# Patient Record
Sex: Male | Born: 1955 | Race: White | Hispanic: No | Marital: Married | Smoking: Never smoker
Health system: Southern US, Community
[De-identification: ages and names within clinical notes are randomized; demographics above are authoritative.]

## PROBLEM LIST (undated history)

## (undated) DIAGNOSIS — F32A Depression, unspecified: Secondary | ICD-10-CM

## (undated) DIAGNOSIS — R011 Cardiac murmur, unspecified: Secondary | ICD-10-CM

## (undated) DIAGNOSIS — F329 Major depressive disorder, single episode, unspecified: Secondary | ICD-10-CM

## (undated) DIAGNOSIS — E785 Hyperlipidemia, unspecified: Secondary | ICD-10-CM

## (undated) DIAGNOSIS — E119 Type 2 diabetes mellitus without complications: Secondary | ICD-10-CM

## (undated) DIAGNOSIS — I10 Essential (primary) hypertension: Secondary | ICD-10-CM

## (undated) DIAGNOSIS — T7840XA Allergy, unspecified, initial encounter: Secondary | ICD-10-CM

## (undated) HISTORY — DX: Essential (primary) hypertension: I10

## (undated) HISTORY — DX: Allergy, unspecified, initial encounter: T78.40XA

## (undated) HISTORY — PX: TONSILLECTOMY: SUR1361

## (undated) HISTORY — DX: Cardiac murmur, unspecified: R01.1

## (undated) HISTORY — DX: Hyperlipidemia, unspecified: E78.5

## (undated) HISTORY — DX: Depression, unspecified: F32.A

## (undated) HISTORY — DX: Major depressive disorder, single episode, unspecified: F32.9

## (undated) HISTORY — PX: OTHER SURGICAL HISTORY: SHX169

---

## 1995-04-05 HISTORY — PX: MENISCUS REPAIR: SHX5179

## 2001-11-23 ENCOUNTER — Encounter: Payer: Self-pay | Admitting: Otolaryngology

## 2001-11-23 ENCOUNTER — Ambulatory Visit (HOSPITAL_COMMUNITY): Admission: RE | Admit: 2001-11-23 | Discharge: 2001-11-23 | Payer: Self-pay | Admitting: Otolaryngology

## 2006-04-26 ENCOUNTER — Ambulatory Visit (HOSPITAL_COMMUNITY): Payer: Self-pay | Admitting: Psychiatry

## 2006-05-02 ENCOUNTER — Ambulatory Visit: Payer: Self-pay | Admitting: Psychiatry

## 2006-05-02 ENCOUNTER — Other Ambulatory Visit (HOSPITAL_COMMUNITY): Admission: RE | Admit: 2006-05-02 | Discharge: 2006-07-31 | Payer: Self-pay | Admitting: Psychiatry

## 2006-05-22 ENCOUNTER — Ambulatory Visit (HOSPITAL_COMMUNITY): Payer: Self-pay | Admitting: Psychiatry

## 2006-06-05 ENCOUNTER — Ambulatory Visit (HOSPITAL_COMMUNITY): Payer: Self-pay | Admitting: Psychiatry

## 2006-07-05 ENCOUNTER — Ambulatory Visit (HOSPITAL_COMMUNITY): Payer: Self-pay | Admitting: Psychiatry

## 2006-10-27 ENCOUNTER — Encounter: Admission: RE | Admit: 2006-10-27 | Discharge: 2006-10-27 | Payer: Self-pay | Admitting: Otolaryngology

## 2011-09-15 ENCOUNTER — Other Ambulatory Visit: Payer: Self-pay | Admitting: Otolaryngology

## 2011-09-15 DIAGNOSIS — H9210 Otorrhea, unspecified ear: Secondary | ICD-10-CM

## 2011-09-15 DIAGNOSIS — H7010 Chronic mastoiditis, unspecified ear: Secondary | ICD-10-CM

## 2011-09-16 ENCOUNTER — Other Ambulatory Visit: Payer: Self-pay

## 2011-09-23 ENCOUNTER — Ambulatory Visit
Admission: RE | Admit: 2011-09-23 | Discharge: 2011-09-23 | Disposition: A | Discharge status: Home / Self Care | Payer: 59 | Source: Ambulatory Visit | Attending: Otolaryngology | Admitting: Otolaryngology

## 2011-09-23 DIAGNOSIS — H7010 Chronic mastoiditis, unspecified ear: Secondary | ICD-10-CM

## 2011-09-23 DIAGNOSIS — H9210 Otorrhea, unspecified ear: Secondary | ICD-10-CM

## 2011-11-10 ENCOUNTER — Other Ambulatory Visit: Payer: Self-pay

## 2012-08-21 ENCOUNTER — Telehealth: Payer: Self-pay | Admitting: *Deleted

## 2012-08-21 NOTE — Telephone Encounter (Signed)
Pt called, has some nasal drainage and congestion, denies fever or sob for 2-3 days. Pt to treat as possible virus, if worsen call back for app in the next day or 2. Pt agrees

## 2013-07-31 IMAGING — CT CT TEMPORAL BONES W/O CM
2 of 5 series · 16 of 30 positions shown, 18 images · non-contrast
Comparison: CT temporal bones 10/27/2006

CLINICAL DATA: Chronic mastoiditis.  Ear surgery.  Decreased
hearing left ear

CT TEMPORAL BONES WITHOUT CONTRAST
TECHNIQUE: Axial and coronal plane CT imaging of the petrous
temporal bones was performed with thin-collimation image
reconstruction.  No intravenous contrast was administered.
Multiplanar CT image reconstructions were also generated.

[Series 3: ax mag right · axial · 0.19mm/px · z∈[-17,+36]mm · 8 of 215 slices shown, 10 images]
[im 24/215  brain]
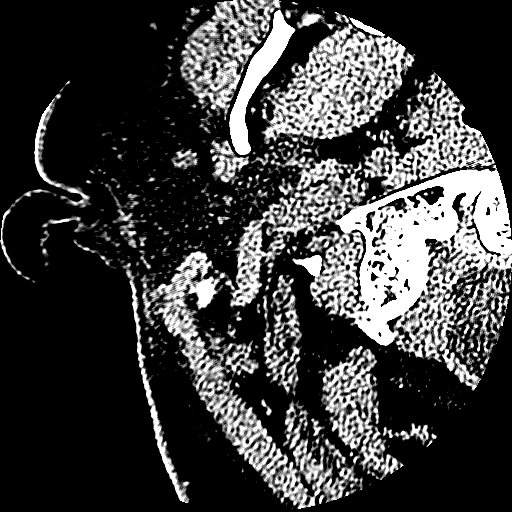
[im 24/215  bone]
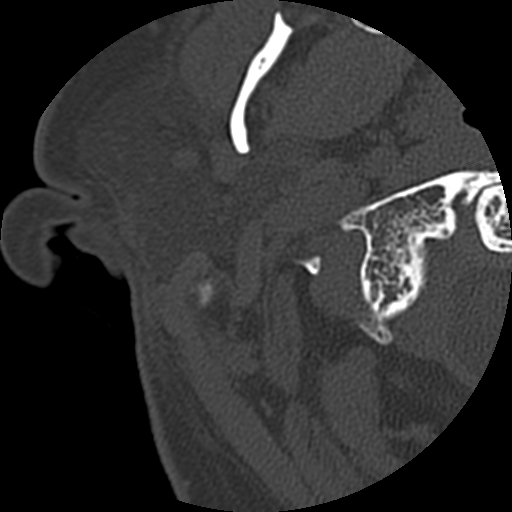
[im 48/215  bone]
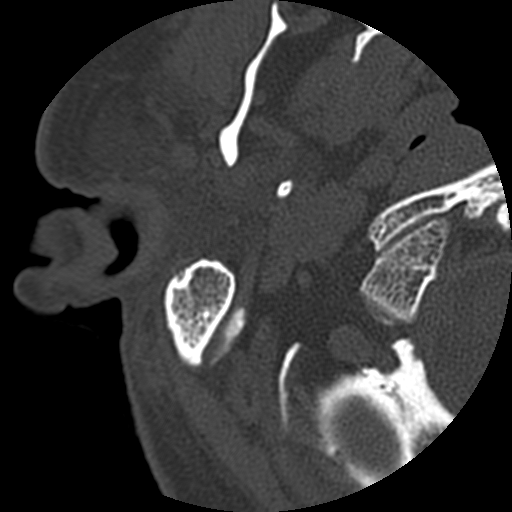
[im 72/215  bone]
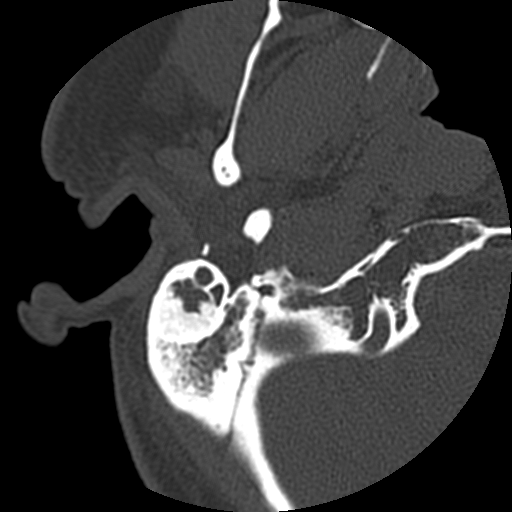
[im 96/215  bone]
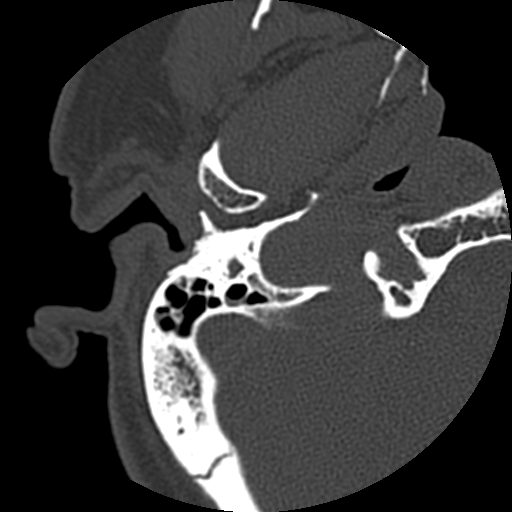
[im 119/215  brain]
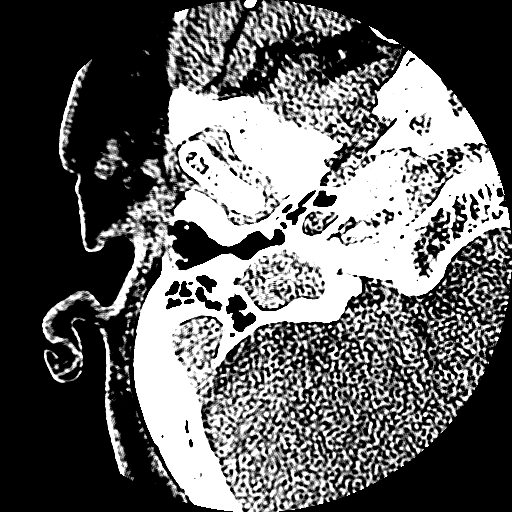
[im 119/215  bone]
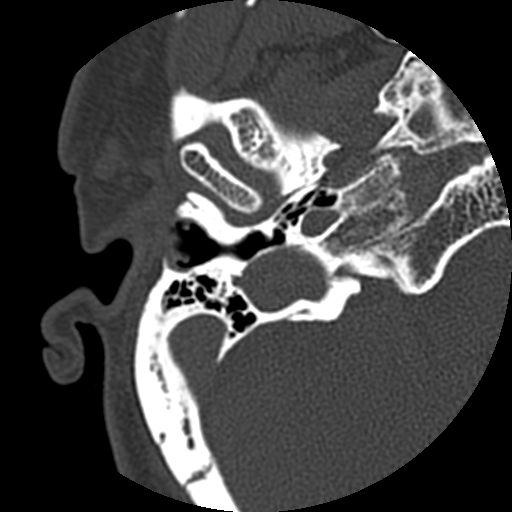
[im 143/215  bone]
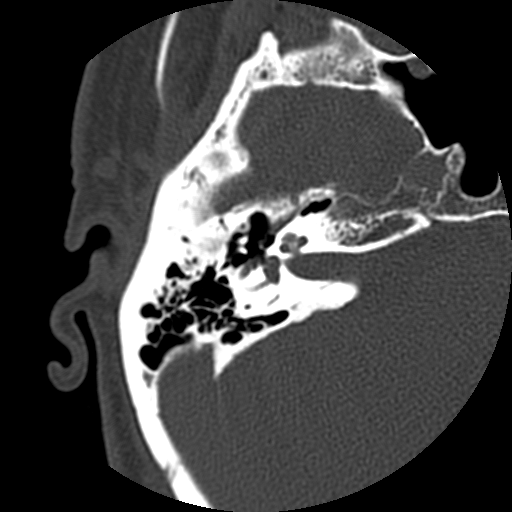
[im 167/215  bone]
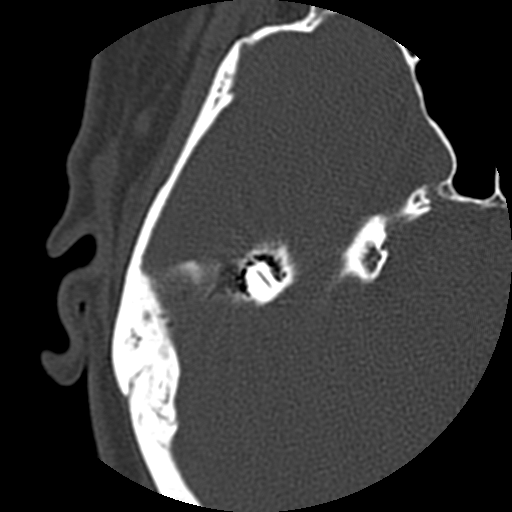
[im 191/215  bone]
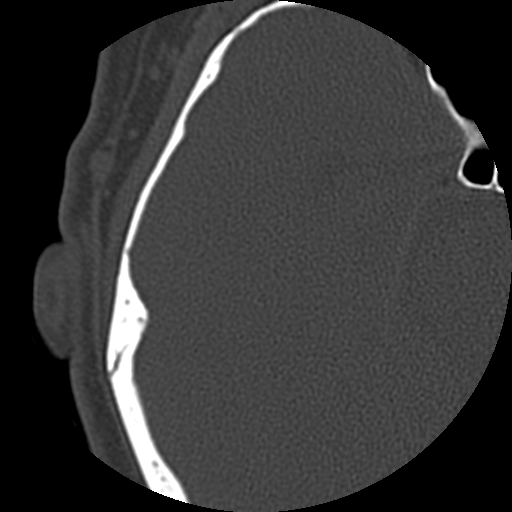

[Series 4: ax mag left · axial · 0.19mm/px · z∈[-17,+36]mm · 8 of 215 slices shown]
[im 24/215  bone]
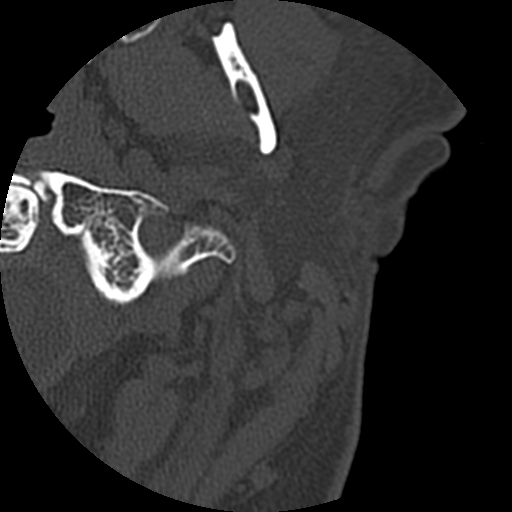
[im 48/215  bone]
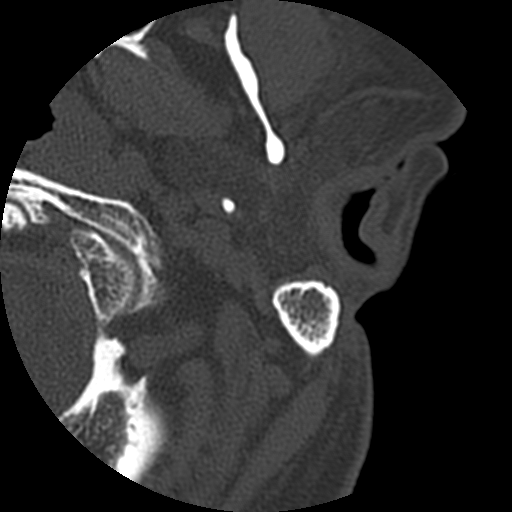
[im 72/215  bone]
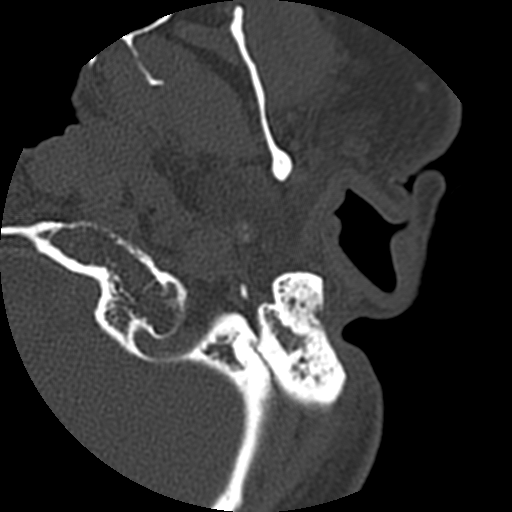
[im 96/215  bone]
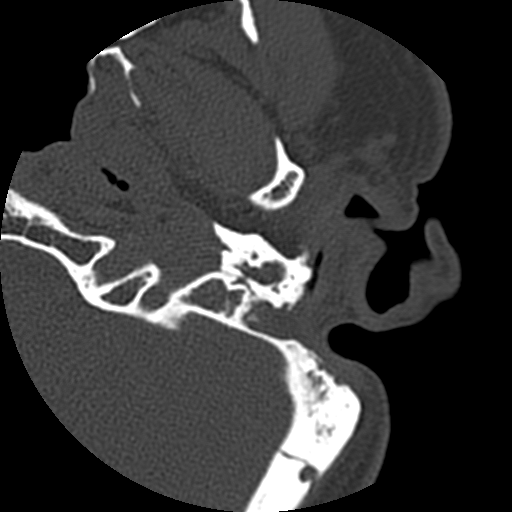
[im 119/215  bone]
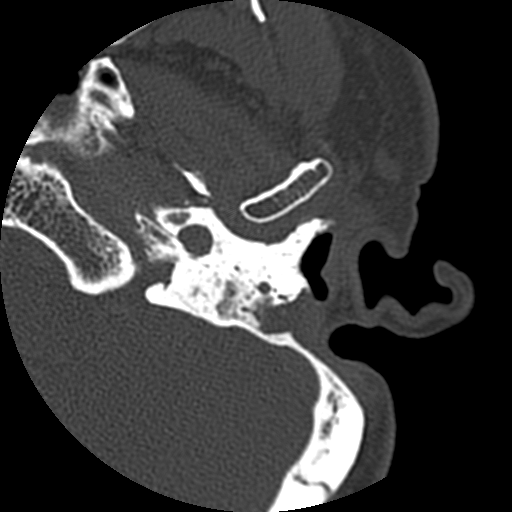
[im 143/215  bone]
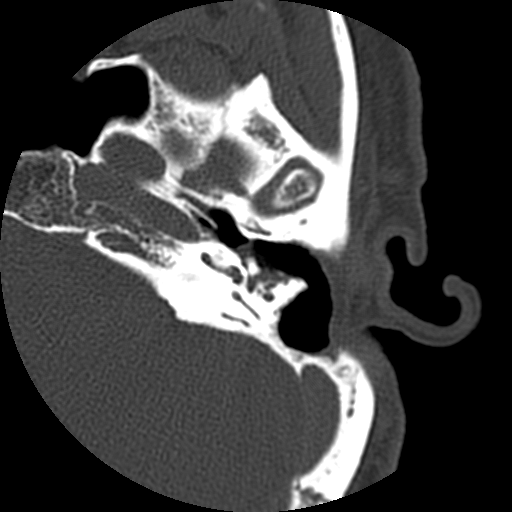
[im 167/215  bone]
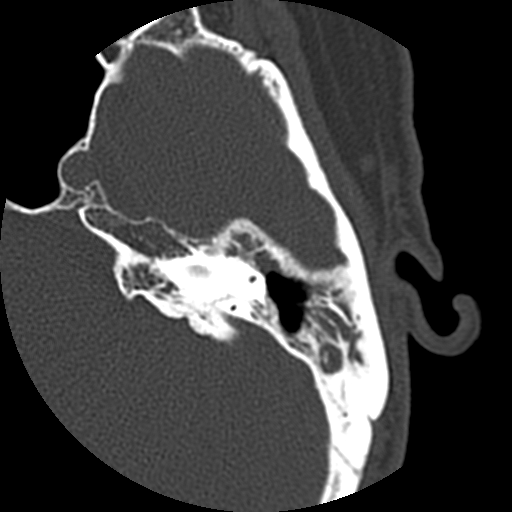
[im 191/215  bone]
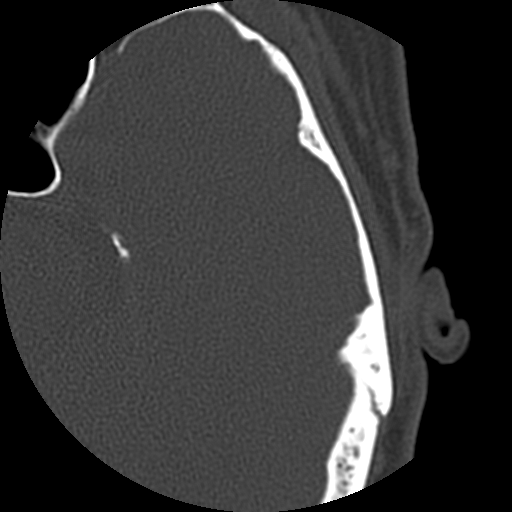

[16 of 30 positions shown; findings below may reference images not displayed]

FINDINGS: Visualized brain appears normal.  Visualized paranasal
sinuses show mild mucosal thickening in the ethmoid sinuses
bilaterally without air-fluid level.

Right temporal bone:  Opacification of air cells in the mastoid tip
on the right is unchanged and chronic.  The remainder of the right
mastoid sinus is well developed and clear.  The middle ear is well-
aerated and clear.  Ossicles are normal on the right.  Internal
auditory canal is normal.  Cochlea and vestibular apparatus is
normal.  Vestibular aqueduct is not enlarged.  No bony erosion.

Left temporal bone:  Interval mastoidectomy on the left.  There is
soft tissue density in the mastoidectomy cavity,  with a lobular
contour. This extends into the medial aspect of the mastoid sinus.
This also fills some of the lateral aspect of the mastoidectomy
cavity.  The mastoid tip is opacified.  No fluid level. Ossicles
are surrounded by soft tissue density and are distorted due to
surgery and inflammatory change.  Cochlea is normal.  Vestibular
apparatus and vestibular aqueduct are normal.
IMPRESSION: Interval left mastoidectomy since the prior CT.  There is soft
tissue filling the roof of the cavity extending medially and
laterally.  This may be chronic inflammatory tissue or
cholesteatoma.  There is opacification of the mastoid tip on the
left.

Mild mucosal thickening in the mastoid tip on the right, unchanged
from prior CT.  Right middle ear is clear.

## 2013-12-24 ENCOUNTER — Ambulatory Visit (INDEPENDENT_AMBULATORY_CARE_PROVIDER_SITE_OTHER): Payer: 59 | Admitting: Family Medicine

## 2013-12-24 ENCOUNTER — Encounter: Payer: Self-pay | Admitting: Family Medicine

## 2013-12-24 VITALS — BP 130/80 | Ht 73.0 in | Wt 230.8 lb

## 2013-12-24 DIAGNOSIS — J310 Chronic rhinitis: Secondary | ICD-10-CM

## 2013-12-24 DIAGNOSIS — J329 Chronic sinusitis, unspecified: Secondary | ICD-10-CM

## 2013-12-24 MED ORDER — AMOXICILLIN 500 MG PO CAPS
500.0000 mg | ORAL_CAPSULE | Freq: Three times a day (TID) | ORAL | Status: DC
Start: 2013-12-24 — End: 2014-06-24

## 2013-12-24 NOTE — Progress Notes (Signed)
   Subjective:    Patient ID: Thomas Summers, male    DOB: 1956-04-03, 58 y.o.   MRN: [id number]  Cough This is a new problem. The current episode started 1 to 4 weeks ago. Associated symptoms include nasal congestion. Associated symptoms comments: Diarrhea, chest tightness. Treatments tried: allegra.    Cold like symptoms drainage and feeling bad  achey and gunky  Diminished energy  No major headaches  Not much cough,, Loose stools   Allegra prn allergies re usually rnny nose an dsneezing this feels different  Review of Systems  Respiratory: Positive for cough.    no vomiting no diarrhea no rash ROS otherwise negative     Objective:   Physical Exam  Alert decent hydration mild malaise. Final stable. Flomax or tenderness pharynx some erythema neck supple. Lungs clear. Heart regular in rhythm.      Assessment & Plan:  Impression a 2 rhinosinusitis plan antibiotics prescribed. Symptomatic care discussed. WSL

## 2014-06-24 ENCOUNTER — Encounter: Payer: Self-pay | Admitting: Family Medicine

## 2014-06-24 ENCOUNTER — Ambulatory Visit (INDEPENDENT_AMBULATORY_CARE_PROVIDER_SITE_OTHER): Payer: 59 | Admitting: Family Medicine

## 2014-06-24 VITALS — BP 158/102 | Ht 73.0 in | Wt 233.0 lb

## 2014-06-24 DIAGNOSIS — Z13220 Encounter for screening for lipoid disorders: Secondary | ICD-10-CM

## 2014-06-24 DIAGNOSIS — Z125 Encounter for screening for malignant neoplasm of prostate: Secondary | ICD-10-CM

## 2014-06-24 DIAGNOSIS — Z79899 Other long term (current) drug therapy: Secondary | ICD-10-CM | POA: Diagnosis not present

## 2014-06-24 DIAGNOSIS — I10 Essential (primary) hypertension: Secondary | ICD-10-CM | POA: Insufficient documentation

## 2014-06-24 MED ORDER — LISINOPRIL 5 MG PO TABS: 5.0000 mg | ORAL_TABLET | Freq: Every day | ORAL | Status: DC

## 2014-06-24 NOTE — Patient Instructions (Addendum)
Dear Patient,  It has been recommended to you that you have a colonoscopy. It is your responsibility to carry through with this recommendation.   Did you realize that colon cancer is the second leading cancer killer in the [place]. One in every 20 adults will get colon cancer. If all adults would go through the recommended screening for colon cancer (getting a colonoscopy), then there would be a 60% reduction in the number of people dying from colon cancer.  Colon cancer just doesn't come out of the blue. It starts off as a small polyp which over time grows into a cancer. A colonoscopy can prevent cancer and in many cases detected when it is at a very treatable phase. Small colon cancers can have cure rates of 95%. Advanced colon cancer, which often occurs in people who do not do their screenings, have cure rates less than 20%. The risk of colon cancer advances with age. Most adults should have regular colonoscopies every 10 years starting at age 74. This recommendation can vary depending on a person's medical history.  Health-care laws now allow for you to call the gastroenterologist office directly in order to set yourself up for this very important tests. Today we have recommended to you that you do this test. This test may save your life. Failure to do this test puts you at risk for premature death from colon cancer. Do the right thing and schedule this test now.  Here as a list of specialists we recommend in the surrounding area. When you call their office let them know that you are a patient of our practice in your interested in doing a screening colonoscopy. They should assist you without problems. You will need the following information when you called them: 1-name of which Dr. you see, 2-your insurance information, 3-a list of medications that you currently take, 4-any allergies you have to medications.  [place] gastroenterologist Dr. Milton Ferguson, Dr Felicie Morn  gastroenterologist   [facility]   [phone]  [facility] gastroenterology (Dr. Garnetta Buddy and [place]) [phone]  [facility] gastroenterology (Dr. Leia Alf, Harrietta Guardian, [place]) [phone]  Each group of specialists has assured [place] that when you called them they will help you get your colonoscopy set up. Should you have problems please let us know. Be sure to call soon. Sincerely, Pearson Forster, Dr Mickie Hillier, Dr.Daran Favaro      DASH Eating Plan DASH stands for "Dietary Approaches to Stop Hypertension." The DASH eating plan is a healthy eating plan that has been shown to reduce high blood pressure (hypertension). Additional health benefits may include reducing the risk of type 2 diabetes mellitus, heart disease, and stroke. The DASH eating plan may also help with weight loss. WHAT DO I NEED TO KNOW ABOUT THE DASH EATING PLAN? For the DASH eating plan, you will follow these general guidelines:  Choose foods with a percent daily value for sodium of less than 5% (as listed on the food label).  Use salt-free seasonings or herbs instead of table salt or sea salt.  Check with your health care provider or pharmacist before using salt substitutes.  Eat lower-sodium products, often labeled as "lower sodium" or "no salt added."  Eat fresh foods.  Eat more vegetables, fruits, and low-fat dairy products.  Choose whole grains. Look for the word "whole" as the first word in the ingredient list.  Choose fish and skinless chicken or  [place] more often than red meat. Limit fish, poultry, and meat to 6 oz (170 g) each day.  Limit sweets, desserts, sugars, and sugary drinks.  Choose heart-healthy fats.  Limit cheese to 1 oz (28 g) per day.  Eat more home-cooked food and less restaurant, buffet, and fast food.  Limit fried foods.  Cook foods using methods other  than frying.  Limit canned vegetables. If you do use them, rinse them well to decrease the sodium.  When eating at a restaurant, ask that your food be prepared with less salt, or no salt if possible. WHAT FOODS CAN I EAT? Seek help from a dietitian for individual calorie needs. Grains Whole grain or whole wheat bread. Brown rice. Whole grain or whole wheat pasta. Quinoa, bulgur, and whole grain cereals. Low-sodium cereals. Corn or whole wheat flour tortillas. Whole grain cornbread. Whole grain crackers. Low-sodium crackers. Vegetables Fresh or frozen vegetables (raw, steamed, roasted, or grilled). Low-sodium or reduced-sodium tomato and vegetable juices. Low-sodium or reduced-sodium tomato sauce and paste. Low-sodium or reduced-sodium canned vegetables.  Fruits All fresh, canned (in natural juice), or frozen fruits. Meat and Other Protein Products Ground beef (85% or leaner), grass-fed beef, or beef trimmed of fat. Skinless chicken or [place]. Ground chicken or [place]. Pork trimmed of fat. All fish and seafood. Eggs. Dried beans, peas, or lentils. Unsalted nuts and seeds. Unsalted canned beans. Dairy Low-fat dairy products, such as skim or 1% milk, 2% or reduced-fat cheeses, low-fat ricotta or cottage cheese, or plain low-fat yogurt. Low-sodium or reduced-sodium cheeses. Fats and Oils Tub margarines without trans fats. Light or reduced-fat mayonnaise and salad dressings (reduced sodium). Avocado. Safflower, olive, or canola oils. Natural peanut or almond butter. Other Unsalted popcorn and pretzels. The items listed above may not be a complete list of recommended foods or beverages. Contact your dietitian for more options. WHAT FOODS ARE NOT RECOMMENDED? Grains White bread. White pasta. White rice. Refined cornbread. Bagels and croissants. Crackers that contain trans fat. Vegetables Creamed or fried vegetables. Vegetables in a cheese sauce. Regular canned vegetables. Regular canned tomato  sauce and paste. Regular tomato and vegetable juices. Fruits Dried fruits. Canned fruit in light or heavy syrup. Fruit juice. Meat and Other Protein Products Fatty cuts of meat. Ribs, chicken wings, bacon, sausage, bologna, salami, chitterlings, fatback, hot dogs, bratwurst, and packaged luncheon meats. Salted nuts and seeds. Canned beans with salt. Dairy Whole or 2% milk, cream, half-and-half, and cream cheese. Whole-fat or sweetened yogurt. Full-fat cheeses or blue cheese. Nondairy creamers and whipped toppings. Processed cheese, cheese spreads, or cheese curds. Condiments Onion and garlic salt, seasoned salt, table salt, and sea salt. Canned and packaged gravies. Worcestershire sauce. Tartar sauce. Barbecue sauce. Teriyaki sauce. Soy sauce, including reduced sodium. Steak sauce. Fish sauce. Oyster sauce. Cocktail sauce. Horseradish. Ketchup and mustard. Meat flavorings and tenderizers. Bouillon cubes. Hot sauce. Tabasco sauce. Marinades. Taco seasonings. Relishes. Fats and Oils Butter, stick margarine, lard, shortening, ghee, and bacon fat. Coconut, palm kernel, or palm oils. Regular salad dressings. Other Pickles and olives. Salted popcorn and pretzels. The items listed above may not be a complete list of foods and beverages to avoid. Contact your dietitian for more information. WHERE CAN I FIND MORE INFORMATION? National Heart, Lung, and Blood Institute: travelstabloid.com Document Released: 03/10/2011 Document Revised: 08/05/2013 Document Reviewed: 01/23/2013 [facility] Patient Information 2015 [place], [place]. This information is not intended to replace advice given to you by your health care provider. Make sure you discuss any questions you have  with your health care provider.   Hypertension Hypertension, commonly called high blood pressure, is when the force of blood pumping through your arteries is too strong. Your arteries are the blood vessels that  carry blood from your heart throughout your body. A blood pressure reading consists of a higher number over a lower number, such as 110/72. The higher number (systolic) is the pressure inside your arteries when your heart pumps. The lower number (diastolic) is the pressure inside your arteries when your heart relaxes. Ideally you want your blood pressure below 120/80. Hypertension forces your heart to work harder to pump blood. Your arteries may become narrow or stiff. Having hypertension puts you at risk for heart disease, stroke, and other problems.  RISK FACTORS Some risk factors for high blood pressure are controllable. Others are not.  Risk factors you cannot control include:   Race. You may be at higher risk if you are African American.  Age. Risk increases with age.  Gender. Men are at higher risk than women before age 83 years. After age 54, women are at higher risk than men. Risk factors you can control include:  Not getting enough exercise or physical activity.  Being overweight.  Getting too much fat, sugar, calories, or salt in your diet.  Drinking too much alcohol. SIGNS AND SYMPTOMS Hypertension does not usually cause signs or symptoms. Extremely high blood pressure (hypertensive crisis) may cause headache, anxiety, shortness of breath, and nosebleed. DIAGNOSIS  To check if you have hypertension, your health care provider will measure your blood pressure while you are seated, with your arm held at the level of your heart. It should be measured at least twice using the same arm. Certain conditions can cause a difference in blood pressure between your right and left arms. A blood pressure reading that is higher than normal on one occasion does not mean that you need treatment. If one blood pressure reading is high, ask your health care provider about having it checked again. TREATMENT  Treating high blood pressure includes making lifestyle changes and possibly taking medicine.  Living a healthy lifestyle can help lower high blood pressure. You may need to change some of your habits. Lifestyle changes may include:  Following the DASH diet. This diet is high in fruits, vegetables, and whole grains. It is low in salt, red meat, and added sugars.  Getting at least 2 hours of brisk physical activity every week.  Losing weight if necessary.  Not smoking.  Limiting alcoholic beverages.  Learning ways to reduce stress. If lifestyle changes are not enough to get your blood pressure under control, your health care provider may prescribe medicine. You may need to take more than one. Work closely with your health care provider to understand the risks and benefits. HOME CARE INSTRUCTIONS  Have your blood pressure rechecked as directed by your health care provider.   Take medicines only as directed by your health care provider. Follow the directions carefully. Blood pressure medicines must be taken as prescribed. The medicine does not work as well when you skip doses. Skipping doses also puts you at risk for problems.   Do not smoke.   Monitor your blood pressure at home as directed by your health care provider. SEEK MEDICAL CARE IF:   You think you are having a reaction to medicines taken.  You have recurrent headaches or feel dizzy.  You have swelling in your ankles.  You have trouble with your vision. SEEK IMMEDIATE MEDICAL CARE IF:  You develop a severe headache or confusion.  You have unusual weakness, numbness, or feel faint.  You have severe chest or abdominal pain.  You vomit repeatedly.  You have trouble breathing. MAKE SURE YOU:   Understand these instructions.  Will watch your condition.  Will get help right away if you are not doing well or get worse. Document Released: 03/21/2005 Document Revised: 08/05/2013 Document Reviewed: 01/11/2013 [facility] Patient Information 2015 [place], [place]. This information is not intended to replace  advice given to you by your health care provider. Make sure you discuss any questions you have with your health care provider.

## 2014-06-24 NOTE — Progress Notes (Signed)
   Subjective:    Patient ID: FRANCOIS ELK, male    DOB: [birth year], 59 y.o.   MRN: [id number]  Hypertension This is a new problem. The current episode started more than 1 year ago. Pertinent negatives include no chest pain, headaches or shortness of breath. Risk factors for coronary artery disease include male gender, obesity, sedentary lifestyle and family history. Past treatments include ACE inhibitors and diuretics. The current treatment provides mild improvement. Compliance problems include diet.  There is no history of angina, CAD/MI or heart failure.  not currently taking any bp meds. Pt states he was on bp med in the past.   Sinus drainage. Started today. No mucoid drainage no fever no chills    Review of Systems  Respiratory: Negative for shortness of breath.   Cardiovascular: Negative for chest pain.  Neurological: Negative for headaches.       Objective:   Physical Exam  Constitutional: He appears well-nourished. No distress.  Cardiovascular: Normal rate, regular rhythm and normal heart sounds.   No murmur heard. Pulmonary/Chest: Effort normal and breath sounds normal. No respiratory distress.  Musculoskeletal: He exhibits no edema.  Lymphadenopathy:    He has no cervical adenopathy.  Neurological: He is alert.  Psychiatric: His behavior is normal.  Vitals reviewed.   I believe patient has viral your eye should gradually get better over the next several days      Assessment & Plan:  HTN-subpar control-patient did not do a good job of following through. I believe he try to keep things under control with diet and activity but obviously this is not been good enough. It would be in this patient's best interest to go ahead and start on medication. Lisinopril 5 mg daily. Also recommend for this patient follow-up in 4-6 weeks time to see how this medication is doing may need adjustment.  Lab work and wellness visit in 4-6 weeks Colonoscopy recommended

## 2014-06-26 LAB — BASIC METABOLIC PANEL
BUN / CREAT RATIO: 14 (ref 9–20)
BUN: 13 mg/dL (ref 6–24)
CHLORIDE: 101 mmol/L (ref 97–108)
CO2: 24 mmol/L (ref 18–29)
Calcium: 9.1 mg/dL (ref 8.7–10.2)
Creatinine, Ser: 0.91 mg/dL (ref 0.76–1.27)
GFR calc non Af Amer: 93 mL/min/{1.73_m2} (ref >59)
GFR, EST AFRICAN AMERICAN: 107 mL/min/{1.73_m2} (ref >59)
Glucose: 96 mg/dL (ref 65–99)
Potassium: 4.5 mmol/L (ref 3.5–5.2)
Sodium: 141 mmol/L (ref 134–144)

## 2014-06-26 LAB — LIPID PANEL
Chol/HDL Ratio: 3.8 ratio units (ref 0.0–5.0)
Cholesterol, Total: 201 mg/dL — ABNORMAL HIGH (ref 100–199)
HDL: 53 mg/dL (ref >39)
LDL Calculated: 125 mg/dL — ABNORMAL HIGH (ref 0–99)
Triglycerides: 116 mg/dL (ref 0–149)
VLDL Cholesterol Cal: 23 mg/dL (ref 5–40)

## 2014-06-26 LAB — HEPATIC FUNCTION PANEL
ALK PHOS: 106 IU/L (ref 39–117)
ALT: 173 IU/L — ABNORMAL HIGH (ref 0–44)
AST: 69 IU/L — ABNORMAL HIGH (ref 0–40)
Albumin: 4.7 g/dL (ref 3.5–5.5)
BILIRUBIN, DIRECT: 0.25 mg/dL (ref 0.00–0.40)
Bilirubin Total: 0.9 mg/dL (ref 0.0–1.2)
Total Protein: 7 g/dL (ref 6.0–8.5)

## 2014-06-26 LAB — PSA: PSA: 1.9 ng/mL (ref 0.0–4.0)

## 2014-07-03 NOTE — Progress Notes (Signed)
Patient notified and verbalized understanding of the test results. No further questions. 

## 2014-07-22 ENCOUNTER — Encounter: Payer: Self-pay | Admitting: Family Medicine

## 2014-07-22 ENCOUNTER — Ambulatory Visit (INDEPENDENT_AMBULATORY_CARE_PROVIDER_SITE_OTHER): Payer: 59 | Admitting: Family Medicine

## 2014-07-22 VITALS — BP 134/80 | Ht 73.0 in | Wt 229.4 lb

## 2014-07-22 DIAGNOSIS — I10 Essential (primary) hypertension: Secondary | ICD-10-CM | POA: Diagnosis not present

## 2014-07-22 DIAGNOSIS — Z0000 Encounter for general adult medical examination without abnormal findings: Secondary | ICD-10-CM | POA: Diagnosis not present

## 2014-07-22 DIAGNOSIS — R748 Abnormal levels of other serum enzymes: Secondary | ICD-10-CM

## 2014-07-22 DIAGNOSIS — J209 Acute bronchitis, unspecified: Secondary | ICD-10-CM | POA: Diagnosis not present

## 2014-07-22 MED ORDER — AZITHROMYCIN 250 MG PO TABS: ORAL_TABLET | ORAL | Status: DC

## 2014-07-22 NOTE — Progress Notes (Signed)
Subjective:    Patient ID: Thomas Summers, male    DOB: 1955/05/18, 59 y.o.   MRN: [id number]  HPI The patient comes in today for a wellness visit.    A review of their health history was completed.  A review of medications was also completed.  Any needed refills: none  Eating habits: not good   Falls/  MVA accidents in past few months: none   Regular exercise: walking  Specialist pt sees on regular basis: none  Preventative health issues were discussed.   Additional concerns: Patient states that he has had some congestion and shortness of breath. This has been present for about 2 weeks now.  Eyes irritated and stuffy and some occas cough   No fam hx of colon ca or prost ca  Gets cong and drainage at times       Results for orders placed or performed in visit on 06/24/14  Lipid panel  Result Value Ref Range   Cholesterol, Total 201 (H) 100 - 199 mg/dL   Triglycerides 116 0 - 149 mg/dL   HDL 53 >39 mg/dL   VLDL Cholesterol Cal 23 5 - 40 mg/dL   LDL Calculated 125 (H) 0 - 99 mg/dL   Chol/HDL Ratio 3.8 0.0 - 5.0 ratio units  Hepatic function panel  Result Value Ref Range   Total Protein 7.0 6.0 - 8.5 g/dL   Albumin 4.7 3.5 - 5.5 g/dL   Bilirubin Total 0.9 0.0 - 1.2 mg/dL   Bilirubin, Direct 0.25 0.00 - 0.40 mg/dL   Alkaline Phosphatase 106 39 - 117 IU/L   AST 69 (H) 0 - 40 IU/L   ALT 173 (H) 0 - 44 IU/L  Basic metabolic panel  Result Value Ref Range   Glucose 96 65 - 99 mg/dL   BUN 13 6 - 24 mg/dL   Creatinine, Ser 0.91 0.76 - 1.27 mg/dL   GFR calc non Af Amer 93 >59 mL/min/1.73   GFR calc Af Amer 107 >59 mL/min/1.73   BUN/Creatinine Ratio 14 9 - 20   Sodium 141 134 - 144 mmol/L   Potassium 4.5 3.5 - 5.2 mmol/L   Chloride 101 97 - 108 mmol/L   CO2 24 18 - 29 mmol/L   Calcium 9.1 8.7 - 10.2 mg/dL  PSA  Result Value Ref Range   PSA 1.9 0.0 - 4.0 ng/mL    Review of Systems  Constitutional: Negative for fever, activity change and appetite change.   HENT: Negative for congestion and rhinorrhea.   Eyes: Negative for discharge.  Respiratory: Negative for cough and wheezing.   Cardiovascular: Negative for chest pain.  Gastrointestinal: Negative for vomiting, abdominal pain and blood in stool.  Genitourinary: Negative for frequency and difficulty urinating.  Musculoskeletal: Negative for neck pain.  Skin: Negative for rash.  Allergic/Immunologic: Negative for environmental allergies and food allergies.  Neurological: Negative for weakness and headaches.  Psychiatric/Behavioral: Negative for agitation.  All other systems reviewed and are negative.      Objective:   Physical Exam  Constitutional: He appears well-developed and well-nourished.  HENT:  Head: Normocephalic and atraumatic.  Right Ear: External ear normal.  Left Ear: External ear normal.  Nose: Nose normal.  Mouth/Throat: Oropharynx is clear and moist.  Eyes: EOM are normal. Pupils are equal, round, and reactive to light.  Neck: Normal range of motion. Neck supple. No thyromegaly present.  Cardiovascular: Normal rate, regular rhythm and normal heart sounds.   No murmur heard. Pulmonary/Chest: Effort  normal and breath sounds normal. No respiratory distress. He has no wheezes.  Abdominal: Soft. Bowel sounds are normal. He exhibits no distension and no mass. There is no tenderness.  Genitourinary: Penis normal.  Prostate within normal limits  Musculoskeletal: Normal range of motion. He exhibits no edema.  Lymphadenopathy:    He has no cervical adenopathy.  Neurological: He is alert. He exhibits normal muscle tone.  Skin: Skin is warm and dry. No erythema.  Psychiatric: He has a normal mood and affect. His behavior is normal. Judgment normal.  Vitals reviewed.   Bronchial cough during exam. Blood pressure good on repeat.      Assessment & Plan:  Impression wellness exam #2 hypertension good control #3 elevated liver enzymes discussed #4 subacute bronchitis plan  antibiotics prescribed. Blood pressure medication refill. Diet exercise discussed. Recheck in 6 months. Appropriate blood work for elevated liver enzymes. May well need ultrasound discussed at length plan diet discussed exercise discussed strongly encouraged colonoscopy. Given colonoscopy sheet patient to call. Follow-up in 6 months. WSL

## 2014-07-23 LAB — HEPATITIS C ANTIBODY: Hep C Virus Ab: <0.1 s/co ratio (ref 0.0–0.9)

## 2014-07-23 LAB — FERRITIN: Ferritin: 596 ng/mL — ABNORMAL HIGH (ref 30–400)

## 2014-07-23 LAB — HEPATITIS B SURFACE ANTIGEN: HEP B S AG: NEGATIVE

## 2014-07-30 NOTE — Addendum Note (Signed)
Addended by: Carmelina Noun on: 07/30/2014 06:24 PM   Modules accepted: Orders

## 2014-08-05 ENCOUNTER — Ambulatory Visit (HOSPITAL_COMMUNITY)
Admission: RE | Admit: 2014-08-05 | Discharge: 2014-08-05 | Disposition: A | Discharge status: Home / Self Care | Payer: 59 | Source: Ambulatory Visit | Attending: Family Medicine | Admitting: Family Medicine

## 2014-08-05 DIAGNOSIS — R7989 Other specified abnormal findings of blood chemistry: Secondary | ICD-10-CM | POA: Insufficient documentation

## 2014-08-05 DIAGNOSIS — R932 Abnormal findings on diagnostic imaging of liver and biliary tract: Secondary | ICD-10-CM | POA: Insufficient documentation

## 2015-01-21 ENCOUNTER — Ambulatory Visit: Payer: 59 | Admitting: Family Medicine

## 2015-02-27 ENCOUNTER — Other Ambulatory Visit: Payer: Self-pay | Admitting: Family Medicine

## 2015-03-26 ENCOUNTER — Other Ambulatory Visit: Payer: Self-pay | Admitting: Family Medicine

## 2015-04-30 ENCOUNTER — Other Ambulatory Visit: Payer: Self-pay | Admitting: Family Medicine

## 2015-05-13 ENCOUNTER — Other Ambulatory Visit: Payer: Self-pay | Admitting: Family Medicine

## 2015-05-20 ENCOUNTER — Other Ambulatory Visit: Payer: Self-pay | Admitting: Family Medicine

## 2015-06-01 ENCOUNTER — Telehealth: Payer: Self-pay | Admitting: Family Medicine

## 2015-06-01 DIAGNOSIS — I10 Essential (primary) hypertension: Secondary | ICD-10-CM

## 2015-06-01 DIAGNOSIS — Z131 Encounter for screening for diabetes mellitus: Secondary | ICD-10-CM

## 2015-06-01 DIAGNOSIS — Z79899 Other long term (current) drug therapy: Secondary | ICD-10-CM

## 2015-06-01 DIAGNOSIS — Z1211 Encounter for screening for malignant neoplasm of colon: Secondary | ICD-10-CM

## 2015-06-01 MED ORDER — LISINOPRIL 5 MG PO TABS: 5.0000 mg | ORAL_TABLET | Freq: Every day | ORAL | Status: DC

## 2015-06-01 NOTE — Telephone Encounter (Signed)
Referral in system. Patient notified.

## 2015-06-01 NOTE — Telephone Encounter (Signed)
Refill sent to pharmacy and patient was notified.

## 2015-06-01 NOTE — Telephone Encounter (Signed)
Patient needs a referral for [facility] for his screening colonoscopy.

## 2015-06-01 NOTE — Telephone Encounter (Signed)
Needing blood work ordered for upcoming physical.  Also, calling to see if he can have enough lisinopril (PRINIVIL,ZESTRIL) 5 MG tablet called in to last until his appointment on 07/27/15.  [place]

## 2015-06-01 NOTE — Telephone Encounter (Signed)
Patient notified blood work has been ordered.

## 2015-06-01 NOTE — Telephone Encounter (Signed)
Ok, lip liv m7 A1c

## 2015-06-02 ENCOUNTER — Encounter: Payer: Self-pay | Admitting: Family Medicine

## 2015-06-03 ENCOUNTER — Telehealth: Payer: Self-pay | Admitting: Family Medicine

## 2015-06-03 NOTE — Telephone Encounter (Signed)
error 

## 2015-06-04 ENCOUNTER — Other Ambulatory Visit (INDEPENDENT_AMBULATORY_CARE_PROVIDER_SITE_OTHER): Payer: Self-pay | Admitting: *Deleted

## 2015-06-04 DIAGNOSIS — Z1211 Encounter for screening for malignant neoplasm of colon: Secondary | ICD-10-CM

## 2015-06-04 LAB — LIPID PANEL
Chol/HDL Ratio: 4.2 ratio units (ref 0.0–5.0)
Cholesterol, Total: 220 mg/dL — ABNORMAL HIGH (ref 100–199)
HDL: 52 mg/dL (ref >39)
LDL Calculated: 146 mg/dL — ABNORMAL HIGH (ref 0–99)
Triglycerides: 112 mg/dL (ref 0–149)
VLDL Cholesterol Cal: 22 mg/dL (ref 5–40)

## 2015-06-04 LAB — BASIC METABOLIC PANEL
BUN / CREAT RATIO: 16 (ref 9–20)
BUN: 12 mg/dL (ref 6–24)
CO2: 24 mmol/L (ref 18–29)
CREATININE: 0.77 mg/dL (ref 0.76–1.27)
Calcium: 8.9 mg/dL (ref 8.7–10.2)
Chloride: 99 mmol/L (ref 96–106)
GFR, EST AFRICAN AMERICAN: 115 mL/min/{1.73_m2} (ref >59)
GFR, EST NON AFRICAN AMERICAN: 99 mL/min/{1.73_m2} (ref >59)
GLUCOSE: 120 mg/dL — AB (ref 65–99)
Potassium: 4 mmol/L (ref 3.5–5.2)
SODIUM: 138 mmol/L (ref 134–144)

## 2015-06-04 LAB — HEPATIC FUNCTION PANEL
ALK PHOS: 99 IU/L (ref 39–117)
ALT: 194 IU/L — ABNORMAL HIGH (ref 0–44)
AST: 74 IU/L — ABNORMAL HIGH (ref 0–40)
Albumin: 4.3 g/dL (ref 3.5–5.5)
BILIRUBIN TOTAL: 0.8 mg/dL (ref 0.0–1.2)
BILIRUBIN, DIRECT: 0.25 mg/dL (ref 0.00–0.40)
TOTAL PROTEIN: 6.8 g/dL (ref 6.0–8.5)

## 2015-06-04 LAB — HEMOGLOBIN A1C
Est. average glucose Bld gHb Est-mCnc: 166 mg/dL
Hgb A1c MFr Bld: 7.4 % — ABNORMAL HIGH (ref 4.8–5.6)

## 2015-06-17 ENCOUNTER — Encounter: Payer: Self-pay | Admitting: Family Medicine

## 2015-06-17 ENCOUNTER — Ambulatory Visit (INDEPENDENT_AMBULATORY_CARE_PROVIDER_SITE_OTHER): Payer: 59 | Admitting: Family Medicine

## 2015-06-17 VITALS — BP 132/86 | Ht 73.5 in | Wt 234.0 lb

## 2015-06-17 DIAGNOSIS — Z125 Encounter for screening for malignant neoplasm of prostate: Secondary | ICD-10-CM

## 2015-06-17 DIAGNOSIS — E119 Type 2 diabetes mellitus without complications: Secondary | ICD-10-CM | POA: Diagnosis not present

## 2015-06-17 DIAGNOSIS — R7989 Other specified abnormal findings of blood chemistry: Secondary | ICD-10-CM | POA: Diagnosis not present

## 2015-06-17 DIAGNOSIS — Z0000 Encounter for general adult medical examination without abnormal findings: Secondary | ICD-10-CM

## 2015-06-17 MED ORDER — LISINOPRIL 5 MG PO TABS: 5.0000 mg | ORAL_TABLET | Freq: Every day | ORAL | Status: DC

## 2015-06-17 NOTE — Patient Instructions (Addendum)
Results for orders placed or performed in visit on 06/01/15  Lipid panel  Result Value Ref Range   Cholesterol, Total 220 (H) 100 - 199 mg/dL   Triglycerides 112 0 - 149 mg/dL   HDL 52 >39 mg/dL   VLDL Cholesterol Cal 22 5 - 40 mg/dL   LDL Calculated 146 (H) 0 - 99 mg/dL   Chol/HDL Ratio 4.2 0.0 - 5.0 ratio units  Hepatic function panel  Result Value Ref Range   Total Protein 6.8 6.0 - 8.5 g/dL   Albumin 4.3 3.5 - 5.5 g/dL   Bilirubin Total 0.8 0.0 - 1.2 mg/dL   Bilirubin, Direct 0.25 0.00 - 0.40 mg/dL   Alkaline Phosphatase 99 39 - 117 IU/L   AST 74 (H) 0 - 40 IU/L   ALT 194 (H) 0 - 44 IU/L  Basic metabolic panel  Result Value Ref Range   Glucose 120 (H) 65 - 99 mg/dL   BUN 12 6 - 24 mg/dL   Creatinine, Ser 0.77 0.76 - 1.27 mg/dL   GFR calc non Af Amer 99 >59 mL/min/1.73   GFR calc Af Amer 115 >59 mL/min/1.73   BUN/Creatinine Ratio 16 9 - 20   Sodium 138 134 - 144 mmol/L   Potassium 4.0 3.5 - 5.2 mmol/L   Chloride 99 96 - 106 mmol/L   CO2 24 18 - 29 mmol/L   Calcium 8.9 8.7 - 10.2 mg/dL  Hemoglobin A1c  Result Value Ref Range   Hgb A1c MFr Bld 7.4 (H) 4.8 - 5.6 %   Est. average glucose Bld gHb Est-mCnc 166 mg/dL     Type 2 Diabetes Mellitus, Adult Type 2 diabetes mellitus, often simply referred to as type 2 diabetes, is a long-lasting (chronic) disease. In type 2 diabetes, the pancreas does not make enough insulin (a hormone), the cells are less responsive to the insulin that is made (insulin resistance), or both. Normally, insulin moves sugars from food into the tissue cells. The tissue cells use the sugars for energy. The lack of insulin or the lack of normal response to insulin causes excess sugars to build up in the blood instead of going into the tissue cells. As a result, high blood sugar (hyperglycemia) develops. The effect of high sugar (glucose) levels can cause many complications. Type 2 diabetes was also previously called adult-onset diabetes, but it can occur at  any age.  RISK FACTORS  A person is predisposed to developing type 2 diabetes if someone in the family has the disease and also has one or more of the following primary risk factors: Weight gain, or being overweight or obese. An inactive lifestyle. A history of consistently eating high-calorie foods. Maintaining a normal weight and regular physical activity can reduce the chance of developing type 2 diabetes. SYMPTOMS  A person with type 2 diabetes may not show symptoms initially. The symptoms of type 2 diabetes appear slowly. The symptoms include: Increased thirst (polydipsia). Increased urination (polyuria). Increased urination during the night (nocturia). Sudden or unexplained weight changes. Frequent, recurring infections. Tiredness (fatigue). Weakness. Vision changes, such as blurred vision. Fruity smell to your breath. Abdominal pain. Nausea or vomiting. Cuts or bruises which are slow to heal. Tingling or numbness in the hands or feet. An open skin wound (ulcer). DIAGNOSIS Type 2 diabetes is frequently not diagnosed until complications of diabetes are present. Type 2 diabetes is diagnosed when symptoms or complications are present and when blood glucose levels are increased. Your blood glucose level may be  checked by one or more of the following blood tests: A fasting blood glucose test. You will not be allowed to eat for at least 8 hours before a blood sample is taken. A random blood glucose test. Your blood glucose is checked at any time of the day regardless of when you ate. A hemoglobin A1c blood glucose test. A hemoglobin A1c test provides information about blood glucose control over the previous 3 months. An oral glucose tolerance test (OGTT). Your blood glucose is measured after you have not eaten (fasted) for 2 hours and then after you drink a glucose-containing beverage. TREATMENT  You may need to take insulin or diabetes medicine daily to keep blood glucose levels in  the desired range. If you use insulin, you may need to adjust the dosage depending on the carbohydrates that you eat with each meal or snack. Lifestyle changes are recommended as part of your treatment. These may include: Following an individualized diet plan developed by a nutritionist or dietitian. Exercising daily. Your health care providers will set individualized treatment goals for you based on your age, your medicines, how long you have had diabetes, and any other medical conditions you have. Generally, the goal of treatment is to maintain the following blood glucose levels: Before meals (preprandial): 80-130 mg/dL. After meals (postprandial): below 180 mg/dL. A1c: less than 6.5-7%. HOME CARE INSTRUCTIONS  Have your hemoglobin A1c level checked twice a year. Perform daily blood glucose monitoring as directed by your health care provider. Monitor urine ketones when you are ill and as directed by your health care provider. Take your diabetes medicine or insulin as directed by your health care provider to maintain your blood glucose levels in the desired range. Never run out of diabetes medicine or insulin. It is needed every day. If you are using insulin, you may need to adjust the amount of insulin given based on your intake of carbohydrates. Carbohydrates can raise blood glucose levels but need to be included in your diet. Carbohydrates provide vitamins, minerals, and fiber which are an essential part of a healthy diet. Carbohydrates are found in fruits, vegetables, whole grains, dairy products, legumes, and foods containing added sugars. Eat healthy foods. You should make an appointment to see a registered dietitian to help you create an eating plan that is right for you. Lose weight if you are overweight. Carry a medical alert card or wear your medical alert jewelry. Carry a 15-gram carbohydrate snack with you at all times to treat low blood glucose (hypoglycemia). Some examples of  15-gram carbohydrate snacks include: Glucose tablets, 3 or 4. Glucose gel, 15-gram tube. Raisins, 2 tablespoons (24 grams). Jelly beans, 6. Animal crackers, 8. Regular pop, 4 ounces (120 mL). Gummy treats, 9. Recognize hypoglycemia. Hypoglycemia occurs with blood glucose levels of 70 mg/dL and below. The risk for hypoglycemia increases when fasting or skipping meals, during or after intense exercise, and during sleep. Hypoglycemia symptoms can include: Tremors or shakes. Decreased ability to concentrate. Sweating. Increased heart rate. Headache. Dry mouth. Hunger. Irritability. Anxiety. Restless sleep. Altered speech or coordination. Confusion. Treat hypoglycemia promptly. If you are alert and able to safely swallow, follow the 15:15 rule: Take 15-20 grams of rapid-acting glucose or carbohydrate. Rapid-acting options include glucose gel, glucose tablets, or 4 ounces (120 mL) of fruit juice, regular soda, or low-fat milk. Check your blood glucose level 15 minutes after taking the glucose. Take 15-20 grams more of glucose if the repeat blood glucose level is still 70 mg/dL or below.  Eat a meal or snack within 1 hour once blood glucose levels return to normal. Be alert to feeling very thirsty and urinating more frequently than usual, which are early signs of hyperglycemia. An early awareness of hyperglycemia allows for prompt treatment. Treat hyperglycemia as directed by your health care provider. Engage in at least 150 minutes of moderate-intensity physical activity a week, spread over at least 3 days of the week or as directed by your health care provider. In addition, you should engage in resistance exercise at least 2 times a week or as directed by your health care provider. Try to spend no more than 90 minutes at one time inactive. Adjust your medicine and food intake as needed if you start a new exercise or sport. Follow your sick-day plan anytime you are unable to eat or drink as  usual. Do not use any tobacco products including cigarettes, chewing tobacco, or electronic cigarettes. If you need help quitting, ask your health care provider. Limit alcohol intake to no more than 1 drink per day for nonpregnant women and 2 drinks per day for men. You should drink alcohol only when you are also eating food. Talk with your health care provider whether alcohol is safe for you. Tell your health care provider if you drink alcohol several times a week. Keep all follow-up visits as directed by your health care provider. This is important. Schedule an eye exam soon after the diagnosis of type 2 diabetes and then annually. Perform daily skin and foot care. Examine your skin and feet daily for cuts, bruises, redness, nail problems, bleeding, blisters, or sores. A foot exam by a health care provider should be done annually. Brush your teeth and gums at least twice a day and floss at least once a day. Follow up with your dentist regularly. Share your diabetes management plan with your workplace or school. Keep your immunizations up to date. It is recommended that you receive a flu (influenza) vaccine every year. It is also recommended that you receive a pneumonia (pneumococcal) vaccine. If you are 43 years of age or older and have never received a pneumonia vaccine, this vaccine may be given as a series of two separate shots. Ask your health care provider which additional vaccines may be recommended. Learn to manage stress. Obtain ongoing diabetes education and support as needed. Participate in or seek rehabilitation as needed to maintain or improve independence and quality of life. Request a physical or occupational therapy referral if you are having foot or hand numbness, or difficulties with grooming, dressing, eating, or physical activity. SEEK MEDICAL CARE IF:  You are unable to eat food or drink fluids for more than 6 hours. You have nausea and vomiting for more than 6 hours. Your blood  glucose level is over 240 mg/dL. There is a change in mental status. You develop an additional serious illness. You have diarrhea for more than 6 hours. You have been sick or have had a fever for a couple of days and are not getting better. You have pain during any physical activity.  SEEK IMMEDIATE MEDICAL CARE IF: You have difficulty breathing. You have moderate to large ketone levels.   This information is not intended to replace advice given to you by your health care provider. Make sure you discuss any questions you have with your health care provider.   Document Released: 03/21/2005 Document Revised: 12/10/2014 Document Reviewed: 10/18/2011 Elsevier Interactive Patient Education [employer].

## 2015-06-17 NOTE — Progress Notes (Signed)
Subjective:    Patient ID: Thomas Summers, male    DOB: 1955/08/13, 60 y.o.   MRN: [id number]  HPI The patient comes in today for a wellness visit.  Has colonoscopy scheduled May 11.   A review of their health history was completed.  A review of medications was also completed.  Any needed refills; yes needs update on refills  Eating habits: NOt health conscious  Falls/  MVA accidents in past few months: None  Regular exercise: treadmill/light weights three times a week  Specialist pt sees on regular basis: none  Preventative health issues were discussed.   Additional concerns: none  Results for orders placed or performed in visit on 06/01/15  Lipid panel  Result Value Ref Range   Cholesterol, Total 220 (H) 100 - 199 mg/dL   Triglycerides 112 0 - 149 mg/dL   HDL 52 >39 mg/dL   VLDL Cholesterol Cal 22 5 - 40 mg/dL   LDL Calculated 146 (H) 0 - 99 mg/dL   Chol/HDL Ratio 4.2 0.0 - 5.0 ratio units  Hepatic function panel  Result Value Ref Range   Total Protein 6.8 6.0 - 8.5 g/dL   Albumin 4.3 3.5 - 5.5 g/dL   Bilirubin Total 0.8 0.0 - 1.2 mg/dL   Bilirubin, Direct 0.25 0.00 - 0.40 mg/dL   Alkaline Phosphatase 99 39 - 117 IU/L   AST 74 (H) 0 - 40 IU/L   ALT 194 (H) 0 - 44 IU/L  Basic metabolic panel  Result Value Ref Range   Glucose 120 (H) 65 - 99 mg/dL   BUN 12 6 - 24 mg/dL   Creatinine, Ser 0.77 0.76 - 1.27 mg/dL   GFR calc non Af Amer 99 >59 mL/min/1.73   GFR calc Af Amer 115 >59 mL/min/1.73   BUN/Creatinine Ratio 16 9 - 20   Sodium 138 134 - 144 mmol/L   Potassium 4.0 3.5 - 5.2 mmol/L   Chloride 99 96 - 106 mmol/L   CO2 24 18 - 29 mmol/L   Calcium 8.9 8.7 - 10.2 mg/dL  Hemoglobin A1c  Result Value Ref Range   Hgb A1c MFr Bld 7.4 (H) 4.8 - 5.6 %   Est. average glucose Bld gHb Est-mCnc 166 mg/dL    Pt taking faithfully on the bp , just statred with a monitor  Review of Systems  Constitutional: Negative for fever, activity change and appetite change.   HENT: Negative for congestion and rhinorrhea.   Eyes: Negative for discharge.  Respiratory: Negative for cough and wheezing.   Cardiovascular: Negative for chest pain.  Gastrointestinal: Negative for vomiting, abdominal pain and blood in stool.  Genitourinary: Negative for frequency and difficulty urinating.  Musculoskeletal: Negative for neck pain.  Skin: Negative for rash.  Allergic/Immunologic: Negative for environmental allergies and food allergies.  Neurological: Negative for weakness and headaches.  Psychiatric/Behavioral: Negative for agitation.  All other systems reviewed and are negative.      Objective:   Physical Exam  Constitutional: He appears well-developed and well-nourished.  HENT:  Head: Normocephalic and atraumatic.  Right Ear: External ear normal.  Left Ear: External ear normal.  Nose: Nose normal.  Mouth/Throat: Oropharynx is clear and moist.  Eyes: EOM are normal. Pupils are equal, round, and reactive to light.  Neck: Normal range of motion. Neck supple. No thyromegaly present.  Cardiovascular: Normal rate, regular rhythm and normal heart sounds.   No murmur heard. Pulmonary/Chest: Effort normal and breath sounds normal. No respiratory distress. He has  no wheezes.  Abdominal: Soft. Bowel sounds are normal. He exhibits no distension and no mass. There is no tenderness.  Genitourinary: Penis normal.  Musculoskeletal: Normal range of motion. He exhibits no edema.  Lymphadenopathy:    He has no cervical adenopathy.  Neurological: He is alert. He exhibits normal muscle tone.  Skin: Skin is warm and dry. No erythema.  Psychiatric: He has a normal mood and affect. His behavior is normal. Judgment normal.  Vitals reviewed.         Assessment & Plan:  Impression 1 well adult exam. Patient is overweight. Exercise discussed. Diet discussed. Lipids rub need to cut back on fats. Educational information given. #2 new onset diabetes patient adamant about not  addressing problems today for his well visit Plan return in 2 weeks for diabetes visit. Appropriate further blood work. Colonoscopy scheduled soon. WSL

## 2015-06-19 LAB — MICROALBUMIN / CREATININE URINE RATIO
Creatinine, Urine: 45.7 mg/dL
MICROALB/CREAT RATIO: 10.1 mg/g{creat} (ref 0.0–30.0)
Microalbumin, Urine: 4.6 ug/mL

## 2015-06-19 LAB — FERRITIN: FERRITIN: 578 ng/mL — AB (ref 30–400)

## 2015-06-19 LAB — PSA: PROSTATE SPECIFIC AG, SERUM: 1.5 ng/mL (ref 0.0–4.0)

## 2015-06-27 ENCOUNTER — Encounter (HOSPITAL_COMMUNITY): Payer: Self-pay | Admitting: *Deleted

## 2015-06-27 ENCOUNTER — Emergency Department (HOSPITAL_COMMUNITY)
Admission: EM | Admit: 2015-06-27 | Discharge: 2015-06-27 | Disposition: A | Discharge status: Home / Self Care | Payer: 59 | Attending: Emergency Medicine | Admitting: Emergency Medicine

## 2015-06-27 ENCOUNTER — Emergency Department (HOSPITAL_COMMUNITY): Payer: 59

## 2015-06-27 DIAGNOSIS — N410 Acute prostatitis: Secondary | ICD-10-CM | POA: Diagnosis not present

## 2015-06-27 DIAGNOSIS — E785 Hyperlipidemia, unspecified: Secondary | ICD-10-CM | POA: Diagnosis not present

## 2015-06-27 DIAGNOSIS — Z79899 Other long term (current) drug therapy: Secondary | ICD-10-CM | POA: Insufficient documentation

## 2015-06-27 DIAGNOSIS — E119 Type 2 diabetes mellitus without complications: Secondary | ICD-10-CM | POA: Insufficient documentation

## 2015-06-27 DIAGNOSIS — R000 Tachycardia, unspecified: Secondary | ICD-10-CM | POA: Diagnosis not present

## 2015-06-27 DIAGNOSIS — F329 Major depressive disorder, single episode, unspecified: Secondary | ICD-10-CM | POA: Insufficient documentation

## 2015-06-27 DIAGNOSIS — I10 Essential (primary) hypertension: Secondary | ICD-10-CM | POA: Diagnosis present

## 2015-06-27 HISTORY — DX: Type 2 diabetes mellitus without complications: E11.9

## 2015-06-27 LAB — URINALYSIS, ROUTINE W REFLEX MICROSCOPIC
BILIRUBIN URINE: NEGATIVE
Glucose, UA: NEGATIVE mg/dL
KETONES UR: 15 mg/dL — AB
NITRITE: NEGATIVE
Protein, ur: NEGATIVE mg/dL
Specific Gravity, Urine: 1.01 (ref 1.005–1.030)
pH: 6 (ref 5.0–8.0)

## 2015-06-27 LAB — CBC WITH DIFFERENTIAL/PLATELET
BASOS PCT: 0 %
Basophils Absolute: 0 10*3/uL (ref 0.0–0.1)
EOS ABS: 0.1 10*3/uL (ref 0.0–0.7)
EOS PCT: 1 %
HCT: 42.9 % (ref 39.0–52.0)
HEMOGLOBIN: 15.1 g/dL (ref 13.0–17.0)
LYMPHS ABS: 1.4 10*3/uL (ref 0.7–4.0)
Lymphocytes Relative: 8 %
MCH: 31.8 pg (ref 26.0–34.0)
MCHC: 35.2 g/dL (ref 30.0–36.0)
MCV: 90.3 fL (ref 78.0–100.0)
MONOS PCT: 6 %
Monocytes Absolute: 1.2 10*3/uL — ABNORMAL HIGH (ref 0.1–1.0)
NEUTROS PCT: 85 %
Neutro Abs: 15.8 10*3/uL — ABNORMAL HIGH (ref 1.7–7.7)
PLATELETS: 200 10*3/uL (ref 150–400)
RBC: 4.75 MIL/uL (ref 4.22–5.81)
RDW: 14.2 % (ref 11.5–15.5)
WBC: 18.5 10*3/uL — AB (ref 4.0–10.5)

## 2015-06-27 LAB — URINE MICROSCOPIC-ADD ON: SQUAMOUS EPITHELIAL / LPF: NONE SEEN

## 2015-06-27 LAB — BASIC METABOLIC PANEL
Anion gap: 11 (ref 5–15)
BUN: 18 mg/dL (ref 6–20)
CALCIUM: 8.9 mg/dL (ref 8.9–10.3)
CO2: 20 mmol/L — ABNORMAL LOW (ref 22–32)
CREATININE: 0.97 mg/dL (ref 0.61–1.24)
Chloride: 104 mmol/L (ref 101–111)
GFR calc non Af Amer: >60 mL/min (ref >60)
Glucose, Bld: 99 mg/dL (ref 65–99)
Potassium: 3.5 mmol/L (ref 3.5–5.1)
SODIUM: 135 mmol/L (ref 135–145)

## 2015-06-27 LAB — CBG MONITORING, ED: GLUCOSE-CAPILLARY: 97 mg/dL (ref 65–99)

## 2015-06-27 LAB — LACTIC ACID, PLASMA: LACTIC ACID, VENOUS: 0.9 mmol/L (ref 0.5–2.0)

## 2015-06-27 MED ORDER — SODIUM CHLORIDE 0.9 % IV SOLN
Freq: Once | INTRAVENOUS | Status: AC
  Administered 2015-06-27: 23:00:00 via INTRAVENOUS

## 2015-06-27 MED ORDER — SODIUM CHLORIDE 0.9 % IV SOLN
Freq: Once | INTRAVENOUS | Status: AC
  Administered 2015-06-27: 20:00:00 via INTRAVENOUS

## 2015-06-27 MED ORDER — DEXTROSE 5 % IV SOLN
1.0000 g | Freq: Once | INTRAVENOUS | Status: AC
  Administered 2015-06-27: 1 g via INTRAVENOUS
  Filled 2015-06-27: qty 10

## 2015-06-27 MED ORDER — LEVOFLOXACIN 500 MG PO TABS: 500.0000 mg | ORAL_TABLET | Freq: Every day | ORAL | Status: DC

## 2015-06-27 MED ORDER — IBUPROFEN 400 MG PO TABS
ORAL_TABLET | ORAL | Status: AC
  Administered 2015-06-27: 400 mg via ORAL
  Filled 2015-06-27: qty 1

## 2015-06-27 MED ORDER — ACETAMINOPHEN 500 MG PO TABS
1000.0000 mg | ORAL_TABLET | Freq: Once | ORAL | Status: AC
  Administered 2015-06-27: 1000 mg via ORAL
  Filled 2015-06-27: qty 2

## 2015-06-27 NOTE — ED Notes (Addendum)
Pt reports having a fever, cough, nausea and chills today. Pt states he just hasn't been feeling well today so he checked his BP and it was high and his pulse rate was over 140. Nurse line told him to come to the hospital. Pt denies chest pain. Pt states his highest fever 101.4 and pt took ibuprofen 400 mg around 6:30 pm.

## 2015-06-27 NOTE — Discharge Instructions (Signed)
Stay hydrated, push fluids. Follow-up with Dr. Wolfgang Phoenix on Monday to check urine culture.  Prostatitis The prostate gland is about the size and shape of a walnut. It is located just below your bladder. It produces one of the components of semen, which is made up of sperm and the fluids that help nourish and transport it out from the testicles. Prostatitis is inflammation of the prostate gland.  There are four types of prostatitis:  Acute bacterial prostatitis. This is the least common type of prostatitis. It starts quickly and usually is associated with a bladder infection, high fever, and shaking chills. It can occur at any age.  Chronic bacterial prostatitis. This is a persistent bacterial infection in the prostate. It usually develops from repeated acute bacterial prostatitis or acute bacterial prostatitis that was not properly treated. It can occur in men of any age but is most common in middle-aged men whose prostate has begun to enlarge. The symptoms are not as severe as those in acute bacterial prostatitis. Discomfort in the part of your body that is in front of your rectum and below your scrotum (perineum), lower abdomen, or in the head of your penis (glans) may represent your primary discomfort.  Chronic prostatitis (nonbacterial). This is the most common type of prostatitis. It is inflammation of the prostate gland that is not caused by a bacterial infection. The cause is unknown and may be associated with a viral infection or autoimmune disorder.  Prostatodynia (pelvic floor disorder). This is associated with increased muscular tone in the pelvis surrounding the prostate. CAUSES The causes of bacterial prostatitis are bacterial infection. The causes of the other types of prostatitis are unknown.  SYMPTOMS  Symptoms can vary depending upon the type of prostatitis that exists. There can also be overlap in symptoms. Possible symptoms for each type of prostatitis are listed below. Acute  Bacterial Prostatitis  Painful urination.  Fever or chills.  Muscle or joint pains.  Low back pain.  Low abdominal pain.  Inability to empty bladder completely. Chronic Bacterial Prostatitis, Chronic Nonbacterial Prostatitis, and Prostatodynia  Sudden urge to urinate.  Frequent urination.  Difficulty starting urine stream.  Weak urine stream.  Discharge from the urethra.  Dribbling after urination.  Rectal pain.  Pain in the testicles, penis, or tip of the penis.  Pain in the perineum.  Problems with sexual function.  Painful ejaculation.  Bloody semen. DIAGNOSIS  In order to diagnose prostatitis, your health care provider will ask about your symptoms. One or more urine samples will be taken and tested (urinalysis). If the urinalysis result is negative for bacteria, your health care provider may use a finger to feel your prostate (digital rectal exam). This exam helps your health care provider determine if your prostate is swollen and tender. It will also produce a specimen of semen that can be analyzed. TREATMENT  Treatment for prostatitis depends on the cause. If a bacterial infection is the cause, it can be treated with antibiotic medicine. In cases of chronic bacterial prostatitis, the use of antibiotics for up to 1 month or 6 weeks may be necessary. Your health care provider may instruct you to take sitz baths to help relieve pain. A sitz bath is a bath of hot water in which your hips and buttocks are under water. This relaxes the pelvic floor muscles and often helps to relieve the pressure on your prostate. HOME CARE INSTRUCTIONS   Take all medicines as directed by your health care provider.  Take sitz baths  as directed by your health care provider. SEEK MEDICAL CARE IF:   Your symptoms get worse, not better.  You have a fever. SEEK IMMEDIATE MEDICAL CARE IF:   You have chills.  You feel nauseous or vomit.  You feel lightheaded or faint.  You are  unable to urinate.  You have blood or blood clots in your urine. MAKE SURE YOU:  Understand these instructions.  Will watch your condition.  Will get help right away if you are not doing well or get worse.   This information is not intended to replace advice given to you by your health care provider. Make sure you discuss any questions you have with your health care provider.   Document Released: 03/18/2000 Document Revised: 04/11/2014 Document Reviewed: 10/08/2012 Elsevier Interactive Patient Education [employer].

## 2015-06-27 NOTE — ED Provider Notes (Signed)
CSN: [id number]     Arrival date & time 06/27/15  1938 History   First Summers Initiated Contact with Patient 06/27/15 1959     Chief Complaint  Patient presents with  . Hypertension     HPI  Patient presents for evaluation of fever shakes chills and burning with urination. Symptoms starting at home earlier today. Has his normal state of health until today. He had replaced his mailbox in his front yard started feeling shakes and chills and somewhat weak. Temperature at home 101.42 Ibuprofen and presents here.  Occasional cough. No nausea vomiting or diarrhea. Has had previous prostatitis in the past. Has not had urinary hesitancy.  Past Medical History  Diagnosis Date  . Hyperlipidemia   . Hypertension   . Depression   . Allergy   . Diabetes mellitus without complication [facility])    Past Surgical History  Procedure Laterality Date  . Tonsillectomy    . Other surgical history      left ear surgery  . Other surgical history      fracture arm  . Meniscus repair Left 1997   Family History  Problem Relation Age of Onset  . Hypertension Father   . Heart disease Father    Social History  Substance Use Topics  . Smoking status: Never Smoker   . Smokeless tobacco: None  . Alcohol Use: No    Review of Systems  Constitutional: Positive for fever, chills, diaphoresis and fatigue. Negative for appetite change.  HENT: Negative for mouth sores, sore throat and trouble swallowing.   Eyes: Negative for visual disturbance.  Respiratory: Negative for cough, chest tightness, shortness of breath and wheezing.   Cardiovascular: Negative for chest pain.  Gastrointestinal: Negative for nausea, vomiting, abdominal pain, diarrhea and abdominal distention.  Endocrine: Negative for polydipsia, polyphagia and polyuria.  Genitourinary: Positive for dysuria. Negative for frequency and hematuria.  Musculoskeletal: Negative for gait problem.  Skin: Negative for color change, pallor and rash.   Neurological: Negative for dizziness, syncope, light-headedness and headaches.  Hematological: Does not bruise/bleed easily.  Psychiatric/Behavioral: Negative for behavioral problems and confusion.      Allergies  Review of patient's allergies indicates no known allergies.  Home Medications   Prior to Admission medications   Medication Sig Start Date End Date Taking? Authorizing Provider  lisinopril (PRINIVIL,ZESTRIL) 5 MG tablet Take 1 tablet (5 mg total) by mouth daily. 06/17/15  Yes Thomas Kirschner, Summers  levofloxacin (LEVAQUIN) 500 MG tablet Take 1 tablet (500 mg total) by mouth daily. 06/27/15   Thomas Furry, Summers   BP 118/67 mmHg  Pulse 110  Temp(Src) 98.3 F (36.8 C) (Oral)  Resp 22  Ht 6\' 1"  (1.854 m)  Wt 230 lb (104.327 kg)  BMI 30.35 kg/m2  SpO2 96% Physical Exam  Constitutional: He is oriented to person, place, and time. He appears well-developed and well-nourished. No distress.  HENT:  Head: Normocephalic.  Eyes: Conjunctivae are normal. Pupils are equal, round, and reactive to light. No scleral icterus.  Neck: Normal range of motion. Neck supple. No thyromegaly present.  Cardiovascular: Regular rhythm.  Tachycardia present.  Exam reveals no gallop and no friction rub.   No murmur heard. Cardiac. Sinus tach on the monitor.  Pulmonary/Chest: Effort normal and breath sounds normal. No respiratory distress. He has no wheezes. He has no rales.  Abdominal: Soft. Bowel sounds are normal. He exhibits no distension. There is no tenderness. There is no rebound.  Musculoskeletal: Normal range of motion.  Neurological:  He is alert and oriented to person, place, and time.  Skin: Skin is warm and dry. No rash noted.  Psychiatric: He has a normal mood and affect. His behavior is normal.    ED Course  Procedures (including critical care time) Labs Review Labs Reviewed  CBC WITH DIFFERENTIAL/PLATELET - Abnormal; Notable for the following:    WBC 18.5 (*)    Neutro Abs 15.8 (*)     Monocytes Absolute 1.2 (*)    All other components within normal limits  BASIC METABOLIC PANEL - Abnormal; Notable for the following:    CO2 20 (*)    All other components within normal limits  URINALYSIS, ROUTINE W REFLEX MICROSCOPIC (NOT AT [facility]) - Abnormal; Notable for the following:    Hgb urine dipstick SMALL (*)    Ketones, ur 15 (*)    Leukocytes, UA SMALL (*)    All other components within normal limits  URINE MICROSCOPIC-ADD ON - Abnormal; Notable for the following:    Bacteria, UA RARE (*)    All other components within normal limits  CULTURE, BLOOD (ROUTINE X 2)  CULTURE, BLOOD (ROUTINE X 2)  LACTIC ACID, PLASMA  LACTIC ACID, PLASMA  CBG MONITORING, ED    Imaging Review Dg Chest 2 View  06/27/2015  CLINICAL DATA:  Fever, cough EXAM: CHEST  2 VIEW COMPARISON:  None. FINDINGS: Lungs are clear.  No pleural effusion or pneumothorax. The heart is normal in size. Visualized osseous structures are within normal limits. IMPRESSION: No evidence of acute cardiopulmonary disease. Electronically Signed   By: Thomas Summers M.D.   On: 06/27/2015 21:24   I have personally reviewed and evaluated these images and lab results as part of my medical decision-making.   EKG Interpretation   Date/Time:  Saturday June 27 2015 19:58:10 EDT Ventricular Rate:  134 PR Interval:  137 QRS Duration: 93 QT Interval:  299 QTC Calculation: 446 R Axis:   28 Text Interpretation:  Sinus tachycardia Ventricular premature complex  Abnormal R-wave progression, early transition Confirmed by Thomas Rinks  Summers, Thomas Summers  [phone]) on 06/27/2015 9:26:53 PM      MDM   Final diagnoses:  Acute prostatitis   Normal lactate. Getting IV fluids and IV Levaquin. Appropriate for discharge home. Plan will be Levaquin. I have asked him to recheck with Thomas Summers on Monday to recheck culture results. ER with any worsening or inability to tolerate symptoms, or medications.    Thomas Furry, Summers 06/27/15 [phone]

## 2015-06-27 NOTE — ED Notes (Signed)
EKG done @ 1958, given to Dr Jeneen Rinks

## 2015-06-29 ENCOUNTER — Telehealth: Payer: Self-pay | Admitting: Family Medicine

## 2015-06-29 NOTE — Telephone Encounter (Signed)
No cx done on urine, bl cxs nef sofar, rec f u visit and rep uzlater this wk, cst

## 2015-06-29 NOTE — Telephone Encounter (Signed)
Patient was seen in ER on 3/25 for prostate infection and they did a cult and told patient to call here for results and to make sure they gave the right antibiotic to him.

## 2015-06-29 NOTE — Telephone Encounter (Signed)
Discussed with patient. Patient states he has a follow up office visit scheduled Thurs with Dr Richardson Landry for a recheck.

## 2015-06-29 NOTE — Telephone Encounter (Signed)
[facility]

## 2015-06-29 NOTE — Telephone Encounter (Signed)
No record of urine culture ordered by ER- only blood cultures x 2

## 2015-07-02 ENCOUNTER — Ambulatory Visit (INDEPENDENT_AMBULATORY_CARE_PROVIDER_SITE_OTHER): Payer: 59 | Admitting: Family Medicine

## 2015-07-02 ENCOUNTER — Encounter: Payer: Self-pay | Admitting: Family Medicine

## 2015-07-02 VITALS — BP 138/88 | Ht 73.5 in | Wt 223.0 lb

## 2015-07-02 DIAGNOSIS — E119 Type 2 diabetes mellitus without complications: Secondary | ICD-10-CM

## 2015-07-02 LAB — CULTURE, BLOOD (ROUTINE X 2)
CULTURE: NO GROWTH
Culture: NO GROWTH

## 2015-07-02 MED ORDER — BLOOD GLUCOSE MONITORING SUPPL W/DEVICE KIT: PACK | Status: DC

## 2015-07-02 NOTE — Patient Instructions (Addendum)
Yrly eye exam necessary, let them know u have diabetes.  Regular exercise is crucial, at least four times per wk  Check two fasting sugars each week  Long term goal from ADA most of your morn sugars to fall between 70 and 130, more perfect goal is for morn sugars (when not on med) to mostly fall below 120  You do not need a foot doctor--yet, we'll ck your feet regularly  Chronic disease management i. E. Lipids, blood pressure, etc. We work under a differnet level of need for control i. E. Tighter, stay tuned

## 2015-07-02 NOTE — Progress Notes (Signed)
   Subjective:    Patient ID: Thomas Summers, male    DOB: 1955-08-22, 60 y.o.   MRN: [id number]  Diabetes He presents for his initial diabetic visit. He has type 2 diabetes mellitus.  A1C done on bloodwork 4 weeks ago. 7.4. Needs rx for diabetic meter and supplies.   sees Dietician next Thursday. Has cut out sweets and sodas in diet.    Pt has been walking on treadmill.   Bumped left arm last week and spot not healing.  Went to ED on Saturday for prostate infection. Taking antibiotic. Feeling much better. Emergency room report reviewed.    compliant with blood pressure medicine obvious side effects. Watching salt intake.   patient  Now watching diet. His cut sugars down.  Diet working on it quite a bit  Exercising every day , which is definitely an improvement  Since st not doing muc       Review of Systems No headache, no major weight loss or weight gain, no chest pain no back pain abdominal pain no change in bowel habits complete ROS otherwise negative     Objective:   Physical Exam  alert vital stable HEENT normal. Lungs clear. Heart regular in rhythm. Ankles without edema.       Assessment & Plan:   impression 1 type 2 diabetes initial visit. Long discussion held. Please see patient instructions. Many aspects of long-term implications current management diet exercise complications etc. Discussed at length #2 hypertension good control #3 resolving prostatitis plan monitor prescribed. Patient at checkup sugars week. To attend the educational session. Follow-up in several months A1c then 25 minutes spent most in discussion for new onset diabetes

## 2015-07-03 ENCOUNTER — Encounter (INDEPENDENT_AMBULATORY_CARE_PROVIDER_SITE_OTHER): Payer: Self-pay | Admitting: *Deleted

## 2015-07-03 ENCOUNTER — Other Ambulatory Visit (INDEPENDENT_AMBULATORY_CARE_PROVIDER_SITE_OTHER): Payer: Self-pay | Admitting: *Deleted

## 2015-07-03 NOTE — Telephone Encounter (Signed)
moviprep

## 2015-07-06 MED ORDER — PEG-KCL-NACL-NASULF-NA ASC-C 100 G PO SOLR: 1.0000 | Freq: Once | ORAL | Status: DC

## 2015-07-09 ENCOUNTER — Ambulatory Visit: Payer: 59 | Admitting: Nutrition

## 2015-07-15 ENCOUNTER — Telehealth (INDEPENDENT_AMBULATORY_CARE_PROVIDER_SITE_OTHER): Payer: Self-pay | Admitting: *Deleted

## 2015-07-15 NOTE — Telephone Encounter (Signed)
agree

## 2015-07-15 NOTE — Telephone Encounter (Signed)
.  Referring MD/PCP: steve luking   Procedure: tcs  Reason/Indication:  screening  Has patient had this procedure before?  no  If so, when, by whom and where?    Is there a family history of colon cancer?  no  Who?  What age when diagnosed?    Is patient diabetic?   Yes, diet control      Does patient have prosthetic heart valve or mechanical valve?  no  Do you have a pacemaker?  no  Has patient ever had endocarditis? no  Has patient had joint replacement within last 12 months?  no  Does patient tend to be constipated or take laxatives? no  Does patient have a history of alcohol/drug use?  no  Is patient on Coumadin, Plavix and/or Aspirin? no  Medications: lisinopril 5 mg daily  Allergies: nkda  Medication Adjustment:   Procedure date & time: 08/13/15 at 1200

## 2015-07-27 ENCOUNTER — Encounter: Payer: 59 | Admitting: Family Medicine

## 2015-08-13 ENCOUNTER — Ambulatory Visit (HOSPITAL_COMMUNITY)
Admission: RE | Admit: 2015-08-13 | Discharge: 2015-08-13 | Disposition: A | Discharge status: Home / Self Care | Payer: 59 | Source: Ambulatory Visit | Attending: Internal Medicine | Admitting: Internal Medicine

## 2015-08-13 ENCOUNTER — Encounter (HOSPITAL_COMMUNITY): Payer: Self-pay

## 2015-08-13 ENCOUNTER — Encounter (HOSPITAL_COMMUNITY)
Admission: RE | Disposition: A | Discharge status: Home / Self Care | Payer: Self-pay | Source: Ambulatory Visit | Attending: Internal Medicine

## 2015-08-13 DIAGNOSIS — D125 Benign neoplasm of sigmoid colon: Secondary | ICD-10-CM | POA: Insufficient documentation

## 2015-08-13 DIAGNOSIS — E785 Hyperlipidemia, unspecified: Secondary | ICD-10-CM | POA: Diagnosis not present

## 2015-08-13 DIAGNOSIS — Z79899 Other long term (current) drug therapy: Secondary | ICD-10-CM | POA: Insufficient documentation

## 2015-08-13 DIAGNOSIS — K644 Residual hemorrhoidal skin tags: Secondary | ICD-10-CM | POA: Diagnosis not present

## 2015-08-13 DIAGNOSIS — D120 Benign neoplasm of cecum: Secondary | ICD-10-CM | POA: Diagnosis not present

## 2015-08-13 DIAGNOSIS — E119 Type 2 diabetes mellitus without complications: Secondary | ICD-10-CM | POA: Insufficient documentation

## 2015-08-13 DIAGNOSIS — D123 Benign neoplasm of transverse colon: Secondary | ICD-10-CM | POA: Insufficient documentation

## 2015-08-13 DIAGNOSIS — I10 Essential (primary) hypertension: Secondary | ICD-10-CM | POA: Diagnosis not present

## 2015-08-13 DIAGNOSIS — Z1211 Encounter for screening for malignant neoplasm of colon: Secondary | ICD-10-CM | POA: Diagnosis present

## 2015-08-13 DIAGNOSIS — F329 Major depressive disorder, single episode, unspecified: Secondary | ICD-10-CM | POA: Diagnosis not present

## 2015-08-13 HISTORY — PX: COLONOSCOPY: SHX174

## 2015-08-13 HISTORY — PX: COLONOSCOPY: SHX5424

## 2015-08-13 SURGERY — COLONOSCOPY
Anesthesia: Moderate Sedation

## 2015-08-13 MED ORDER — MIDAZOLAM HCL 5 MG/5ML IJ SOLN
INTRAMUSCULAR | Status: AC
  Filled 2015-08-13: qty 10

## 2015-08-13 MED ORDER — MIDAZOLAM HCL 5 MG/5ML IJ SOLN
INTRAMUSCULAR | Status: DC | PRN
  Administered 2015-08-13: 3 mg via INTRAVENOUS
  Administered 2015-08-13 (×3): 2 mg via INTRAVENOUS

## 2015-08-13 MED ORDER — MEPERIDINE HCL 50 MG/ML IJ SOLN
INTRAMUSCULAR | Status: DC | PRN
Start: 2015-08-13 — End: 2015-08-13
  Administered 2015-08-13 (×2): 25 mg via INTRAVENOUS

## 2015-08-13 MED ORDER — STERILE WATER FOR IRRIGATION IR SOLN
Status: DC | PRN
  Administered 2015-08-13: 12:00:00

## 2015-08-13 MED ORDER — MEPERIDINE HCL 50 MG/ML IJ SOLN
INTRAMUSCULAR | Status: AC
  Filled 2015-08-13: qty 1

## 2015-08-13 MED ORDER — SODIUM CHLORIDE 0.9 % IV SOLN
INTRAVENOUS | Status: DC
  Administered 2015-08-13: 11:00:00 via INTRAVENOUS

## 2015-08-13 NOTE — H&P (Signed)
Thomas Summers is an 60 y.o. male.   Chief Complaint: Patient is here for colonoscopy. HPI: Patient is 60 year old Caucasian male who is in for screening colonoscopy. He denies abdominal pain change in bowel habits or rectal bleeding. This is patient's first exam. Family history is negative for CRC.  Past Medical History  Diagnosis Date  . Hyperlipidemia   . Hypertension   . Depression   . Allergy   . Diabetes mellitus without complication [facility])     Past Surgical History  Procedure Laterality Date  . Tonsillectomy    . Other surgical history      left ear surgery  . Other surgical history      fracture arm  . Meniscus repair Left 1997    Family History  Problem Relation Age of Onset  . Hypertension Father   . Heart disease Father    Social History:  reports that he has never smoked. He does not have any smokeless tobacco history on file. He reports that he does not drink alcohol or use illicit drugs.  Allergies: No Known Allergies  Medications Prior to Admission  Medication Sig Dispense Refill  . lisinopril (PRINIVIL,ZESTRIL) 5 MG tablet Take 1 tablet (5 mg total) by mouth daily. 30 tablet 5  . peg 3350 powder (MOVIPREP) 100 g SOLR Take 1 kit (200 g total) by mouth once. 1 kit 0    No results found for this or any previous visit (from the past 48 hour(s)). No results found.  ROS  Blood pressure 145/87, pulse 69, temperature 97.4 F (36.3 C), temperature source Oral, resp. rate 15, height _0  (1.854 m), weight 212 lb (96.163 kg), SpO2 99 %. Physical Exam  Constitutional: He appears well-developed and well-nourished.  HENT:  Mouth/Throat: Oropharynx is clear and moist.  Eyes: Conjunctivae are normal. No scleral icterus.  Neck: No thyromegaly present.  Cardiovascular: Normal rate, regular rhythm and normal heart sounds.   No murmur heard. Respiratory: Effort normal and breath sounds normal.  GI: Soft. He exhibits no distension and no mass. There is no  tenderness.  Musculoskeletal: He exhibits no edema.  Lymphadenopathy:    He has no cervical adenopathy.  Neurological: He is alert.  Skin: Skin is warm and dry.     Assessment/Plan Average risk screening colonoscopy.  Rogene Houston, MD 08/13/2015, 12:16 PM

## 2015-08-13 NOTE — Discharge Instructions (Signed)
Resume usual medications and diet. No driving for 24 hours. Physician will call with biopsy results per   Colonoscopy, Care After These instructions give you information on caring for yourself after your procedure. Your doctor may also give you more specific instructions. Call your doctor if you have any problems or questions after your procedure. HOME CARE  Do not drive for 24 hours.  Do not sign important papers or use machinery for 24 hours.  You may shower.  You may go back to your usual activities, but go slower for the first 24 hours.  Take rest breaks often during the first 24 hours.  Walk around or use warm packs on your belly (abdomen) if you have belly cramping or gas.  Drink enough fluids to keep your pee (urine) clear or pale yellow.  Resume your normal diet. Avoid heavy or fried foods.  Avoid drinking alcohol for 24 hours or as told by your doctor.  Only take medicines as told by your doctor. If a tissue sample (biopsy) was taken during the procedure:   Do not take aspirin or blood thinners for 7 days, or as told by your doctor.  Do not drink alcohol for 7 days, or as told by your doctor.  Eat soft foods for the first 24 hours. GET HELP IF: You still have a small amount of blood in your poop (stool) 2-3 days after the procedure. GET HELP RIGHT AWAY IF:  You have more than a small amount of blood in your poop.  You see clumps of tissue (blood clots) in your poop.  Your belly is puffy (swollen).  You feel sick to your stomach (nauseous) or throw up (vomit).  You have a fever.  You have belly pain that gets worse and medicine does not help. MAKE SURE YOU:  Understand these instructions.  Will watch your condition.  Will get help right away if you are not doing well or get worse.   This information is not intended to replace advice given to you by your health care provider. Make sure you discuss any questions you have with your health care  provider.   Document Released: 04/23/2010 Document Revised: 03/26/2013 Document Reviewed: 11/26/2012 Elsevier Interactive Patient Education 2016 Elsevier Inc.   Colon Polyps Polyps are lumps of extra tissue growing inside the body. Polyps can grow in the large intestine (colon). Most colon polyps are noncancerous (benign). However, some colon polyps can become cancerous over time. Polyps that are larger than a pea may be harmful. To be safe, caregivers remove and test all polyps. CAUSES  Polyps form when mutations in the genes cause your cells to grow and divide even though no more tissue is needed. RISK FACTORS There are a number of risk factors that can increase your chances of getting colon polyps. They include:  Being older than 50 years.  Family history of colon polyps or colon cancer.  Long-term colon diseases, such as colitis or Crohn disease.  Being overweight.  Smoking.  Being inactive.  Drinking too much alcohol. SYMPTOMS  Most small polyps do not cause symptoms. If symptoms are present, they may include:  Blood in the stool. The stool may look dark red or black.  Constipation or diarrhea that lasts longer than 1 week. DIAGNOSIS People often do not know they have polyps until their caregiver finds them during a regular checkup. Your caregiver can use 4 tests to check for polyps:  Digital rectal exam. The caregiver wears gloves and feels inside the  rectum. This test would find polyps only in the rectum.  Barium enema. The caregiver puts a liquid called barium into your rectum before taking X-rays of your colon. Barium makes your colon look white. Polyps are dark, so they are easy to see in the X-ray pictures.  Sigmoidoscopy. A thin, flexible tube (sigmoidoscope) is placed into your rectum. The sigmoidoscope has a light and tiny camera in it. The caregiver uses the sigmoidoscope to look at the last third of your colon.  Colonoscopy. This test is like sigmoidoscopy,  but the caregiver looks at the entire colon. This is the most common method for finding and removing polyps. TREATMENT  Any polyps will be removed during a sigmoidoscopy or colonoscopy. The polyps are then tested for cancer. PREVENTION  To help lower your risk of getting more colon polyps:  Eat plenty of fruits and vegetables. Avoid eating fatty foods.  Do not smoke.  Avoid drinking alcohol.  Exercise every day.  Lose weight if recommended by your caregiver.  Eat plenty of calcium and folate. Foods that are rich in calcium include milk, cheese, and broccoli. Foods that are rich in folate include chickpeas, kidney beans, and spinach. HOME CARE INSTRUCTIONS Keep all follow-up appointments as directed by your caregiver. You may need periodic exams to check for polyps. SEEK MEDICAL CARE IF: You notice bleeding during a bowel movement.   This information is not intended to replace advice given to you by your health care provider. Make sure you discuss any questions you have with your health care provider.   Document Released: 12/16/2003 Document Revised: 04/11/2014 Document Reviewed: 05/31/2011 Elsevier Interactive Patient Education [employer].

## 2015-08-13 NOTE — Op Note (Signed)
[facility] Patient Name: Thomas Summers Procedure Date: 08/13/2015 12:03 PM MRN: [id number] Date of Birth: Aug 05, 1955 Attending MD: Hildred Laser , MD CSN: [id number] Age: 60 Admit Type: Outpatient Procedure:                Colonoscopy Indications:              Screening for colorectal malignant neoplasm Providers:                Hildred Laser, MD, Rosina Lowenstein, RN, Isabella Stalling, Technician Referring MD:             Mikey Kirschner, MD Medicines:                Meperidine 50 mg IV, Midazolam 9 mg IV Complications:            No immediate complications. Estimated Blood Loss:     Estimated blood loss was minimal. Procedure:                Pre-Anesthesia Assessment:                           - Prior to the procedure, a History and Physical                            was performed, and patient medications and                            allergies were reviewed. The patient's tolerance of                            previous anesthesia was also reviewed. The risks                            and benefits of the procedure and the sedation                            options and risks were discussed with the patient.                            All questions were answered, and informed consent                            was obtained. Prior Anticoagulants: The patient has                            taken no previous anticoagulant or antiplatelet                            agents. ASA Grade Assessment: II - A patient with                            mild systemic disease. After reviewing the risks  and benefits, the patient was deemed in                            satisfactory condition to undergo the procedure.                           After obtaining informed consent, the colonoscope                            was passed under direct vision. Throughout the                            procedure, the patient's blood pressure, pulse,  and                            oxygen saturations were monitored continuously. The                            [id number]) scope was introduced through                            the anus and advanced to the the cecum, identified                            by appendiceal orifice and ileocecal valve. The                            colonoscopy was performed without difficulty. The                            patient tolerated the procedure well. The quality                            of the bowel preparation was adequate. The                            ileocecal valve, appendiceal orifice, and rectum                            were photographed. The quality of the bowel                            preparation was adequate. Scope In: 12:26:00 PM Scope Out: 12:58:16 PM Scope Withdrawal Time: 0 hours 15 minutes 17 seconds  Total Procedure Duration: 0 hours 32 minutes 16 seconds  Findings:      2 cm submucosal lesion next to appendiceal orifice suspicious for       leiomyoma.      A 3 mm polyp was found in the cecum. The polyp was sessile. Biopsies       were taken with a cold forceps for histology.      Two sessile polyps were found in the sigmoid colon and transverse colon.       The polyps were 5 mm in size. These polyps were removed with a cold  snare. Resection and retrieval were complete.      External hemorrhoids were found during retroflexion. The hemorrhoids       were small. Impression:               - 2 cm submucosal lesion next appendiceal orifice                            suspicious for leiomyoma.                           One 3 mm polyp in the cecum. Biopsied.                           - Two 5 mm polyps in the sigmoid colon and in the                            transverse colon, removed with a cold snare.                            Resected and retrieved.                           - External hemorrhoids. Moderate Sedation:      Moderate (conscious) sedation was  administered by the endoscopy nurse       and supervised by the endoscopist. The following parameters were       monitored: oxygen saturation, heart rate, blood pressure, CO2       capnography and response to care. Total physician intraservice time was       39 minutes. Recommendation:           - Patient has a contact number available for                            emergencies. The signs and symptoms of potential                            delayed complications were discussed with the                            patient. Return to normal activities tomorrow.                            Written discharge instructions were provided to the                            patient.                           - Resume previous diet today.                           - Continue present medications.                           - abdominopelvic CT to further evaluate submucosal  cecal lesion.                           - Repeat colonoscopy date to be determined after                            pending pathology results are reviewed for                            surveillance. Procedure Code(s):        --- Professional ---                           [phone], Colonoscopy, flexible; with removal of                            tumor(s), polyp(s), or other lesion(s) by snare                            technique                           45380, 59, Colonoscopy, flexible; with biopsy,                            single or multiple                           99152, Moderate sedation services provided by the                            same physician or other qualified health care                            professional performing the diagnostic or                            therapeutic service that the sedation supports,                            requiring the presence of an independent trained                            observer to assist in the monitoring of the                             patient's level of consciousness and physiological                            status; initial 15 minutes of intraservice time,                            patient age 16 years or older                           [phone], Moderate sedation services; each additional  15 minutes intraservice time                           99153, Moderate sedation services; each additional                            15 minutes intraservice time Diagnosis Code(s):        --- Professional ---                           Z12.11, Encounter for screening for malignant                            neoplasm of colon                           D12.0, Benign neoplasm of cecum                           D12.5, Benign neoplasm of sigmoid colon                           D12.3, Benign neoplasm of transverse colon (hepatic                            flexure or splenic flexure)                           K64.4, Residual hemorrhoidal skin tags CPT copyright 2016 American Medical Association. All rights reserved. The codes documented in this report are preliminary and upon coder review may  be revised to meet current compliance requirements. Hildred Laser, MD Hildred Laser, MD 08/13/2015 1:13:36 PM This report has been signed electronically. Number of Addenda: 0

## 2015-08-17 ENCOUNTER — Encounter (HOSPITAL_COMMUNITY): Payer: Self-pay | Admitting: Internal Medicine

## 2015-10-02 ENCOUNTER — Ambulatory Visit: Payer: 59 | Admitting: Family Medicine

## 2015-10-05 ENCOUNTER — Ambulatory Visit: Payer: 59 | Admitting: Family Medicine

## 2015-10-07 ENCOUNTER — Encounter: Payer: Self-pay | Admitting: Family Medicine

## 2015-10-07 ENCOUNTER — Ambulatory Visit (INDEPENDENT_AMBULATORY_CARE_PROVIDER_SITE_OTHER): Payer: 59 | Admitting: Family Medicine

## 2015-10-07 VITALS — BP 134/78 | Ht 73.5 in | Wt 214.4 lb

## 2015-10-07 DIAGNOSIS — N400 Enlarged prostate without lower urinary tract symptoms: Secondary | ICD-10-CM | POA: Diagnosis not present

## 2015-10-07 DIAGNOSIS — E119 Type 2 diabetes mellitus without complications: Secondary | ICD-10-CM | POA: Diagnosis not present

## 2015-10-07 DIAGNOSIS — I10 Essential (primary) hypertension: Secondary | ICD-10-CM | POA: Diagnosis not present

## 2015-10-07 LAB — POCT GLYCOSYLATED HEMOGLOBIN (HGB A1C): HEMOGLOBIN A1C: 5

## 2015-10-07 MED ORDER — TAMSULOSIN HCL 0.4 MG PO CAPS: 0.4000 mg | ORAL_CAPSULE | Freq: Every day | ORAL | Status: DC

## 2015-10-07 MED ORDER — LISINOPRIL 5 MG PO TABS: 5.0000 mg | ORAL_TABLET | Freq: Every day | ORAL | Status: DC

## 2015-10-07 NOTE — Progress Notes (Signed)
Subjective:    Patient ID: Thomas Summers, male    DOB: 21-Feb-1956, 60 y.o.   MRN: [id number]  Diabetes He presents for his follow-up diabetic visit. He has type 2 diabetes mellitus. There are no hypoglycemic associated symptoms. There are no diabetic associated symptoms. There are no hypoglycemic complications. There are no diabetic complications. There are no known risk factors for coronary artery disease. Current diabetic treatment includes diet. He is compliant with treatment all of the time.   Patient needs refills on testing strips.   Last diabetic eye exam - Dec 2016, No retinopathy  Patient states that his blood sugar drops and he gets dizzy after eating.   Results for orders placed or performed during the hospital encounter of 06/27/15  Blood culture (routine x 2)  Result Value Ref Range   Specimen Description BLOOD RIGHT ANTECUBITAL    Special Requests BOTTLES DRAWN AEROBIC AND ANAEROBIC 6CC EACH    Culture NO GROWTH 5 DAYS    Report Status 07/02/2015 FINAL   Blood culture (routine x 2)  Result Value Ref Range   Specimen Description BLOOD RIGHT ANTECUBITAL    Special Requests BOTTLES DRAWN AEROBIC AND ANAEROBIC 6CC EACH    Culture NO GROWTH 5 DAYS    Report Status 07/02/2015 FINAL   CBC with Differential  Result Value Ref Range   WBC 18.5 (H) 4.0 - 10.5 K/uL   RBC 4.75 4.22 - 5.81 MIL/uL   Hemoglobin 15.1 13.0 - 17.0 g/dL   HCT 42.9 39.0 - 52.0 %   MCV 90.3 78.0 - 100.0 fL   MCH 31.8 26.0 - 34.0 pg   MCHC 35.2 30.0 - 36.0 g/dL   RDW 14.2 11.5 - 15.5 %   Platelets 200 150 - 400 K/uL   Neutrophils Relative % 85 %   Neutro Abs 15.8 (H) 1.7 - 7.7 K/uL   Lymphocytes Relative 8 %   Lymphs Abs 1.4 0.7 - 4.0 K/uL   Monocytes Relative 6 %   Monocytes Absolute 1.2 (H) 0.1 - 1.0 K/uL   Eosinophils Relative 1 %   Eosinophils Absolute 0.1 0.0 - 0.7 K/uL   Basophils Relative 0 %   Basophils Absolute 0.0 0.0 - 0.1 K/uL  Lactic acid, plasma  Result Value Ref Range   Lactic Acid, Venous 0.9 0.5 - 2.0 mmol/L  Basic metabolic panel  Result Value Ref Range   Sodium 135 135 - 145 mmol/L   Potassium 3.5 3.5 - 5.1 mmol/L   Chloride 104 101 - 111 mmol/L   CO2 20 (L) 22 - 32 mmol/L   Glucose, Bld 99 65 - 99 mg/dL   BUN 18 6 - 20 mg/dL   Creatinine, Ser 0.97 0.61 - 1.24 mg/dL   Calcium 8.9 8.9 - 10.3 mg/dL   GFR calc non Af Amer >60 >60 mL/min   GFR calc Af Amer >60 >60 mL/min   Anion gap 11 5 - 15  Urinalysis, Routine w reflex microscopic (not at [facility])  Result Value Ref Range   Color, Urine YELLOW YELLOW   APPearance CLEAR CLEAR   Specific Gravity, Urine 1.010 1.005 - 1.030   pH 6.0 5.0 - 8.0   Glucose, UA NEGATIVE NEGATIVE mg/dL   Hgb urine dipstick SMALL (A) NEGATIVE   Bilirubin Urine NEGATIVE NEGATIVE   Ketones, ur 15 (A) NEGATIVE mg/dL   Protein, ur NEGATIVE NEGATIVE mg/dL   Nitrite NEGATIVE NEGATIVE   Leukocytes, UA SMALL (A) NEGATIVE  Urine microscopic-add on  Result Value  Ref Range   Squamous Epithelial / LPF NONE SEEN NONE SEEN   WBC, UA 6-30 0 - 5 WBC/hpf   RBC / HPF 6-30 0 - 5 RBC/hpf   Bacteria, UA RARE (A) NONE SEEN  CBG monitoring, ED  Result Value Ref Range   Glucose-Capillary 97 65 - 99 mg/dL   Comment 1 Document in Chart    Pt had periods of sugar dropping , after meals.   ptworking in yard, got light headed, had glu og 68 snf felt a little queazzy [facility]   Watching better diet, has cut out soda and occas sugar with special occasions   Compliant blood pressure. No obvious side effects with medicine. Blood pressures generally good  Frequent urination. Urinating at night. Several times per night most nights worse for years  This pat Monday [place]    Review of Systems    No headache, no major weight loss or weight gain, no chest pain no back pain abdominal pain no change in bowel habits complete ROS otherwise negative  Objective:   Physical Exam Alert vitals  stable blood pressure good on repeat HEENT normal lungs clear heart rare rhythm ankles without edema       Assessment & Plan:  Impression 1 type 2 diabetes control much improved discussed #2 "low sugar spells" likely more relative hypoglycemia. Patient on no medicines discussed at length #3 hypertension good control #4 prostate hypertrophy discussed time for medicines benefits discussed risks discussed WSL 25 minutes spent most in discussion

## 2016-01-20 LAB — HM DIABETES EYE EXAM

## 2016-04-08 ENCOUNTER — Other Ambulatory Visit: Payer: Self-pay | Admitting: *Deleted

## 2016-04-08 MED ORDER — BLOOD GLUCOSE MONITOR KIT: PACK | 5 refills | Status: AC

## 2016-04-12 ENCOUNTER — Ambulatory Visit (INDEPENDENT_AMBULATORY_CARE_PROVIDER_SITE_OTHER): Payer: 59 | Admitting: Family Medicine

## 2016-04-12 ENCOUNTER — Encounter: Payer: Self-pay | Admitting: Family Medicine

## 2016-04-12 ENCOUNTER — Ambulatory Visit: Payer: 59 | Admitting: Family Medicine

## 2016-04-12 VITALS — BP 128/84 | Ht 73.5 in | Wt 228.0 lb

## 2016-04-12 DIAGNOSIS — E1142 Type 2 diabetes mellitus with diabetic polyneuropathy: Secondary | ICD-10-CM | POA: Insufficient documentation

## 2016-04-12 DIAGNOSIS — N400 Enlarged prostate without lower urinary tract symptoms: Secondary | ICD-10-CM

## 2016-04-12 DIAGNOSIS — I10 Essential (primary) hypertension: Secondary | ICD-10-CM | POA: Diagnosis not present

## 2016-04-12 DIAGNOSIS — Z125 Encounter for screening for malignant neoplasm of prostate: Secondary | ICD-10-CM

## 2016-04-12 DIAGNOSIS — Z13220 Encounter for screening for lipoid disorders: Secondary | ICD-10-CM | POA: Diagnosis not present

## 2016-04-12 DIAGNOSIS — E119 Type 2 diabetes mellitus without complications: Secondary | ICD-10-CM

## 2016-04-12 DIAGNOSIS — E0842 Diabetes mellitus due to underlying condition with diabetic polyneuropathy: Secondary | ICD-10-CM

## 2016-04-12 LAB — POCT GLYCOSYLATED HEMOGLOBIN (HGB A1C): HEMOGLOBIN A1C: 5

## 2016-04-12 MED ORDER — LISINOPRIL 10 MG PO TABS: 10.0000 mg | ORAL_TABLET | Freq: Every day | ORAL | 5 refills | Status: DC

## 2016-04-12 MED ORDER — TAMSULOSIN HCL 0.4 MG PO CAPS: 0.4000 mg | ORAL_CAPSULE | Freq: Every day | ORAL | 5 refills | Status: DC

## 2016-04-12 NOTE — Progress Notes (Signed)
   Subjective:    Patient ID: Thomas Summers, male    DOB: 01/30/1956, 61 y.o.   MRN: [id number] Patient arrives with numerous concerns Diabetes  He presents for his follow-up diabetic visit. He has type 2 diabetes mellitus. Risk factors for coronary artery disease include diabetes mellitus and hypertension. Current diabetic treatment includes diet. He is compliant with treatment all of the time. His weight is stable. He is following a diabetic diet. He has not had a previous visit with a dietitian. He does not see a podiatrist.Eye exam is current.   Results for orders placed or performed in visit on 04/12/16  POCT glycosylated hemoglobin (Hb A1C)  Result Value Ref Range   Hemoglobin A1C 5.0   HM DIABETES EYE EXAM  Result Value Ref Range   HM Diabetic Eye Exam No Retinopathy No Retinopathy    cks glucoses geenrlaly in the nineties, a few over 100  One touch 111  Contour 94  Needs controlled   Blood pressure medicine and blood pressure levels reviewed today with patient. Compliant with blood pressure medicine. States does not miss a dose. No obvious side effects. Blood pressure generally good when checked elsewhere. Watching salt intake.  Still gets up nightly, but not as frequent. One or so les per night. freq urin still  , flomax overall has helped  140 s 80s   Notes tingling this in the feet. Distal feet. Particular he plantar surface. On feeling.   Review of Systems No headache, no major weight loss or weight gain, no chest pain no back pain abdominal pain no change in bowel habits complete ROS otherwise negative     Objective:   Physical Exam  Alert vitals stable, NAD. Blood pressure good on repeat. HEENT normal. Lungs clear. Heart regular rate and rhythm. Blood pressure 146/92 both arms on repeat Distal feet pulses intact sensation grossly intact     Assessment & Plan:  Impression 1 Htn suboptimum control discussed need increase medicine #2 prostate  hypertrophy ongoing need for meds discussed maintain same #3 type 2 diabetes excellent A1c no need for meds diet exercise discussed in encourage #4 diabetic sensory neuropathy long discussion held this can occur even with good tight control of sugars. No need for neurology workup at this time. Wellness exam in 3 months chronic exam in 6 months walking out the door patient brought up erectile dysfunction I have encouraged him to set up separate

## 2016-04-22 ENCOUNTER — Ambulatory Visit: Payer: 59 | Admitting: Family Medicine

## 2016-04-26 ENCOUNTER — Ambulatory Visit (INDEPENDENT_AMBULATORY_CARE_PROVIDER_SITE_OTHER): Payer: 59 | Admitting: Family Medicine

## 2016-04-26 ENCOUNTER — Encounter: Payer: Self-pay | Admitting: Family Medicine

## 2016-04-26 DIAGNOSIS — N5201 Erectile dysfunction due to arterial insufficiency: Secondary | ICD-10-CM

## 2016-04-26 MED ORDER — SILDENAFIL CITRATE 20 MG PO TABS: ORAL_TABLET | ORAL | 5 refills | Status: DC

## 2016-04-26 NOTE — Progress Notes (Signed)
   Subjective:    Patient ID: Thomas Summers, male    DOB: April 18, 1955, 61 y.o.   MRN: [id number]  HPI Patient is here today to discuss some erectile dysfunction issues. Patient is doing well. Patient has no other concerns at this time.   Partial erection but not as good as it could   Did not smoke  No challenges with erections in the early years  bp several yrs , Under treatment. Not on any medication associated. Next  History of diabetes. Is had for several years. Next  Progressive difficulties erections over recent years. Difficulty maintaining an appropriate erection. Would like to consider medication in this regard.   Review of Systems No headache, no major weight loss or weight gain, no chest pain no back pain abdominal pain no change in bowel habits complete ROS otherwise negative     Objective:   Physical Exam  Alert vitals stable, NAD. Blood pressure good on repeat. HEENT normal. Lungs clear. Heart regular rate and rhythm.       Assessment & Plan:  Impression erectile dysfunction discussed at great length. 25 minutes spent greater than 50% the time in discussion of diagnosis manifestations. Underlying treatment. Long discussion held regarding testosterone, patient actively discouraged from testing and definitely I only recommend prescribing if patient postsurgical or postradiation etc. Different medicines discussed. Patient would like to have a trial of generic Viagra. Side effects benefits discussed

## 2016-05-01 DIAGNOSIS — N529 Male erectile dysfunction, unspecified: Secondary | ICD-10-CM | POA: Insufficient documentation

## 2016-06-27 DIAGNOSIS — J019 Acute sinusitis, unspecified: Secondary | ICD-10-CM | POA: Diagnosis not present

## 2016-07-04 DIAGNOSIS — Z13220 Encounter for screening for lipoid disorders: Secondary | ICD-10-CM | POA: Diagnosis not present

## 2016-07-04 DIAGNOSIS — I10 Essential (primary) hypertension: Secondary | ICD-10-CM | POA: Diagnosis not present

## 2016-07-04 DIAGNOSIS — E119 Type 2 diabetes mellitus without complications: Secondary | ICD-10-CM | POA: Diagnosis not present

## 2016-07-05 LAB — BASIC METABOLIC PANEL
BUN / CREAT RATIO: 17 (ref 10–24)
BUN: 15 mg/dL (ref 8–27)
CO2: 21 mmol/L (ref 18–29)
CREATININE: 0.89 mg/dL (ref 0.76–1.27)
Calcium: 8.8 mg/dL (ref 8.6–10.2)
Chloride: 99 mmol/L (ref 96–106)
GFR calc Af Amer: 107 mL/min/{1.73_m2} (ref >59)
GFR calc non Af Amer: 93 mL/min/{1.73_m2} (ref >59)
GLUCOSE: 102 mg/dL — AB (ref 65–99)
POTASSIUM: 4.3 mmol/L (ref 3.5–5.2)
SODIUM: 141 mmol/L (ref 134–144)

## 2016-07-05 LAB — HEPATIC FUNCTION PANEL
ALK PHOS: 97 IU/L (ref 39–117)
ALT: 77 IU/L — ABNORMAL HIGH (ref 0–44)
AST: 35 IU/L (ref 0–40)
Albumin: 4.6 g/dL (ref 3.6–4.8)
BILIRUBIN, DIRECT: 0.09 mg/dL (ref 0.00–0.40)
Bilirubin Total: 0.4 mg/dL (ref 0.0–1.2)
TOTAL PROTEIN: 7.4 g/dL (ref 6.0–8.5)

## 2016-07-05 LAB — LIPID PANEL
CHOLESTEROL TOTAL: 209 mg/dL — AB (ref 100–199)
Chol/HDL Ratio: 4.3 ratio (ref 0.0–5.0)
HDL: 49 mg/dL (ref >39)
LDL Calculated: 128 mg/dL — ABNORMAL HIGH (ref 0–99)
TRIGLYCERIDES: 162 mg/dL — AB (ref 0–149)
VLDL CHOLESTEROL CAL: 32 mg/dL (ref 5–40)

## 2016-07-05 LAB — PSA: PROSTATE SPECIFIC AG, SERUM: 1.8 ng/mL (ref 0.0–4.0)

## 2016-07-05 LAB — MICROALBUMIN / CREATININE URINE RATIO
CREATININE, UR: 126.3 mg/dL
MICROALBUM., U, RANDOM: 10 ug/mL
Microalb/Creat Ratio: 7.9 mg/g creat (ref 0.0–30.0)

## 2016-07-12 ENCOUNTER — Encounter: Payer: Self-pay | Admitting: Family Medicine

## 2016-07-12 ENCOUNTER — Ambulatory Visit (INDEPENDENT_AMBULATORY_CARE_PROVIDER_SITE_OTHER): Payer: 59 | Admitting: Family Medicine

## 2016-07-12 VITALS — BP 136/86 | Ht 73.5 in | Wt 226.0 lb

## 2016-07-12 DIAGNOSIS — Z0000 Encounter for general adult medical examination without abnormal findings: Secondary | ICD-10-CM | POA: Diagnosis not present

## 2016-07-12 MED ORDER — TAMSULOSIN HCL 0.4 MG PO CAPS: 0.8000 mg | ORAL_CAPSULE | Freq: Every day | ORAL | 5 refills | Status: DC

## 2016-07-12 NOTE — Patient Instructions (Signed)

## 2016-07-12 NOTE — Progress Notes (Signed)
Subjective:    Patient ID: Thomas Summers, male    DOB: February 06, 1956, 61 y.o.   MRN: [id number]  HPI The patient comes in today for a wellness visit.    A review of their health history was completed.  A review of medications was also completed.  Any needed refills; none  Eating habits: not health conscious  Falls/  MVA accidents in past few months: none  Regular exercise: walking 20 mins three times a week.   Specialist pt sees on regular basis: none  Preventative health issues were discussed.   Additional concerns: none  Results for orders placed or performed in visit on 04/12/16  Lipid panel  Result Value Ref Range   Cholesterol, Total 209 (H) 100 - 199 mg/dL   Triglycerides 162 (H) 0 - 149 mg/dL   HDL 49 >39 mg/dL   VLDL Cholesterol Cal 32 5 - 40 mg/dL   LDL Calculated 128 (H) 0 - 99 mg/dL   Chol/HDL Ratio 4.3 0.0 - 5.0 ratio  Hepatic function panel  Result Value Ref Range   Total Protein 7.4 6.0 - 8.5 g/dL   Albumin 4.6 3.6 - 4.8 g/dL   Bilirubin Total 0.4 0.0 - 1.2 mg/dL   Bilirubin, Direct 0.09 0.00 - 0.40 mg/dL   Alkaline Phosphatase 97 39 - 117 IU/L   AST 35 0 - 40 IU/L   ALT 77 (H) 0 - 44 IU/L  Basic metabolic panel  Result Value Ref Range   Glucose 102 (H) 65 - 99 mg/dL   BUN 15 8 - 27 mg/dL   Creatinine, Ser 0.89 0.76 - 1.27 mg/dL   GFR calc non Af Amer 93 >59 mL/min/1.73   GFR calc Af Amer 107 >59 mL/min/1.73   BUN/Creatinine Ratio 17 10 - 24   Sodium 141 134 - 144 mmol/L   Potassium 4.3 3.5 - 5.2 mmol/L   Chloride 99 96 - 106 mmol/L   CO2 21 18 - 29 mmol/L   Calcium 8.8 8.6 - 10.2 mg/dL  PSA  Result Value Ref Range   Prostate Specific Ag, Serum 1.8 0.0 - 4.0 ng/mL  Microalbumin / creatinine urine ratio  Result Value Ref Range   Creatinine, Urine 126.3 Not Estab. mg/dL   Albumin, Urine 10.0 Not Estab. ug/mL   Microalb/Creat Ratio 7.9 0.0 - 30.0 mg/g creat  POCT glycosylated hemoglobin (Hb A1C)  Result Value Ref Range   Hemoglobin  A1C 5.0   HM DIABETES EYE EXAM  Result Value Ref Range   HM Diabetic Eye Exam No Retinopathy No Retinopathy    No hx of shingles   Exercise not so bad the last few months, did really well last yr, active not as much   Diet not good, poor  Colonoscopy done Aug 13 2015  Review of Systems  Constitutional: Negative for activity change, appetite change and fever.  HENT: Negative for congestion and rhinorrhea.   Eyes: Negative for discharge.  Respiratory: Negative for cough and wheezing.   Cardiovascular: Negative for chest pain.  Gastrointestinal: Negative for abdominal pain, blood in stool and vomiting.  Genitourinary: Negative for difficulty urinating and frequency.  Musculoskeletal: Negative for neck pain.  Skin: Negative for rash.  Allergic/Immunologic: Negative for environmental allergies and food allergies.  Neurological: Negative for weakness and headaches.  Psychiatric/Behavioral: Negative for agitation.  All other systems reviewed and are negative.      Objective:   Physical Exam  Constitutional: He appears well-developed and well-nourished.  HENT:  Head:  Normocephalic and atraumatic.  Right Ear: External ear normal.  Left Ear: External ear normal.  Nose: Nose normal.  Mouth/Throat: Oropharynx is clear and moist.  Eyes: EOM are normal. Pupils are equal, round, and reactive to light.  Neck: Normal range of motion. Neck supple. No thyromegaly present.  Cardiovascular: Normal rate, regular rhythm and normal heart sounds.   No murmur heard. Pulmonary/Chest: Effort normal and breath sounds normal. No respiratory distress. He has no wheezes.  Abdominal: Soft. Bowel sounds are normal. He exhibits no distension and no mass. There is no tenderness.  Genitourinary: Penis normal.  Genitourinary Comments: Diffuse moderate enlargement of prostate nontender non-boggy smooth consistency  Musculoskeletal: Normal range of motion. He exhibits no edema.  Lymphadenopathy:    He has  no cervical adenopathy.  Neurological: He is alert. He exhibits normal muscle tone.  Skin: Skin is warm and dry. No erythema.  Psychiatric: He has a normal mood and affect. His behavior is normal. Judgment normal.  Vitals reviewed.         Assessment & Plan:  Impression well adult exam. Up-to-date on colonoscopy. 2 shingles vaccine. Discussed. She works prescribed. Diet exercise prescribed. #2 prostate hypertrophy will increase Flomax. Rationale discussed.

## 2016-10-10 ENCOUNTER — Ambulatory Visit (INDEPENDENT_AMBULATORY_CARE_PROVIDER_SITE_OTHER): Payer: 59 | Admitting: Family Medicine

## 2016-10-10 ENCOUNTER — Encounter: Payer: Self-pay | Admitting: Family Medicine

## 2016-10-10 VITALS — BP 152/82 | Ht 73.5 in | Wt 225.0 lb

## 2016-10-10 DIAGNOSIS — I10 Essential (primary) hypertension: Secondary | ICD-10-CM | POA: Diagnosis not present

## 2016-10-10 DIAGNOSIS — N5201 Erectile dysfunction due to arterial insufficiency: Secondary | ICD-10-CM | POA: Diagnosis not present

## 2016-10-10 DIAGNOSIS — N400 Enlarged prostate without lower urinary tract symptoms: Secondary | ICD-10-CM

## 2016-10-10 DIAGNOSIS — E119 Type 2 diabetes mellitus without complications: Secondary | ICD-10-CM

## 2016-10-10 LAB — POCT GLYCOSYLATED HEMOGLOBIN (HGB A1C): Hemoglobin A1C: 5.2

## 2016-10-10 MED ORDER — LISINOPRIL 20 MG PO TABS: 20.0000 mg | ORAL_TABLET | Freq: Every day | ORAL | 5 refills | Status: DC

## 2016-10-10 MED ORDER — TAMSULOSIN HCL 0.4 MG PO CAPS: 0.8000 mg | ORAL_CAPSULE | Freq: Every day | ORAL | 5 refills | Status: DC

## 2016-10-10 NOTE — Progress Notes (Signed)
   Subjective:    Patient ID: Thomas Summers, male    DOB: 04-Oct-1955, 61 y.o.   MRN: [id number] Patient arrives with numerous concerns Diabetes  He presents for his follow-up diabetic visit. He has type 2 diabetes mellitus. No MedicAlert identification noted. The initial diagnosis of diabetes was made 2 years ago. His disease course has been stable. Hypoglycemia symptoms include confusion and dizziness. Associated symptoms include foot paresthesias. Symptoms are stable. He has not had a previous visit with a dietitian. He does not see a podiatrist.Eye exam is current.    No complaints. Results for orders placed or performed in visit on 10/10/16  POCT glycosylated hemoglobin (Hb A1C)  Result Value Ref Range   Hemoglobin A1C 5.2    Blood pressure medicine and blood pressure levels reviewed today with patient. Compliant with blood pressure medicine. States does not miss a dose. No obvious side effects. Blood pressure generally good when checked elsewhere. Watching salt intake.   Patient claims compliance with diabetes medication. No obvious side effects. Reports no substantial low sugar spells. Most numbers are generally in good range when checked fasting. Generally does not miss a dose of medication. Watching diabetic diet closely  Morn numbers around 100, switched to one touch, not sure of the number, seems to read high    Erect dysfunction, The medicines helped some but not a lot. Patient will still use intermittently. Has some flushing. Some headache with it.  States Flomax overall is helping some. Not perfectly but improved. No obvious side effects.  Diet good, rel tight watching numers  execise good and in control   Upper 130s syst  Upper 80 s diastolic         Review of Systems  Neurological: Positive for dizziness.  Psychiatric/Behavioral: Positive for confusion.       Objective:   Physical Exam  Alert and oriented, vitals reviewed and stable, NAD ENT-TM's  and ext canals WNL bilat via otoscopic exam Soft palate, tonsils and post pharynx WNL via oropharyngeal exam Neck-symmetric, no masses; thyroid nonpalpable and nontender Pulmonary-no tachypnea or accessory muscle use; Clear without wheezes via auscultation Card--no abnrml murmurs, rhythm reg and rate WNL Carotid pulses symmetric, without bruits  C diabetic foot exam      Assessment & Plan:  Impression 1 type 2 diabetes tight control discussed maintain same #2 hypertension not good control discussed, we'll increase medication rationale discussed #3 erectile dysfunction discussed #4 prostate hypertrophy with secondary symptoms discussed #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 Lisinopril increased diet exercise discussed follow-up in 6 months for chronic visit. Patient likes to do separate from wellness

## 2016-11-07 DIAGNOSIS — H9113 Presbycusis, bilateral: Secondary | ICD-10-CM | POA: Diagnosis not present

## 2016-11-07 DIAGNOSIS — H7012 Chronic mastoiditis, left ear: Secondary | ICD-10-CM | POA: Diagnosis not present

## 2017-01-09 DIAGNOSIS — J069 Acute upper respiratory infection, unspecified: Secondary | ICD-10-CM | POA: Diagnosis not present

## 2017-01-10 DIAGNOSIS — H90A21 Sensorineural hearing loss, unilateral, right ear, with restricted hearing on the contralateral side: Secondary | ICD-10-CM | POA: Diagnosis not present

## 2017-01-10 DIAGNOSIS — H90A32 Mixed conductive and sensorineural hearing loss, unilateral, left ear with restricted hearing on the contralateral side: Secondary | ICD-10-CM | POA: Diagnosis not present

## 2017-01-12 ENCOUNTER — Encounter: Payer: Self-pay | Admitting: Family Medicine

## 2017-01-12 ENCOUNTER — Ambulatory Visit (INDEPENDENT_AMBULATORY_CARE_PROVIDER_SITE_OTHER): Payer: 59 | Admitting: Family Medicine

## 2017-01-12 VITALS — BP 132/84 | Ht 73.5 in | Wt 234.0 lb

## 2017-01-12 DIAGNOSIS — R21 Rash and other nonspecific skin eruption: Secondary | ICD-10-CM

## 2017-01-12 MED ORDER — DOXYCYCLINE HYCLATE 100 MG PO TABS: 100.0000 mg | ORAL_TABLET | Freq: Two times a day (BID) | ORAL | 0 refills | Status: DC

## 2017-01-12 NOTE — Progress Notes (Signed)
   Subjective:    Patient ID: Thomas Summers, male    DOB: 10-20-1955, 61 y.o.   MRN: [id number]  HPI  Patient arrives with c/o places on both hips that are not healing-currently on antibiotic for sinus sx from urgent care.  Has had a couple boils crop up over the [past wk  Not healing well   Has been on antibiotic at [facility] a turgicare and given a antibiotic     Took z pk  m Review of Systems No headache, no major weight loss or weight gain, no chest pain no back pain abdominal pain no change in bowel habits complete ROS otherwise negative     Objective:   Physical Exam  Alert vitals stable, NAD. Blood pressure good on repeat. HEENT normal. Lungs clear. Heart regular rate and rhythm. Two small abscess-like lesions along with folliculitis on biotics upper thigh      Assessment & Plan:  Abscess and cellulitis plan appropriate antibiotics prescribed local measures discussed recheck her persists

## 2017-01-18 LAB — HM DIABETES EYE EXAM

## 2017-01-20 DIAGNOSIS — Z23 Encounter for immunization: Secondary | ICD-10-CM | POA: Diagnosis not present

## 2017-02-09 DIAGNOSIS — H52223 Regular astigmatism, bilateral: Secondary | ICD-10-CM | POA: Diagnosis not present

## 2017-02-09 DIAGNOSIS — H5203 Hypermetropia, bilateral: Secondary | ICD-10-CM | POA: Diagnosis not present

## 2017-02-09 DIAGNOSIS — I10 Essential (primary) hypertension: Secondary | ICD-10-CM | POA: Diagnosis not present

## 2017-04-12 ENCOUNTER — Ambulatory Visit: Payer: 59 | Admitting: Family Medicine

## 2017-04-12 ENCOUNTER — Encounter: Payer: Self-pay | Admitting: Family Medicine

## 2017-04-12 VITALS — BP 128/80 | Ht 73.5 in | Wt 230.8 lb

## 2017-04-12 DIAGNOSIS — I10 Essential (primary) hypertension: Secondary | ICD-10-CM | POA: Diagnosis not present

## 2017-04-12 DIAGNOSIS — E0842 Diabetes mellitus due to underlying condition with diabetic polyneuropathy: Secondary | ICD-10-CM | POA: Diagnosis not present

## 2017-04-12 DIAGNOSIS — N5201 Erectile dysfunction due to arterial insufficiency: Secondary | ICD-10-CM | POA: Diagnosis not present

## 2017-04-12 DIAGNOSIS — E119 Type 2 diabetes mellitus without complications: Secondary | ICD-10-CM

## 2017-04-12 DIAGNOSIS — N400 Enlarged prostate without lower urinary tract symptoms: Secondary | ICD-10-CM | POA: Diagnosis not present

## 2017-04-12 LAB — POCT GLYCOSYLATED HEMOGLOBIN (HGB A1C): HEMOGLOBIN A1C: 5.6

## 2017-04-12 MED ORDER — LISINOPRIL 20 MG PO TABS: 20.0000 mg | ORAL_TABLET | Freq: Every day | ORAL | 5 refills | Status: DC

## 2017-04-12 MED ORDER — SILDENAFIL CITRATE 20 MG PO TABS: ORAL_TABLET | ORAL | 5 refills | Status: DC

## 2017-04-12 MED ORDER — TAMSULOSIN HCL 0.4 MG PO CAPS: 0.8000 mg | ORAL_CAPSULE | Freq: Every day | ORAL | 5 refills | Status: DC

## 2017-04-12 NOTE — Progress Notes (Signed)
   Subjective:    Patient ID: Thomas Summers, male    DOB: 11-30-1955, 62 y.o.   MRN: [id number]  Diabetes  He presents for his follow-up diabetic visit. He has type 2 diabetes mellitus. Risk factors for coronary artery disease include hypertension. Current diabetic treatment includes diet. He is compliant with treatment all of the time. His weight is stable. He is following a diabetic diet. He has not had a previous visit with a dietitian. He does not see a podiatrist.Eye exam is current.    Results for orders placed or performed in visit on 04/12/17  HM DIABETES EYE EXAM  Result Value Ref Range   HM Diabetic Eye Exam No Retinopathy No Retinopathy   Results for orders placed or performed in visit on 04/12/17  POCT glycosylated hemoglobin (Hb A1C)  Result Value Ref Range   Hemoglobin A1C 5.6   HM DIABETES EYE EXAM  Result Value Ref Range   HM Diabetic Eye Exam No Retinopathy No Retinopathy    Blood pressure medicine and blood pressure levels reviewed today with patient. Compliant with blood pressure medicine. States does not miss a dose. No obvious side effects. Blood pressure generally good when checked elsewhere. Watching salt intake.  /diab   Patient claims compliance with diabetes medication. No obvious side effects. Reports no substantial low sugar spells. Most numbers are generally in good range when checked fasting. Generally does not miss a dose of medication. Watching diabetic diet closely   Not as much the last mo or so   Neuropathy remins fsirly simiar , distal base of feet, symmetric  Ongoing urine flow issues, stable on flomax     Review of Systems No headache, no major weight loss or weight gain, no chest pain no back pain abdominal pain no change in bowel habits complete ROS otherwise negative     Objective:   Physical Exam  Alert and oriented, vitals reviewed and stable, NAD ENT-TM's and ext canals WNL bilat via otoscopic exam Soft palate, tonsils  and post pharynx WNL via oropharyngeal exam Neck-symmetric, no masses; thyroid nonpalpable and nontender Pulmonary-no tachypnea or accessory muscle use; Clear without wheezes via auscultation Card--no abnrml murmurs, rhythm reg and rate WNL Carotid pulses symmetric, without bruits      Assessment & Plan:  Impression good control discussed to maintain same approach patient to work harder on exercise  2.  Hypertension improved control he does well compliance discussed  3.  Prostate hypertrophy ongoing to maintain meds rationale discussed  4.  Sensory stable

## 2017-05-04 IMAGING — DX DG CHEST 2V
2 series · 2 of 2 positions shown · non-contrast
Comparison: None.

CLINICAL DATA: Fever, cough

EXAM:
CHEST  2 VIEW

[chest pa]
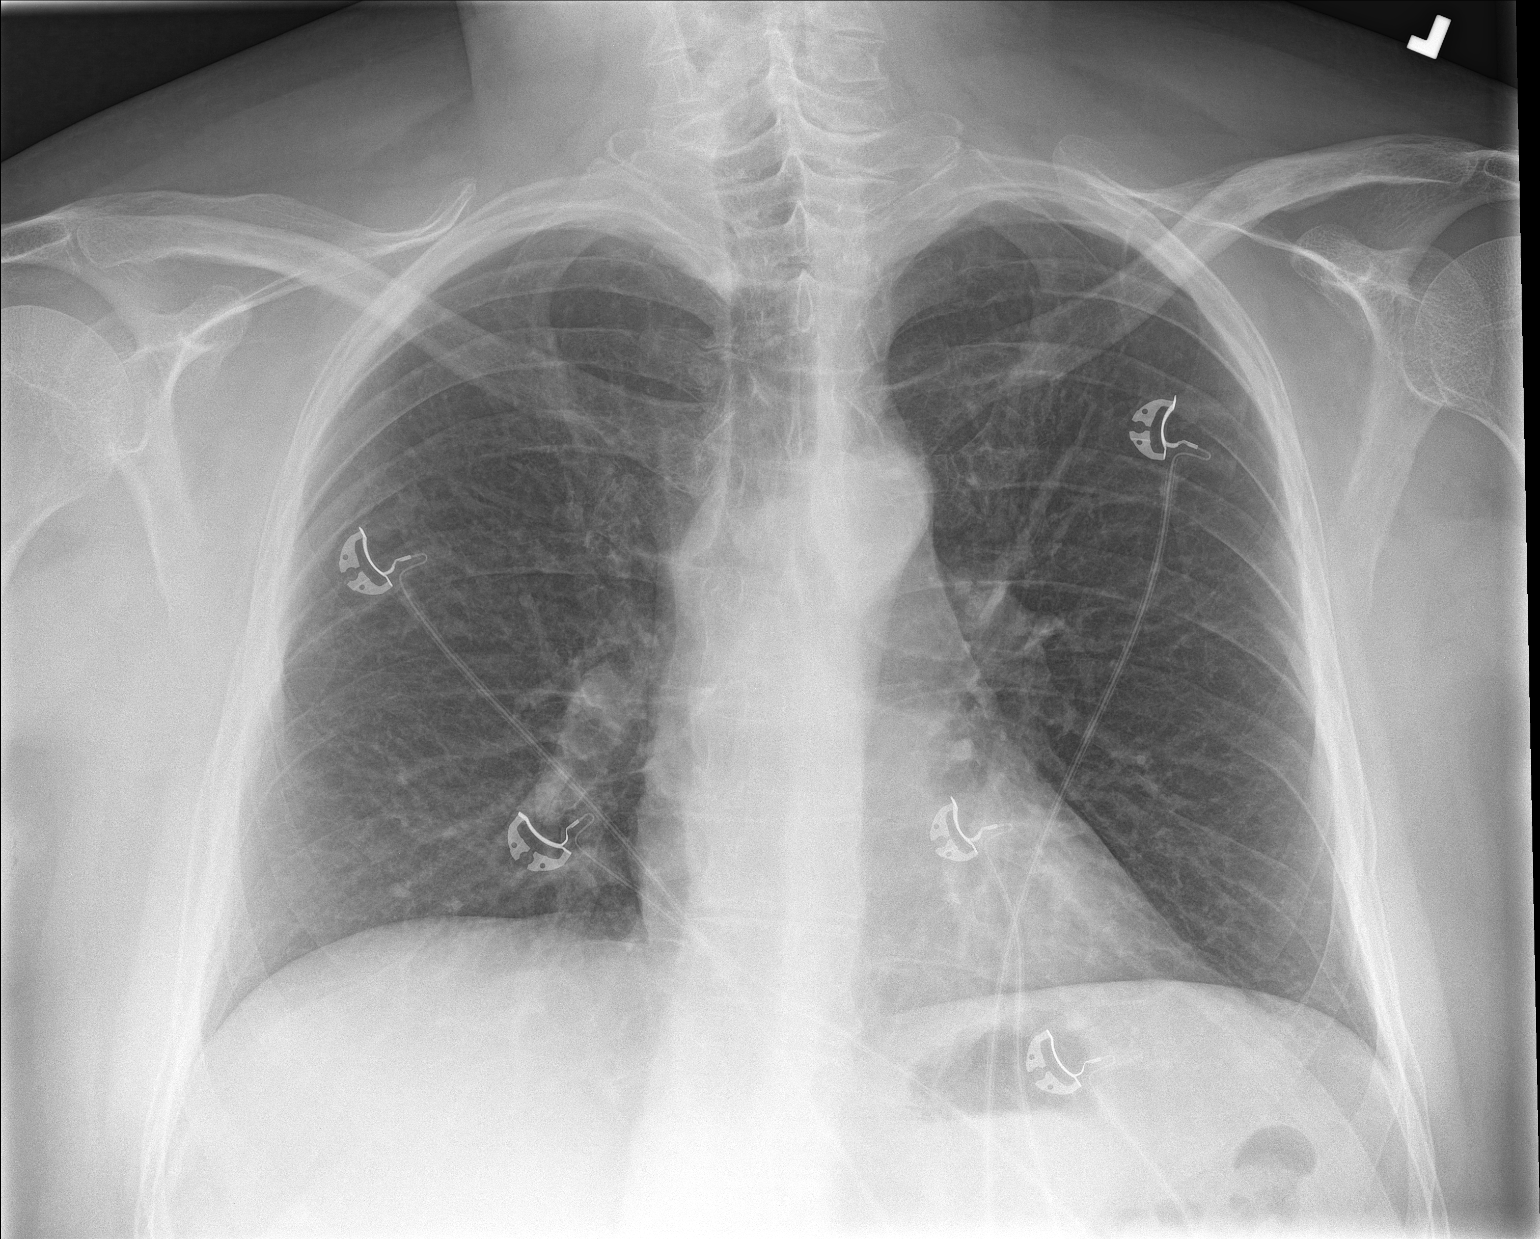

[chest lat]
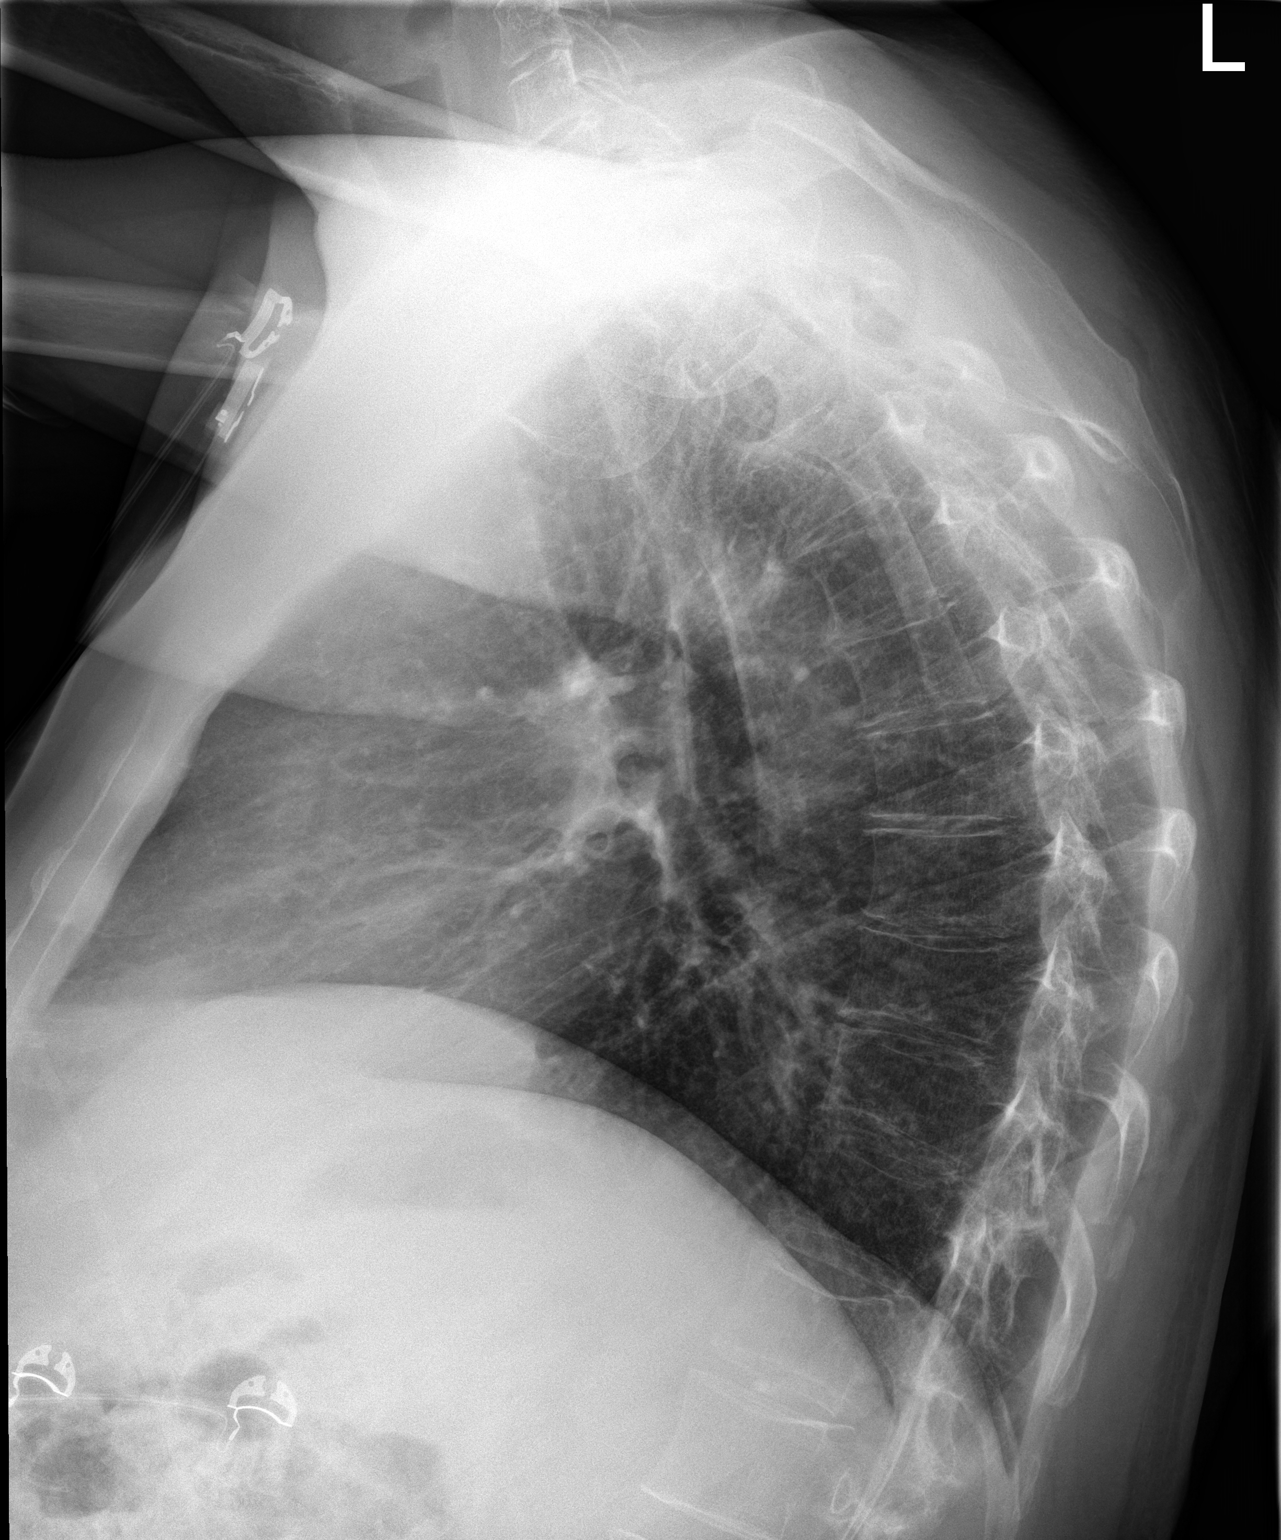

[2 of 2 positions shown; findings below may reference images not displayed]

FINDINGS: Lungs are clear.  No pleural effusion or pneumothorax.

The heart is normal in size.

Visualized osseous structures are within normal limits.
IMPRESSION: No evidence of acute cardiopulmonary disease.

## 2017-06-01 DIAGNOSIS — H903 Sensorineural hearing loss, bilateral: Secondary | ICD-10-CM | POA: Diagnosis not present

## 2017-07-10 ENCOUNTER — Telehealth: Payer: Self-pay | Admitting: Family Medicine

## 2017-07-10 DIAGNOSIS — E1342 Other specified diabetes mellitus with diabetic polyneuropathy: Secondary | ICD-10-CM

## 2017-07-10 DIAGNOSIS — Z13220 Encounter for screening for lipoid disorders: Secondary | ICD-10-CM

## 2017-07-10 DIAGNOSIS — Z125 Encounter for screening for malignant neoplasm of prostate: Secondary | ICD-10-CM

## 2017-07-10 DIAGNOSIS — Z79899 Other long term (current) drug therapy: Secondary | ICD-10-CM

## 2017-07-10 DIAGNOSIS — I10 Essential (primary) hypertension: Secondary | ICD-10-CM

## 2017-07-10 NOTE — Telephone Encounter (Signed)
Lip liv m7 psa urine protein 

## 2017-07-10 NOTE — Telephone Encounter (Signed)
Patient is aware, and orders sent.

## 2017-07-10 NOTE — Telephone Encounter (Signed)
Has an appt for a physical next week with Dr. Richardson Landry and would like an order to have bloodwork done at [place].

## 2017-07-11 DIAGNOSIS — E1342 Other specified diabetes mellitus with diabetic polyneuropathy: Secondary | ICD-10-CM | POA: Diagnosis not present

## 2017-07-11 DIAGNOSIS — Z79899 Other long term (current) drug therapy: Secondary | ICD-10-CM | POA: Diagnosis not present

## 2017-07-11 DIAGNOSIS — Z13220 Encounter for screening for lipoid disorders: Secondary | ICD-10-CM | POA: Diagnosis not present

## 2017-07-12 LAB — BASIC METABOLIC PANEL
BUN / CREAT RATIO: 14 (ref 10–24)
BUN: 14 mg/dL (ref 8–27)
CO2: 24 mmol/L (ref 20–29)
CREATININE: 0.97 mg/dL (ref 0.76–1.27)
Calcium: 9 mg/dL (ref 8.6–10.2)
Chloride: 103 mmol/L (ref 96–106)
GFR calc Af Amer: 97 mL/min/{1.73_m2} (ref >59)
GFR calc non Af Amer: 84 mL/min/{1.73_m2} (ref >59)
GLUCOSE: 96 mg/dL (ref 65–99)
Potassium: 4.3 mmol/L (ref 3.5–5.2)
Sodium: 139 mmol/L (ref 134–144)

## 2017-07-12 LAB — LIPID PANEL
CHOLESTEROL TOTAL: 184 mg/dL (ref 100–199)
Chol/HDL Ratio: 3.4 ratio (ref 0.0–5.0)
HDL: 54 mg/dL (ref >39)
LDL Calculated: 109 mg/dL — ABNORMAL HIGH (ref 0–99)
Triglycerides: 104 mg/dL (ref 0–149)
VLDL CHOLESTEROL CAL: 21 mg/dL (ref 5–40)

## 2017-07-12 LAB — MICROALBUMIN / CREATININE URINE RATIO
CREATININE, UR: 175.3 mg/dL
MICROALBUM., U, RANDOM: 22.6 ug/mL
Microalb/Creat Ratio: 12.9 mg/g creat (ref 0.0–30.0)

## 2017-07-12 LAB — HEPATIC FUNCTION PANEL
ALT: 56 IU/L — AB (ref 0–44)
AST: 27 IU/L (ref 0–40)
Albumin: 4.4 g/dL (ref 3.6–4.8)
Alkaline Phosphatase: 75 IU/L (ref 39–117)
BILIRUBIN TOTAL: 0.4 mg/dL (ref 0.0–1.2)
Bilirubin, Direct: 0.12 mg/dL (ref 0.00–0.40)
Total Protein: 6.6 g/dL (ref 6.0–8.5)

## 2017-07-12 LAB — PSA: PROSTATE SPECIFIC AG, SERUM: 3.5 ng/mL (ref 0.0–4.0)

## 2017-07-18 ENCOUNTER — Ambulatory Visit (INDEPENDENT_AMBULATORY_CARE_PROVIDER_SITE_OTHER): Payer: 59 | Admitting: Family Medicine

## 2017-07-18 ENCOUNTER — Encounter: Payer: Self-pay | Admitting: Family Medicine

## 2017-07-18 VITALS — BP 136/84 | Ht 73.5 in | Wt 232.0 lb

## 2017-07-18 DIAGNOSIS — Z0001 Encounter for general adult medical examination with abnormal findings: Secondary | ICD-10-CM | POA: Diagnosis not present

## 2017-07-18 DIAGNOSIS — I10 Essential (primary) hypertension: Secondary | ICD-10-CM

## 2017-07-18 DIAGNOSIS — N410 Acute prostatitis: Secondary | ICD-10-CM | POA: Diagnosis not present

## 2017-07-18 DIAGNOSIS — E119 Type 2 diabetes mellitus without complications: Secondary | ICD-10-CM

## 2017-07-18 DIAGNOSIS — Z0000 Encounter for general adult medical examination without abnormal findings: Secondary | ICD-10-CM

## 2017-07-18 MED ORDER — CIPROFLOXACIN HCL 500 MG PO TABS: 500.0000 mg | ORAL_TABLET | Freq: Two times a day (BID) | ORAL | 0 refills | Status: AC

## 2017-07-18 MED ORDER — LISINOPRIL 20 MG PO TABS: 20.0000 mg | ORAL_TABLET | Freq: Every day | ORAL | 5 refills | Status: DC

## 2017-07-18 MED ORDER — SILDENAFIL CITRATE 20 MG PO TABS: ORAL_TABLET | ORAL | 5 refills | Status: DC

## 2017-07-18 NOTE — Progress Notes (Signed)
Subjective:    Patient ID: Thomas Summers, male    DOB: 08/15/1955, 62 y.o.   MRN: [id number]  HPI The patient comes in today for a wellness visit.    A review of their health history was completed.  A review of medications was also completed.  Any needed refills; No  Eating habits: Not to great per pt  Falls/  MVA accidents in past few months: No  Regular exercise: Yes  Specialist pt sees on regular basis: No  Preventative health issues were discussed.   Additional concerns: None.   Exercise poor   Not doing the best with the exercise  Results for orders placed or performed in visit on 07/10/17  Hepatic function panel  Result Value Ref Range   Total Protein 6.6 6.0 - 8.5 g/dL   Albumin 4.4 3.6 - 4.8 g/dL   Bilirubin Total 0.4 0.0 - 1.2 mg/dL   Bilirubin, Direct 0.12 0.00 - 0.40 mg/dL   Alkaline Phosphatase 75 39 - 117 IU/L   AST 27 0 - 40 IU/L   ALT 56 (H) 0 - 44 IU/L  Lipid panel  Result Value Ref Range   Cholesterol, Total 184 100 - 199 mg/dL   Triglycerides 104 0 - 149 mg/dL   HDL 54 >39 mg/dL   VLDL Cholesterol Cal 21 5 - 40 mg/dL   LDL Calculated 109 (H) 0 - 99 mg/dL   Chol/HDL Ratio 3.4 0.0 - 5.0 ratio  Basic metabolic panel  Result Value Ref Range   Glucose 96 65 - 99 mg/dL   BUN 14 8 - 27 mg/dL   Creatinine, Ser 0.97 0.76 - 1.27 mg/dL   GFR calc non Af Amer 84 >59 mL/min/1.73   GFR calc Af Amer 97 >59 mL/min/1.73   BUN/Creatinine Ratio 14 10 - 24   Sodium 139 134 - 144 mmol/L   Potassium 4.3 3.5 - 5.2 mmol/L   Chloride 103 96 - 106 mmol/L   CO2 24 20 - 29 mmol/L   Calcium 9.0 8.6 - 10.2 mg/dL  Microalbumin / creatinine urine ratio  Result Value Ref Range   Creatinine, Urine 175.3 Not Estab. mg/dL   Microalbumin, Urine 22.6 Not Estab. ug/mL   Microalb/Creat Ratio 12.9 0.0 - 30.0 mg/g creat  PSA  Result Value Ref Range   Prostate Specific Ag, Serum 3.5 0.0 - 4.0 ng/mL   Pt gives blood every 8to ten week  Some mild dysuria of  late.  Some symptoms potentially consistent prostatitis  States had one low sugar spell recently.  Pyxis with some sugar tablets.  Blood pressure medicine and blood pressure levels reviewed today with patient. Compliant with blood pressure medicine. States does not miss a dose. No obvious side effects. Blood pressure generally good when checked elsewhere. Watching salt intake.   Review of Systems  Constitutional: Negative for activity change, appetite change and fever.  HENT: Negative for congestion and rhinorrhea.   Eyes: Negative for discharge.  Respiratory: Negative for cough and wheezing.   Cardiovascular: Negative for chest pain.  Gastrointestinal: Negative for abdominal pain, blood in stool and vomiting.  Genitourinary: Negative for difficulty urinating and frequency.  Musculoskeletal: Negative for neck pain.  Skin: Negative for rash.  Allergic/Immunologic: Negative for environmental allergies and food allergies.  Neurological: Negative for weakness and headaches.  Psychiatric/Behavioral: Negative for agitation.  All other systems reviewed and are negative.      Objective:   Physical Exam  Constitutional: He appears well-developed and well-nourished.  HENT:  Head: Normocephalic and atraumatic.  Right Ear: External ear normal.  Left Ear: External ear normal.  Nose: Nose normal.  Mouth/Throat: Oropharynx is clear and moist.  Eyes: Right eye exhibits no discharge. Left eye exhibits no discharge. No scleral icterus.  Neck: Normal range of motion. Neck supple. No thyromegaly present.  Cardiovascular: Normal rate, regular rhythm and normal heart sounds.  No murmur heard. Pulmonary/Chest: Effort normal and breath sounds normal. No respiratory distress. He has no wheezes.  Abdominal: Soft. Bowel sounds are normal. He exhibits no distension and no mass. There is no tenderness.  Genitourinary: Penis normal.  Genitourinary Comments: Prostate gland mildly tender to palpation    Musculoskeletal: Normal range of motion. He exhibits no edema.  Lymphadenopathy:    He has no cervical adenopathy.  Neurological: He is alert. He exhibits normal muscle tone. Coordination normal.  Skin: Skin is warm and dry. No erythema.  Psychiatric: He has a normal mood and affect. His behavior is normal. Judgment normal.  Nursing note and vitals reviewed.         Assessment & Plan:  Last colon done 2017  Next one at least five yrs   Impression wellness exam.  See colonoscopy results.  Diet discussed.  Exercise discussed.  Patient has prescription for shingles vaccine to work on  2.  Potential prostatitis PSA has doubled in the past year.  Discussed with patient.  This is concerning.  Will treat with Cipro for 3 weeks and repeat PSA in 6 weeks  3.  Hypertension blood pressure initially good on repeat  4.  Type II plan follow-up in 6 months.  Cipro was ordered. Chronic medications.  Exercise

## 2017-07-18 NOTE — Patient Instructions (Signed)
Results for orders placed or performed in visit on 07/10/17  Hepatic function panel  Result Value Ref Range   Total Protein 6.6 6.0 - 8.5 g/dL   Albumin 4.4 3.6 - 4.8 g/dL   Bilirubin Total 0.4 0.0 - 1.2 mg/dL   Bilirubin, Direct 0.12 0.00 - 0.40 mg/dL   Alkaline Phosphatase 75 39 - 117 IU/L   AST 27 0 - 40 IU/L   ALT 56 (H) 0 - 44 IU/L  Lipid panel  Result Value Ref Range   Cholesterol, Total 184 100 - 199 mg/dL   Triglycerides 104 0 - 149 mg/dL   HDL 54 >39 mg/dL   VLDL Cholesterol Cal 21 5 - 40 mg/dL   LDL Calculated 109 (H) 0 - 99 mg/dL   Chol/HDL Ratio 3.4 0.0 - 5.0 ratio  Basic metabolic panel  Result Value Ref Range   Glucose 96 65 - 99 mg/dL   BUN 14 8 - 27 mg/dL   Creatinine, Ser 0.97 0.76 - 1.27 mg/dL   GFR calc non Af Amer 84 >59 mL/min/1.73   GFR calc Af Amer 97 >59 mL/min/1.73   BUN/Creatinine Ratio 14 10 - 24   Sodium 139 134 - 144 mmol/L   Potassium 4.3 3.5 - 5.2 mmol/L   Chloride 103 96 - 106 mmol/L   CO2 24 20 - 29 mmol/L   Calcium 9.0 8.6 - 10.2 mg/dL  Microalbumin / creatinine urine ratio  Result Value Ref Range   Creatinine, Urine 175.3 Not Estab. mg/dL   Microalbumin, Urine 22.6 Not Estab. ug/mL   Microalb/Creat Ratio 12.9 0.0 - 30.0 mg/g creat  PSA  Result Value Ref Range   Prostate Specific Ag, Serum 3.5 0.0 - 4.0 ng/mL

## 2017-09-06 DIAGNOSIS — N410 Acute prostatitis: Secondary | ICD-10-CM | POA: Diagnosis not present

## 2017-09-07 ENCOUNTER — Telehealth: Payer: Self-pay | Admitting: Family Medicine

## 2017-09-07 LAB — PSA: Prostate Specific Ag, Serum: 2.3 ng/mL (ref 0.0–4.0)

## 2017-09-07 NOTE — Telephone Encounter (Signed)
Review PSA results from [facility] in results folder.

## 2017-10-10 ENCOUNTER — Encounter: Payer: Self-pay | Admitting: Family Medicine

## 2017-10-10 ENCOUNTER — Ambulatory Visit: Payer: 59 | Admitting: Family Medicine

## 2017-10-10 VITALS — BP 128/72 | Ht 73.5 in | Wt 233.1 lb

## 2017-10-10 DIAGNOSIS — E0842 Diabetes mellitus due to underlying condition with diabetic polyneuropathy: Secondary | ICD-10-CM

## 2017-10-10 DIAGNOSIS — E119 Type 2 diabetes mellitus without complications: Secondary | ICD-10-CM | POA: Diagnosis not present

## 2017-10-10 DIAGNOSIS — I10 Essential (primary) hypertension: Secondary | ICD-10-CM

## 2017-10-10 DIAGNOSIS — N400 Enlarged prostate without lower urinary tract symptoms: Secondary | ICD-10-CM | POA: Diagnosis not present

## 2017-10-10 LAB — POCT GLYCOSYLATED HEMOGLOBIN (HGB A1C): Hemoglobin A1C: 4.6 % (ref 4.0–5.6)

## 2017-10-10 MED ORDER — LISINOPRIL 20 MG PO TABS: 20.0000 mg | ORAL_TABLET | Freq: Every day | ORAL | 5 refills | Status: DC

## 2017-10-10 MED ORDER — TAMSULOSIN HCL 0.4 MG PO CAPS: 0.8000 mg | ORAL_CAPSULE | Freq: Every day | ORAL | 5 refills | Status: DC

## 2017-10-10 MED ORDER — SILDENAFIL CITRATE 20 MG PO TABS
ORAL_TABLET | ORAL | 5 refills | Status: DC
Start: 2017-10-10 — End: 2018-04-25

## 2017-10-10 NOTE — Progress Notes (Signed)
   Subjective:    Patient ID: Thomas Summers, male    DOB: [birth year], 62 y.o.   MRN: [id number]  HPI Patient is here today to follow up on chronic illnesses. He does not eat heathy and gets some exercise. He does not see any specialists.  Blood pressure medicine and blood pressure levels reviewed today with patient. Compliant with blood pressure medicine. States does not miss a dose. No obvious side effects. Blood pressure generally good when checked elsewhere. Watching salt intake.   Patient claims compliance with diabetes medication. No obvious side effects. Reports no substantial low sugar spells. Most numbers are generally in good range when checked fasting. Generally does not miss a dose of medication. Watching diabetic diet closely  Last few numbers under 100  Still has low numvbers, at times the numbers get down into the 60s and 70s and patient become symptomatic.   Ongoing numbness in the feet, bilateral in nature.  Persists even with improvement of sugar levels.  flomax helps a bit, still gdts p twice per ngiht not helping that mcuh    Review of Systems No headache, no major weight loss or weight gain, no chest pain no back pain abdominal pain no change in bowel habits complete ROS otherwise negative     Results for orders placed or performed in visit on 10/10/17  POCT glycosylated hemoglobin (Hb A1C)  Result Value Ref Range   Hemoglobin A1C 4.6 4.0 - 5.6 %   HbA1c POC (<> result, manual entry)  4.0 - 5.6 %   HbA1c, POC (prediabetic range)  5.7 - 6.4 %   HbA1c, POC (controlled diabetic range)  0.0 - 7.0 %    Objective:   Physical Exam  Alert and oriented, vitals reviewed and stable, NAD ENT-TM's and ext canals WNL bilat via otoscopic exam Soft palate, tonsils and post pharynx WNL via oropharyngeal exam Neck-symmetric, no masses; thyroid nonpalpable and nontender Pulmonary-no tachypnea or accessory muscle use; Clear without wheezes via auscultation Card--no  abnrml murmurs, rhythm reg and rate WNL Carotid pulses symmetric, without bruits       Assessment & Plan:  1 hypertension.  Good control.  Discussed.  To maintain same meds.  Discussed1  2.  Type 2 diabetes.  A1c excellent.  Low sugars discussed.  Advised patient this generally does not become serious when not on any medications  3.  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  Prostate hypertrophy with increased urination ongoing challenges but stable per patient  Patient mentions several days of loose stools with no other symptoms symptom care discussed  Recheck in 6 months for chronic visit

## 2018-01-15 ENCOUNTER — Ambulatory Visit: Payer: 59 | Admitting: Family Medicine

## 2018-02-13 DIAGNOSIS — I10 Essential (primary) hypertension: Secondary | ICD-10-CM | POA: Diagnosis not present

## 2018-02-13 DIAGNOSIS — H35033 Hypertensive retinopathy, bilateral: Secondary | ICD-10-CM | POA: Diagnosis not present

## 2018-02-13 DIAGNOSIS — E119 Type 2 diabetes mellitus without complications: Secondary | ICD-10-CM | POA: Diagnosis not present

## 2018-02-13 LAB — HM DIABETES EYE EXAM

## 2018-02-26 ENCOUNTER — Encounter: Payer: Self-pay | Admitting: Family Medicine

## 2018-02-26 ENCOUNTER — Ambulatory Visit: Payer: 59 | Admitting: Family Medicine

## 2018-02-26 VITALS — BP 136/90 | Temp 98.1°F | Ht 73.5 in | Wt 238.0 lb

## 2018-02-26 DIAGNOSIS — J4520 Mild intermittent asthma, uncomplicated: Secondary | ICD-10-CM | POA: Diagnosis not present

## 2018-02-26 DIAGNOSIS — J310 Chronic rhinitis: Secondary | ICD-10-CM

## 2018-02-26 DIAGNOSIS — J329 Chronic sinusitis, unspecified: Secondary | ICD-10-CM | POA: Diagnosis not present

## 2018-02-26 MED ORDER — AMOXICILLIN-POT CLAVULANATE 875-125 MG PO TABS: ORAL_TABLET | ORAL | 0 refills | Status: DC

## 2018-02-26 MED ORDER — ALBUTEROL SULFATE HFA 108 (90 BASE) MCG/ACT IN AERS: 2.0000 | INHALATION_SPRAY | Freq: Four times a day (QID) | RESPIRATORY_TRACT | 2 refills | Status: DC | PRN

## 2018-02-26 NOTE — Progress Notes (Signed)
   Subjective:    Patient ID: Thomas Summers, male    DOB: 08/22/1955, 62 y.o.   MRN: [id number]  HPI  Patient is here today with complaints of a cough and chest congestion,drainage,wheezing,runny nose ongoing for the last three weeks. He has been taking zyrtec and mucinx and it is of no help.  Pt had cong and drange that kicked in.  Then kept going  Last wk or so got worse   Some productive cough  Felt wheezy and tight at times  Hears a whistling in the dhest no hx f inhaler use  No major h a , geenrally mild in nature. More nasal symtoms       Review of Systems No headache, no major weight loss or weight gain, no chest pain no back pain abdominal pain no change in bowel habits complete ROS otherwise negative     Objective:   Physical Exam  Alert, mild malaise. Hydration good Vitals stable. frontal/ maxillary tenderness evident positive nasal congestion. pharynx normal neck supple  lungs clear/no crackles or wheezes. heart regular in rhythm       Assessment & Plan:  Impression rhinosinusitis/bronchitis with element of reactive airways.  Albuterol prescribed and proper use discussed.  Likely post viral, discussed with patient. plan antibiotics prescribed. Questions answered. Symptomatic care discussed. warning signs discussed. WSL

## 2018-02-27 ENCOUNTER — Encounter: Payer: Self-pay | Admitting: *Deleted

## 2018-04-13 ENCOUNTER — Ambulatory Visit: Payer: 59 | Admitting: Family Medicine

## 2018-04-25 ENCOUNTER — Telehealth: Payer: Self-pay | Admitting: Family Medicine

## 2018-04-25 ENCOUNTER — Ambulatory Visit: Payer: 59 | Admitting: Family Medicine

## 2018-04-25 ENCOUNTER — Encounter: Payer: Self-pay | Admitting: Family Medicine

## 2018-04-25 VITALS — BP 130/84 | Ht 73.5 in | Wt 236.0 lb

## 2018-04-25 DIAGNOSIS — I10 Essential (primary) hypertension: Secondary | ICD-10-CM | POA: Diagnosis not present

## 2018-04-25 DIAGNOSIS — R300 Dysuria: Secondary | ICD-10-CM | POA: Diagnosis not present

## 2018-04-25 DIAGNOSIS — R5383 Other fatigue: Secondary | ICD-10-CM

## 2018-04-25 DIAGNOSIS — E119 Type 2 diabetes mellitus without complications: Secondary | ICD-10-CM | POA: Diagnosis not present

## 2018-04-25 DIAGNOSIS — Z23 Encounter for immunization: Secondary | ICD-10-CM

## 2018-04-25 DIAGNOSIS — Z125 Encounter for screening for malignant neoplasm of prostate: Secondary | ICD-10-CM

## 2018-04-25 DIAGNOSIS — N400 Enlarged prostate without lower urinary tract symptoms: Secondary | ICD-10-CM

## 2018-04-25 DIAGNOSIS — Z13220 Encounter for screening for lipoid disorders: Secondary | ICD-10-CM

## 2018-04-25 LAB — POCT URINALYSIS DIPSTICK
Spec Grav, UA: 1.025 (ref 1.010–1.025)
pH, UA: 6 (ref 5.0–8.0)

## 2018-04-25 MED ORDER — ALBUTEROL SULFATE HFA 108 (90 BASE) MCG/ACT IN AERS: 2.0000 | INHALATION_SPRAY | Freq: Four times a day (QID) | RESPIRATORY_TRACT | 2 refills | Status: DC | PRN

## 2018-04-25 MED ORDER — SILDENAFIL CITRATE 20 MG PO TABS
ORAL_TABLET | ORAL | 5 refills | Status: DC
Start: 2018-04-25 — End: 2018-10-24

## 2018-04-25 MED ORDER — LISINOPRIL 20 MG PO TABS: 20.0000 mg | ORAL_TABLET | Freq: Every day | ORAL | 5 refills | Status: DC

## 2018-04-25 MED ORDER — TAMSULOSIN HCL 0.4 MG PO CAPS: 0.8000 mg | ORAL_CAPSULE | Freq: Every day | ORAL | 5 refills | Status: DC

## 2018-04-25 NOTE — Telephone Encounter (Signed)
Plus notify the pt his urine test is fine, other than concentrated which increases the odor,  no infxn no white or red blood cells, all normal, incr water intake if pissiuble may help the concentration

## 2018-04-25 NOTE — Telephone Encounter (Signed)
Seen today and a1c and cbc ordered. Does he need any additional bw for physical

## 2018-04-25 NOTE — Telephone Encounter (Signed)
Lip liv met 7 psa urine pro

## 2018-04-25 NOTE — Telephone Encounter (Signed)
Pt has physical with Dr.Steve 05/08/18, requesting blood work.

## 2018-04-25 NOTE — Progress Notes (Signed)
   Subjective:    Patient ID: Thomas Summers, male    DOB: [birth year], 63 y.o.   MRN: [id number]  HPI  Patient is here today to follow up on his chronic health issues.  Sugar ok averagin over 100  Night time urination   Most sugrs low in the morn    Some odor in the ruine '  Pt is worried about odor, smells different    Exercise overall not good  Not going the best, has [employer]     He has a history of Diabetes he is not taking any medication he is trying to manage with diet and exercise. He is exercising three times per week.  He does not see any specialists.  History of Hypertension and takes Lisinopril 20 mg once per day.  Blood pressure medicine and blood pressure levels reviewed today with patient. Compliant with blood pressure medicine. States does not miss a dose. No obvious side effects. Blood pressure generally good when checked elsewhere. Watching salt intake.   Diet overall is not the b 104  Numbers overall good  Review of Systems No headache, no major weight loss or weight gain, no chest pain no back pain abdominal pain no change in bowel habits complete ROS otherwise negative     Objective:   Physical Exam Alert and oriented, vitals reviewed and stable, NAD ENT-TM's and ext canals WNL bilat via otoscopic exam Soft palate, tonsils and post pharynx WNL via oropharyngeal exam Neck-symmetric, no masses; thyroid nonpalpable and nontender Pulmonary-no tachypnea or accessory muscle use; Clear without wheezes via auscultation Card--no abnrml murmurs, rhythm reg and rate WNL Carotid pulses symmetric, without bruits  Urinalysis within normal limits no white blood cells no red blood cells no bacteria     Assessment & Plan:  Impression 1 type 2 diabetes.  Unable to do A1c here.  A1c done at lab.  See results.  Discussed with patient.  Patient to maintain same therapy  2.  Hypertension.  Good control discussed to maintain same meds  3.   Urinary symptomatology urinalysis within normal limits.  Does have an element of prostate hypertrophy longstanding.  Compliant with medications  Diet exercise discussed.  Vaccines discussed.  Patient agrees to pneumococcal vaccine will proceed/follow-up for wellness

## 2018-04-25 NOTE — Telephone Encounter (Signed)
Blood work ordered in [facility]. Patient also notified per Dr Bethel Born urine test is fine, other than concentrated which increases the odor,  no infxn no white or red blood cells, all normal, increase water intake if possible may help the concentration. Patient verbalized understanding.

## 2018-04-26 DIAGNOSIS — I10 Essential (primary) hypertension: Secondary | ICD-10-CM | POA: Diagnosis not present

## 2018-04-26 DIAGNOSIS — E119 Type 2 diabetes mellitus without complications: Secondary | ICD-10-CM | POA: Diagnosis not present

## 2018-04-26 DIAGNOSIS — Z13220 Encounter for screening for lipoid disorders: Secondary | ICD-10-CM | POA: Diagnosis not present

## 2018-04-26 LAB — CBC WITH DIFFERENTIAL/PLATELET
Basophils Absolute: 0.1 10*3/uL (ref 0.0–0.2)
Basos: 1 %
EOS (ABSOLUTE): 0.3 10*3/uL (ref 0.0–0.4)
Eos: 3 %
Hematocrit: 47.6 % (ref 37.5–51.0)
Hemoglobin: 16.3 g/dL (ref 13.0–17.7)
Immature Grans (Abs): 0.1 10*3/uL (ref 0.0–0.1)
Immature Granulocytes: 1 %
Lymphocytes Absolute: 1.9 10*3/uL (ref 0.7–3.1)
Lymphs: 21 %
MCH: 30.6 pg (ref 26.6–33.0)
MCHC: 34.2 g/dL (ref 31.5–35.7)
MCV: 90 fL (ref 79–97)
Monocytes Absolute: 0.9 10*3/uL (ref 0.1–0.9)
Monocytes: 10 %
Neutrophils Absolute: 6 10*3/uL (ref 1.4–7.0)
Neutrophils: 64 %
Platelets: 250 10*3/uL (ref 150–450)
RBC: 5.32 x10E6/uL (ref 4.14–5.80)
RDW: 14.4 % (ref 11.6–15.4)
WBC: 9.4 10*3/uL (ref 3.4–10.8)

## 2018-04-26 LAB — HEMOGLOBIN A1C
Est. average glucose Bld gHb Est-mCnc: 134 mg/dL
Hgb A1c MFr Bld: 6.3 % — ABNORMAL HIGH (ref 4.8–5.6)

## 2018-04-27 LAB — HEPATIC FUNCTION PANEL
ALT: 98 IU/L — ABNORMAL HIGH (ref 0–44)
AST: 38 IU/L (ref 0–40)
Albumin: 4.3 g/dL (ref 3.8–4.8)
Alkaline Phosphatase: 80 IU/L (ref 39–117)
Bilirubin Total: 0.6 mg/dL (ref 0.0–1.2)
Bilirubin, Direct: 0.23 mg/dL (ref 0.00–0.40)
Total Protein: 6.9 g/dL (ref 6.0–8.5)

## 2018-04-27 LAB — LIPID PANEL
Chol/HDL Ratio: 3.7 ratio (ref 0.0–5.0)
Cholesterol, Total: 189 mg/dL (ref 100–199)
HDL: 51 mg/dL (ref >39)
LDL Calculated: 119 mg/dL — ABNORMAL HIGH (ref 0–99)
Triglycerides: 93 mg/dL (ref 0–149)
VLDL Cholesterol Cal: 19 mg/dL (ref 5–40)

## 2018-04-27 LAB — BASIC METABOLIC PANEL
BUN / CREAT RATIO: 16 (ref 10–24)
BUN: 15 mg/dL (ref 8–27)
CO2: 24 mmol/L (ref 20–29)
Calcium: 9 mg/dL (ref 8.6–10.2)
Chloride: 101 mmol/L (ref 96–106)
Creatinine, Ser: 0.92 mg/dL (ref 0.76–1.27)
GFR calc Af Amer: 103 mL/min/{1.73_m2} (ref >59)
GFR calc non Af Amer: 89 mL/min/{1.73_m2} (ref >59)
Glucose: 98 mg/dL (ref 65–99)
Potassium: 4.3 mmol/L (ref 3.5–5.2)
Sodium: 142 mmol/L (ref 134–144)

## 2018-04-27 LAB — MICROALBUMIN / CREATININE URINE RATIO
Creatinine, Urine: 207 mg/dL
Microalb/Creat Ratio: 12 mg/g creat (ref 0–29)
Microalbumin, Urine: 24.7 ug/mL

## 2018-04-27 LAB — PSA: Prostate Specific Ag, Serum: 1.8 ng/mL (ref 0.0–4.0)

## 2018-05-08 ENCOUNTER — Encounter: Payer: Self-pay | Admitting: Family Medicine

## 2018-05-08 ENCOUNTER — Ambulatory Visit (INDEPENDENT_AMBULATORY_CARE_PROVIDER_SITE_OTHER): Payer: 59 | Admitting: Family Medicine

## 2018-05-08 VITALS — BP 134/78 | Ht 73.5 in | Wt 236.0 lb

## 2018-05-08 DIAGNOSIS — Z0000 Encounter for general adult medical examination without abnormal findings: Secondary | ICD-10-CM | POA: Diagnosis not present

## 2018-05-08 MED ORDER — ZOSTER VAC RECOMB ADJUVANTED 50 MCG/0.5ML IM SUSR: 0.5000 mL | Freq: Once | INTRAMUSCULAR | 1 refills | Status: AC

## 2018-05-08 NOTE — Progress Notes (Signed)
Subjective:    Patient ID: Thomas Summers, male    DOB: [birth year], 63 y.o.   MRN: [id number]  HPI The patient comes in today for a wellness visit.    A review of their health history was completed.  A review of medications was also completed.  Any needed refills; none at this tim  Eating habits: pt states he has bad eating habits  Falls/  MVA accidents in past few months: none  Regular exercise: walking 3 times a week for 20 minutes   Exercise reg, spouse ne pt tries to alk reg, doing reg activities  Getting into [employer]  Diet not so good in fact terrible   Specialist pt sees on regular basis: none  Preventative health issues were discussed.   Additional concerns: blister on left middle finger. Been on finger about a month. Not painful.  Results for orders placed or performed in visit on 04/25/18  Lipid panel  Result Value Ref Range   Cholesterol, Total 189 100 - 199 mg/dL   Triglycerides 93 0 - 149 mg/dL   HDL 51 >39 mg/dL   VLDL Cholesterol Cal 19 5 - 40 mg/dL   LDL Calculated 119 (H) 0 - 99 mg/dL   Chol/HDL Ratio 3.7 0.0 - 5.0 ratio  Hepatic function panel  Result Value Ref Range   Total Protein 6.9 6.0 - 8.5 g/dL   Albumin 4.3 3.8 - 4.8 g/dL   Bilirubin Total 0.6 0.0 - 1.2 mg/dL   Bilirubin, Direct 0.23 0.00 - 0.40 mg/dL   Alkaline Phosphatase 80 39 - 117 IU/L   AST 38 0 - 40 IU/L   ALT 98 (H) 0 - 44 IU/L  Basic metabolic panel  Result Value Ref Range   Glucose 98 65 - 99 mg/dL   BUN 15 8 - 27 mg/dL   Creatinine, Ser 0.92 0.76 - 1.27 mg/dL   GFR calc non Af Amer 89 >59 mL/min/1.73   GFR calc Af Amer 103 >59 mL/min/1.73   BUN/Creatinine Ratio 16 10 - 24   Sodium 142 134 - 144 mmol/L   Potassium 4.3 3.5 - 5.2 mmol/L   Chloride 101 96 - 106 mmol/L   CO2 24 20 - 29 mmol/L   Calcium 9.0 8.6 - 10.2 mg/dL  PSA  Result Value Ref Range   Prostate Specific Ag, Serum 1.8 0.0 - 4.0 ng/mL  Microalbumin / creatinine urine ratio  Result Value Ref  Range   Creatinine, Urine 207.0 Not Estab. mg/dL   Microalbumin, Urine 24.7 Not Estab. ug/mL   Microalb/Creat Ratio 12 0 - 29 mg/g creat  Review of Systems  Constitutional: Negative for activity change, appetite change and fever.  HENT: Negative for congestion and rhinorrhea.   Eyes: Negative for discharge.  Respiratory: Negative for cough and wheezing.   Cardiovascular: Negative for chest pain.  Gastrointestinal: Negative for abdominal pain, blood in stool and vomiting.  Genitourinary: Negative for difficulty urinating and frequency.  Musculoskeletal: Negative for neck pain.  Skin: Negative for rash.  Allergic/Immunologic: Negative for environmental allergies and food allergies.  Neurological: Negative for weakness and headaches.  Psychiatric/Behavioral: Negative for agitation.  All other systems reviewed and are negative.      Objective:   Physical Exam Constitutional:      Appearance: He is well-developed.  HENT:     Head: Normocephalic and atraumatic.     Right Ear: External ear normal.     Left Ear: External ear normal.     Nose: Nose normal.  Eyes:     Pupils: Pupils are equal, round, and reactive to light.  Neck:     Musculoskeletal: Normal range of motion and neck supple.     Thyroid: No thyromegaly.  Cardiovascular:     Rate and Rhythm: Normal rate and regular rhythm.     Heart sounds: Normal heart sounds. No murmur.  Pulmonary:     Effort: Pulmonary effort is normal. No respiratory distress.     Breath sounds: Normal breath sounds. No wheezing.  Abdominal:     General:  Bowel sounds are normal. There is no distension.     Palpations: Abdomen is soft. There is no mass.     Tenderness: There is no abdominal tenderness.  Genitourinary:    Penis: Normal.      Prostate: Normal.  Musculoskeletal: Normal range of motion.  Lymphadenopathy:     Cervical: No cervical adenopathy.  Skin:    General: Skin is warm and dry.     Findings: No erythema.  Neurological:     Mental Status: He is alert.     Motor: No abnormal muscle tone.  Psychiatric:        Behavior: Behavior normal.        Judgment: Judgment normal.    Tiny cyst mid distal finger       Assessment & Plan:  Impression wellness exam.  Diet discussed.  Exercise discussed and strongly encouraged.  Colon done in 17 next due in 22 due to polps.  Vaccines discussed and administered were appropriate patient has a tiny cyst on the distal finger benign in nature.  Discussed.  Patient follow-up in 6 months for chronic health concerns/shingles vaccine.

## 2018-05-25 ENCOUNTER — Other Ambulatory Visit: Payer: Self-pay | Admitting: Family Medicine

## 2018-05-28 ENCOUNTER — Encounter: Payer: Self-pay | Admitting: Family Medicine

## 2018-05-28 ENCOUNTER — Ambulatory Visit: Payer: 59 | Admitting: Family Medicine

## 2018-05-28 VITALS — BP 152/92 | Ht 73.5 in | Wt 236.0 lb

## 2018-05-28 DIAGNOSIS — R21 Rash and other nonspecific skin eruption: Secondary | ICD-10-CM

## 2018-05-28 NOTE — Progress Notes (Signed)
   Subjective:    Patient ID: Thomas Summers, male    DOB: 04-16-1955, 63 y.o.   MRN: [id number]  HPI  Patient is here today with complaints of a lump on his left side.  He notice this on Friday.   It is not painful..  Also has a red mark on his left arm.This is not itchy or painful either.  Patient arrives with 2 distinct skin concerns.  One on his left arm.  Annular in appearance.  Progressive over the last week.  Very slightly pruritic  Left lateral hip.  Has developed a lump.  Concerned about this.  Nontender.  Review of Systems No headache, no major weight loss or weight gain, no chest pain no back pain abdominal pain no change in bowel habits complete ROS otherwise negative     Objective:   Physical Exam  Alert vitals stable, NAD. Blood pressure good on repeat. HEENT normal. Lungs clear. Heart regular rate and rhythm. Left arm annular circular rash with bleeding meds petechial in nature central papule left hip cyst palpable with some surrounding blackheads in this region at his belt      Assessment & Plan:  Impression 2 benign-appearing lesions left arm appears like a bite with some secondary rash hydrocortisone encourage the others assist benign in nature will offer dermatology excision if persists discussed

## 2018-07-02 ENCOUNTER — Telehealth: Payer: Self-pay | Admitting: Family Medicine

## 2018-07-02 MED ORDER — GENTAMICIN SULFATE 0.3 % OP SOLN: 2.0000 [drp] | Freq: Four times a day (QID) | OPHTHALMIC | 0 refills | Status: DC

## 2018-07-02 NOTE — Telephone Encounter (Signed)
Garamycin 2 drops qid affected eye

## 2018-07-02 NOTE — Telephone Encounter (Signed)
Pt's right eye is irritated. He was outside a lot last week and thought it was allergies but states it has swollen and is hurting. No drainage. Complaining of upper eye lid hurting and tender to touch, he has been using moisturizer drops and heat compress.   CB# [phone]

## 2018-07-02 NOTE — Telephone Encounter (Signed)
Prescription sent electronically to pharmacy. Patient notified. 

## 2018-07-04 ENCOUNTER — Other Ambulatory Visit: Payer: Self-pay

## 2018-07-04 ENCOUNTER — Ambulatory Visit (INDEPENDENT_AMBULATORY_CARE_PROVIDER_SITE_OTHER): Payer: 59 | Admitting: Family Medicine

## 2018-07-04 ENCOUNTER — Encounter: Payer: Self-pay | Admitting: Family Medicine

## 2018-07-04 VITALS — Wt 230.0 lb

## 2018-07-04 DIAGNOSIS — H00039 Abscess of eyelid unspecified eye, unspecified eyelid: Secondary | ICD-10-CM

## 2018-07-04 MED ORDER — DOXYCYCLINE HYCLATE 100 MG PO TABS: ORAL_TABLET | ORAL | 0 refills | Status: DC

## 2018-07-04 NOTE — Progress Notes (Signed)
   Subjective:    Patient ID: Thomas Summers, male    DOB: 04/21/1955, 63 y.o.   MRN: [id number]  Eye Pain   The right eye is affected. This is a new problem. Episode onset: started over a week ago, worked outside in the yard. Injury mechanism: worked out in the yard this past weekend  Associated symptoms comments: Eyelid is swollen, last Saturday became worse. Pt did call Monday and we sent in medication. There are some clear filled bumps on upper eye. Eye itches. Yesterday eye did feel better, but today eye has become itchy. Mornings eye is kind of stuck together. He has tried eye drops (heat compresses) for the symptoms. The treatment provided no relief.   Virtual Visit via Telephone Note  I connected with Thomas Summers on 07/04/18 at  4:00 PM EDT by telephone and verified that I am speaking with the correct person using two identifiers.   I discussed the limitations, risks, security and privacy concerns of performing an evaluation and management service by telephone and the availability of in person appointments. I also discussed with the patient that there may be a patient responsible charge related to this service. The patient expressed understanding and agreed to proceed. Outside a lot  Eye got irritated swollen  Also tender  Called in abx   Has been using painful  Mump on upper eyelid pea sized and tend  Since yest   History of Present Illness:    Observations/Objective:   Assessment and Plan:   Follow Up Instructions:    I discussed the assessment and treatment plan with the patient. The patient was provided an opportunity to ask questions and all were answered. The patient agreed with the plan and demonstrated an understanding of the instructions.   The patient was advised to call back or seek an in-person evaluation if the symptoms worsen or if the condition fails to improve as anticipated.  I provided  minutes of non-face-to-face time during this  encounter.   Vicente Males, LPN    Review of Systems  Eyes: Positive for pain.       Objective:   Physical Exam  No exam secondary to remote virtual visit      Assessment & Plan:  Impression probable stye patient went at very great length to describe inflamed swollen upper eyelid with a central tender palpable nodule minimal erythema eyeball no trouble with vision plan oral antibiotics since antibiotic drops have been insufficient see prior phone notes  Greater than 50% of this 15 minute face to face visit was spent in counseling and discussion and coordination of care regarding the above diagnosis/diagnosies   Rotavirus questions discussed

## 2018-07-17 ENCOUNTER — Telehealth: Payer: Self-pay | Admitting: Family Medicine

## 2018-07-17 NOTE — Telephone Encounter (Signed)
Pt was seen 07/04/2018 for eye irritation the antibiotic helped but pt finished in on Saturday and his eye is back to being irritated. Offered the pt a virtual visit this afternoon with Dr. Richardson Landry for a recheck but he states his ins is not covering those and he would like a note to go back first and if a visit is needed he will do one.

## 2018-07-17 NOTE — Telephone Encounter (Signed)
Patient advised per Dr Richardson Landry: Likely residual stye which often leaves a remnant bump which can have inflammatory itchy changes on the surface, no further antibiotics.Marland Kitchen topical otc hydrocortisone 1 % bid to skin of eyelid will help itchiness and swelling, bump could persist for months, rec eye doc visit once they open back up if persists. Patient verbalized understanding.

## 2018-07-17 NOTE — Telephone Encounter (Signed)
Likely residual stye which often leaves a remnant bump which can have inflammatoruy itchy changes on the surface, no further antibiotics.Marland Kitchen topical otc hydrocortisone 1 per cent bid to skin of eyelid will help itchiness and swelling, bumbp could persist for months, rec eye doc visit once they open back up if persists

## 2018-07-17 NOTE — Telephone Encounter (Signed)
ntsw more, pt is enmormously anxious about everything if we do no0t have his complete description this will go back and forth many times

## 2018-07-17 NOTE — Telephone Encounter (Signed)
Right eye irritated. No drainage. Bump on upper eyelid where eyelashes are. Red and itchy. Same thing as a few weeks ago. Eye drops didn't work. Doxycyline was prescribed and did work; pt took complete course. Inflammation went down, small bump above lash. When it began to get better, the size went down to about pea size. The bump scabbed over as it was getting smaller. No other symptoms. Pt has hooded eyes; not sure if that is related to this. Started when patient began working in the yard a lot. Vision is normal. Please advise. Thank you.

## 2018-08-29 ENCOUNTER — Other Ambulatory Visit: Payer: Self-pay | Admitting: Dermatology

## 2018-10-23 ENCOUNTER — Other Ambulatory Visit: Payer: Self-pay

## 2018-10-24 ENCOUNTER — Encounter: Payer: Self-pay | Admitting: Family Medicine

## 2018-10-24 ENCOUNTER — Other Ambulatory Visit: Payer: Self-pay

## 2018-10-24 ENCOUNTER — Ambulatory Visit (INDEPENDENT_AMBULATORY_CARE_PROVIDER_SITE_OTHER): Payer: 59 | Admitting: Family Medicine

## 2018-10-24 DIAGNOSIS — I10 Essential (primary) hypertension: Secondary | ICD-10-CM

## 2018-10-24 DIAGNOSIS — E119 Type 2 diabetes mellitus without complications: Secondary | ICD-10-CM

## 2018-10-24 DIAGNOSIS — E0842 Diabetes mellitus due to underlying condition with diabetic polyneuropathy: Secondary | ICD-10-CM | POA: Diagnosis not present

## 2018-10-24 MED ORDER — SILDENAFIL CITRATE 20 MG PO TABS: ORAL_TABLET | ORAL | 5 refills | Status: DC

## 2018-10-24 MED ORDER — LISINOPRIL 20 MG PO TABS: 20.0000 mg | ORAL_TABLET | Freq: Every day | ORAL | 5 refills | Status: DC

## 2018-10-24 NOTE — Progress Notes (Signed)
   Subjective:    Patient ID: Thomas Summers, male    DOB: 1955/11/04, 63 y.o.   MRN: [id number] Audio only  Patient presents with multiple concerns Diabetes He presents for his follow-up diabetic visit. He has type 2 diabetes mellitus. Current diabetic treatment includes diet. Home blood sugar record trend: fasting around 105.  blood sugar drops too low about one to two times per week.   Concerns about tingling in toes. Patient notes his chronic bilateral diabetic neuropathy still persists.  Tingling at times.  Numb at times.  Worse in the evening  Pt checked bp today and it was 129/84.  Virtual Visit via Telephone Note  I connected with Thomas Summers on 10/24/18 at  2:00 PM EDT by telephone and verified that I am speaking with the correct person using two identifiers.  Location: Patient: home Provider: office   I discussed the limitations, risks, security and privacy concerns of performing an evaluation and management service by telephone and the availability of in person appointments. I also discussed with the patient that there may be a patient responsible charge related to this service. The patient expressed understanding and agreed to proceed.  Patient claims compliance with diabetes medication. No obvious side effects. Reports no substantial low sugar spells. Most numbers are generally in good range when checked fasting. Generally does not miss a dose of medication. Watching diabetic diet closely  Blood pressure medicine and blood pressure levels reviewed today with patient. Compliant with blood pressure medicine. States does not miss a dose. No obvious side effects. Blood pressure generally good when checked elsewhere. Watching salt intake.     History of Present Illness:    Observations/Objective:   Assessment and Plan:   Follow Up Instructions:    I discussed the assessment and treatment plan with the patient. The patient was provided an opportunity to  ask questions and all were answered. The patient agreed with the plan and demonstrated an understanding of the instructions.   The patient was advised to call back or seek an in-person evaluation if the symptoms worsen or if the condition fails to improve as anticipated.  I provided 25 minutes of non-face-to-face time during this encounter.    Review of Systems No headache, no major weight loss or weight gain, no chest pain no back pain abdominal pain no change in bowel habits complete ROS otherwise negative     Objective:   Physical Exam   Virtual     Assessment & Plan:  Impression 1 type 2 diabetes.  Overall still good control.  Most sugars in the low 100s which is at goal.  In addition patient perceives low sugar spells which may be exacerbated by initiation of medication.  For these reasons we will maintain the same dose  2.  Diabetic neuropathy discuss ongoing  3.  Hypertension.  Blood pressure is good discussed to maintain the same dose  Medications refilled diet exercise discussed.  Hold off on blood work follow-up in 6 months for wellness plus chronic

## 2018-11-14 ENCOUNTER — Other Ambulatory Visit: Payer: Self-pay | Admitting: Family Medicine

## 2019-04-29 ENCOUNTER — Other Ambulatory Visit: Payer: Self-pay | Admitting: Family Medicine

## 2019-04-29 ENCOUNTER — Telehealth: Payer: Self-pay | Admitting: Family Medicine

## 2019-04-29 DIAGNOSIS — Z79899 Other long term (current) drug therapy: Secondary | ICD-10-CM

## 2019-04-29 DIAGNOSIS — I10 Essential (primary) hypertension: Secondary | ICD-10-CM

## 2019-04-29 DIAGNOSIS — E119 Type 2 diabetes mellitus without complications: Secondary | ICD-10-CM

## 2019-04-29 DIAGNOSIS — Z125 Encounter for screening for malignant neoplasm of prostate: Secondary | ICD-10-CM

## 2019-04-29 DIAGNOSIS — Z13220 Encounter for screening for lipoid disorders: Secondary | ICD-10-CM

## 2019-04-29 NOTE — Telephone Encounter (Signed)
Pt has CPE scheduled 2/8 and would like lab work done.

## 2019-04-29 NOTE — Telephone Encounter (Signed)
Pt may be seen in office for 6 month followup

## 2019-04-29 NOTE — Telephone Encounter (Signed)
Lab orders placed; left message to return call  

## 2019-04-29 NOTE — Telephone Encounter (Signed)
Patient had virtual  Visit back in July for his diabetic follow up. He is requesting to be seen for his six month follow up for diabetes to check his feet. Please advise

## 2019-04-29 NOTE — Telephone Encounter (Signed)
ok 

## 2019-04-29 NOTE — Telephone Encounter (Signed)
Last labs completed on 04/26/2018 urine micro, psa, bmet, hepatic and lipid. Please advise. Thank you.

## 2019-04-29 NOTE — Telephone Encounter (Signed)
Pt would like to be seen in office for 6 month follow up. Please advise. Thank you

## 2019-04-29 NOTE — Telephone Encounter (Signed)
Yes repeat PLUS hgb A1c

## 2019-05-01 NOTE — Telephone Encounter (Signed)
Pt contacted and verbalized understanding.  

## 2019-05-09 ENCOUNTER — Encounter: Payer: Self-pay | Admitting: Family Medicine

## 2019-05-09 LAB — LIPID PANEL
Chol/HDL Ratio: 3.7 ratio (ref 0.0–5.0)
Cholesterol, Total: 198 mg/dL (ref 100–199)
HDL: 54 mg/dL (ref >39)
LDL Chol Calc (NIH): 126 mg/dL — ABNORMAL HIGH (ref 0–99)
Triglycerides: 101 mg/dL (ref 0–149)
VLDL Cholesterol Cal: 18 mg/dL (ref 5–40)

## 2019-05-09 LAB — BASIC METABOLIC PANEL
BUN/Creatinine Ratio: 14 (ref 10–24)
BUN: 13 mg/dL (ref 8–27)
CO2: 22 mmol/L (ref 20–29)
Calcium: 9.1 mg/dL (ref 8.6–10.2)
Chloride: 105 mmol/L (ref 96–106)
Creatinine, Ser: 0.91 mg/dL (ref 0.76–1.27)
GFR calc Af Amer: 103 mL/min/{1.73_m2} (ref >59)
GFR calc non Af Amer: 89 mL/min/{1.73_m2} (ref >59)
Glucose: 103 mg/dL — ABNORMAL HIGH (ref 65–99)
Potassium: 4.2 mmol/L (ref 3.5–5.2)
Sodium: 143 mmol/L (ref 134–144)

## 2019-05-09 LAB — HEPATIC FUNCTION PANEL
ALT: 97 IU/L — ABNORMAL HIGH (ref 0–44)
AST: 39 IU/L (ref 0–40)
Albumin: 4.4 g/dL (ref 3.8–4.8)
Alkaline Phosphatase: 98 IU/L (ref 39–117)
Bilirubin Total: 0.5 mg/dL (ref 0.0–1.2)
Bilirubin, Direct: 0.13 mg/dL (ref 0.00–0.40)
Total Protein: 7 g/dL (ref 6.0–8.5)

## 2019-05-09 LAB — MICROALBUMIN / CREATININE URINE RATIO
Creatinine, Urine: 187 mg/dL
Microalb/Creat Ratio: 12 mg/g creat (ref 0–29)
Microalbumin, Urine: 23.2 ug/mL

## 2019-05-09 LAB — HEMOGLOBIN A1C
Est. average glucose Bld gHb Est-mCnc: 143 mg/dL
Hgb A1c MFr Bld: 6.6 % — ABNORMAL HIGH (ref 4.8–5.6)

## 2019-05-09 LAB — PSA: Prostate Specific Ag, Serum: 2.4 ng/mL (ref 0.0–4.0)

## 2019-05-13 ENCOUNTER — Encounter: Payer: 59 | Admitting: Family Medicine

## 2019-05-13 ENCOUNTER — Encounter: Payer: Self-pay | Admitting: Family Medicine

## 2019-05-13 ENCOUNTER — Other Ambulatory Visit: Payer: Self-pay

## 2019-05-13 ENCOUNTER — Ambulatory Visit (INDEPENDENT_AMBULATORY_CARE_PROVIDER_SITE_OTHER): Payer: 59 | Admitting: Family Medicine

## 2019-05-13 VITALS — BP 134/82 | Temp 97.3°F | Ht 73.5 in | Wt 235.8 lb

## 2019-05-13 DIAGNOSIS — Z0000 Encounter for general adult medical examination without abnormal findings: Secondary | ICD-10-CM | POA: Diagnosis not present

## 2019-05-13 MED ORDER — ATORVASTATIN CALCIUM 20 MG PO TABS: ORAL_TABLET | ORAL | 5 refills | Status: DC

## 2019-05-13 MED ORDER — TAMSULOSIN HCL 0.4 MG PO CAPS: 0.8000 mg | ORAL_CAPSULE | Freq: Every day | ORAL | 5 refills | Status: DC

## 2019-05-13 MED ORDER — SHINGRIX 50 MCG/0.5ML IM SUSR: 0.5000 mL | Freq: Once | INTRAMUSCULAR | 1 refills | Status: AC

## 2019-05-13 MED ORDER — LISINOPRIL 20 MG PO TABS: 20.0000 mg | ORAL_TABLET | Freq: Every day | ORAL | 5 refills | Status: DC

## 2019-05-13 NOTE — Progress Notes (Signed)
Subjective:    Patient ID: Thomas Summers, male    DOB: [birth year], 64 y.o.   MRN: [id number]  Diabetes He presents for his follow-up diabetic visit. He has type 2 diabetes mellitus. There are no hypoglycemic associated symptoms. Pertinent negatives for hypoglycemia include no headaches. There are no diabetic associated symptoms. Pertinent negatives for diabetes include no chest pain and no weakness. There are no hypoglycemic complications. There are no diabetic complications. Risk factors for coronary artery disease include hypertension. He is compliant with treatment all of the time. He is following a generally unhealthy diet. He has not had a previous visit with a dietitian. He rarely participates in exercise. His breakfast blood glucose range is generally 110-130 mg/dl. He does not see a podiatrist.Eye exam is current.   The patient comes in today for a wellness visit.  Results for orders placed or performed in visit on 04/29/19  Hemoglobin A1c  Result Value Ref Range   Hgb A1c MFr Bld 6.6 (H) 4.8 - 5.6 %   Est. average glucose Bld gHb Est-mCnc 143 mg/dL  Urine Microalbumin w/creat. ratio  Result Value Ref Range   Creatinine, Urine 187.0 Not Estab. mg/dL   Microalbumin, Urine 23.2 Not Estab. ug/mL   Microalb/Creat Ratio 12 0 - 29 mg/g creat  PSA  Result Value Ref Range   Prostate Specific Ag, Serum 2.4 0.0 - 4.0 ng/mL  Basic Metabolic Panel (BMET)  Result Value Ref Range   Glucose 103 (H) 65 - 99 mg/dL   BUN 13 8 - 27 mg/dL   Creatinine, Ser 0.91 0.76 - 1.27 mg/dL   GFR calc non Af Amer 89 >59 mL/min/1.73   GFR calc Af Amer 103 >59 mL/min/1.73   BUN/Creatinine Ratio 14 10 - 24   Sodium 143 134 - 144 mmol/L   Potassium 4.2 3.5 - 5.2 mmol/L   Chloride 105 96 - 106 mmol/L   CO2 22 20 - 29 mmol/L   Calcium 9.1 8.6 - 10.2 mg/dL  Hepatic function panel  Result Value Ref Range   Total Protein 7.0 6.0 - 8.5 g/dL   Albumin 4.4 3.8 - 4.8 g/dL   Bilirubin Total 0.5 0.0 - 1.2  mg/dL   Bilirubin, Direct 0.13 0.00 - 0.40 mg/dL   Alkaline Phosphatase 98 39 - 117 IU/L   AST 39 0 - 40 IU/L   ALT 97 (H) 0 - 44 IU/L  Lipid Profile  Result Value Ref Range   Cholesterol, Total 198 100 - 199 mg/dL   Triglycerides 101 0 - 149 mg/dL   HDL 54 >39 mg/dL   VLDL Cholesterol Cal 18 5 - 40 mg/dL   LDL Chol Calc (NIH) 126 (H) 0 - 99 mg/dL   Chol/HDL Ratio 3.7 0.0 - 5.0 ratio     A review of their health history was completed.  A review of medications was also completed.  Any needed refills; lisinopril   Eating habits: not as good as they can be  Falls/  MVA accidents in past few months: none  Regular exercise: no  Specialist pt sees on regular basis: none  Preventative health issues were discussed.   Additional concerns:    Review of Systems  Constitutional: Negative for activity change, appetite change and fever.  HENT: Negative for congestion and rhinorrhea.   Eyes: Negative for discharge.  Respiratory: Negative for cough and wheezing.   Cardiovascular: Negative for chest pain.  Gastrointestinal: Negative for abdominal pain, blood in stool and vomiting.  Genitourinary:  Negative for difficulty urinating and frequency.  Musculoskeletal: Negative for neck pain.  Skin: Negative for rash.  Allergic/Immunologic: Negative for environmental allergies and food allergies.  Neurological: Negative for weakness and headaches.  Psychiatric/Behavioral: Negative for agitation.  All other systems reviewed and are negative.      Objective:   Physical Exam Vitals reviewed.  Constitutional:      Appearance: He is well-developed.  HENT:     Head: Normocephalic and atraumatic.     Right Ear: External ear normal.     Left Ear: External ear normal.     Nose: Nose normal.  Eyes:     Pupils: Pupils are equal, round, and reactive to light.  Neck:     Thyroid: No thyromegaly.  Cardiovascular:     Rate and Rhythm: Normal rate and regular rhythm.     Heart sounds:  Normal heart sounds. No murmur.  Pulmonary:     Effort: Pulmonary effort is normal. No respiratory distress.     Breath sounds: Normal breath sounds. No wheezing.  Abdominal:     General: Bowel sounds are normal. There is no distension.     Palpations: Abdomen is soft. There is no mass.     Tenderness: There is no abdominal tenderness.  Genitourinary:    Penis: Normal.   Musculoskeletal:        General: Normal range of motion.     Cervical back: Normal range of motion and neck supple.  Lymphadenopathy:     Cervical: No cervical adenopathy.  Skin:    General: Skin is warm and dry.     Findings: No erythema.  Neurological:     Mental Status: He is alert.     Motor: No abnormal muscle tone.  Psychiatric:        Behavior: Behavior normal.        Judgment: Judgment normal.      Feet pulses intact bilateral/sensation intact     Assessment & Plan:  Impression 1 wellness exam.  Diet discussed exercise discussed.  Last colonoscopy 4 years ago next 1 due in a year and a half.  Blood work reviewed.  2.  Type 2 diabetes good control discussed to maintain same approach  3.  Hypertension blood pressure good on repeat  4.  Hyperlipidemia.  Conversation held.  Will initiate Lipitor 20 mg daily.  Also initiate one baby aspirin daily rationale discussed.  Follow-up in 6 months diet exercise discussed

## 2019-11-18 ENCOUNTER — Telehealth: Payer: Self-pay | Admitting: Family Medicine

## 2019-11-18 ENCOUNTER — Other Ambulatory Visit: Payer: Self-pay | Admitting: *Deleted

## 2019-11-18 MED ORDER — ATORVASTATIN CALCIUM 20 MG PO TABS: ORAL_TABLET | ORAL | 0 refills | Status: DC

## 2019-11-18 MED ORDER — LISINOPRIL 20 MG PO TABS: 20.0000 mg | ORAL_TABLET | Freq: Every day | ORAL | 0 refills | Status: DC

## 2019-11-18 NOTE — Telephone Encounter (Signed)
Pt also asked for lisinopril. 30 day on both meds sent to pharm per protocol. Pt was notified.

## 2019-11-18 NOTE — Telephone Encounter (Signed)
Has an appt with Dr. Lovena Le Sept 8th but just ran out of one of his meds and needs a refill until appt.  He said the pharmacy has been sending a request but I don't see anything in his chart.   atorvastatin (LIPITOR) 20 MG tablet    [facility]

## 2019-11-28 ENCOUNTER — Other Ambulatory Visit: Payer: Self-pay | Admitting: *Deleted

## 2019-11-28 MED ORDER — LISINOPRIL 20 MG PO TABS
20.0000 mg | ORAL_TABLET | Freq: Every day | ORAL | 0 refills | Status: DC
Start: 2019-11-28 — End: 2019-12-13

## 2019-12-11 ENCOUNTER — Ambulatory Visit: Payer: 59 | Admitting: Family Medicine

## 2019-12-13 ENCOUNTER — Ambulatory Visit: Payer: 59 | Admitting: Family Medicine

## 2019-12-13 ENCOUNTER — Encounter: Payer: Self-pay | Admitting: Family Medicine

## 2019-12-13 ENCOUNTER — Other Ambulatory Visit: Payer: Self-pay

## 2019-12-13 VITALS — BP 170/88 | HR 108 | Temp 97.8°F | Wt 227.6 lb

## 2019-12-13 DIAGNOSIS — I10 Essential (primary) hypertension: Secondary | ICD-10-CM | POA: Diagnosis not present

## 2019-12-13 DIAGNOSIS — Z13220 Encounter for screening for lipoid disorders: Secondary | ICD-10-CM | POA: Diagnosis not present

## 2019-12-13 DIAGNOSIS — R7303 Prediabetes: Secondary | ICD-10-CM | POA: Insufficient documentation

## 2019-12-13 DIAGNOSIS — E119 Type 2 diabetes mellitus without complications: Secondary | ICD-10-CM

## 2019-12-13 MED ORDER — LISINOPRIL 20 MG PO TABS
20.0000 mg | ORAL_TABLET | Freq: Every day | ORAL | 2 refills | Status: DC
Start: 2019-12-13 — End: 2020-03-13

## 2019-12-13 MED ORDER — TAMSULOSIN HCL 0.4 MG PO CAPS
0.8000 mg | ORAL_CAPSULE | Freq: Every day | ORAL | 2 refills | Status: DC
Start: 2019-12-13 — End: 2020-03-13

## 2019-12-13 MED ORDER — ATORVASTATIN CALCIUM 20 MG PO TABS: ORAL_TABLET | ORAL | 2 refills | Status: DC

## 2019-12-13 MED ORDER — GLUCOSE BLOOD VI STRP: ORAL_STRIP | 12 refills | Status: DC

## 2019-12-13 MED ORDER — SILDENAFIL CITRATE 20 MG PO TABS: ORAL_TABLET | ORAL | 5 refills | Status: DC

## 2019-12-13 NOTE — Patient Instructions (Addendum)
Take your blood pressure at  Home for next 2 weeks. Send me the readings through My Chart, we may have to adjust your blood pressure meds.  Go to get your labs drawn next week -- fasting.  Continue your walking routine.  Go see the nutritionist     DASH Eating Plan DASH stands for "Dietary Approaches to Stop Hypertension." The DASH eating plan is a healthy eating plan that has been shown to reduce high blood pressure (hypertension). It may also reduce your risk for type 2 diabetes, heart disease, and stroke. The DASH eating plan may also help with weight loss. What are tips for following this plan?  General guidelines  Avoid eating more than 2,300 mg (milligrams) of salt (sodium) a day. If you have hypertension, you may need to reduce your sodium intake to 1,500 mg a day.  Limit alcohol intake to no more than 1 drink a day for nonpregnant women and 2 drinks a day for men. One drink equals 12 oz of beer, 5 oz of wine, or 1 oz of hard liquor.  Work with your health care provider to maintain a healthy body weight or to lose weight. Ask what an ideal weight is for you.  Get at least 30 minutes of exercise that causes your heart to beat faster (aerobic exercise) most days of the week. Activities may include walking, swimming, or biking.  Work with your health care provider or diet and nutrition specialist (dietitian) to adjust your eating plan to your individual calorie needs. Reading food labels   Check food labels for the amount of sodium per serving. Choose foods with less than 5 percent of the Daily Value of sodium. Generally, foods with less than 300 mg of sodium per serving fit into this eating plan.  To find whole grains, look for the word "whole" as the first word in the ingredient list. Shopping  Buy products labeled as "low-sodium" or "no salt added."  Buy fresh foods. Avoid canned foods and premade or frozen meals. Cooking  Avoid adding salt when cooking. Use salt-free  seasonings or herbs instead of table salt or sea salt. Check with your health care provider or pharmacist before using salt substitutes.  Do not fry foods. Cook foods using healthy methods such as baking, boiling, grilling, and broiling instead.  Cook with heart-healthy oils, such as olive, canola, soybean, or sunflower oil. Meal planning  Eat a balanced diet that includes: ? 5 or more servings of fruits and vegetables each day. At each meal, try to fill half of your plate with fruits and vegetables. ? Up to 6-8 servings of whole grains each day. ? Less than 6 oz of lean meat, poultry, or fish each day. A 3-oz serving of meat is about the same size as a deck of cards. One egg equals 1 oz. ? 2 servings of low-fat dairy each day. ? A serving of nuts, seeds, or beans 5 times each week. ? Heart-healthy fats. Healthy fats called Omega-3 fatty acids are found in foods such as flaxseeds and coldwater fish, like sardines, salmon, and mackerel.  Limit how much you eat of the following: ? Canned or prepackaged foods. ? Food that is high in trans fat, such as fried foods. ? Food that is high in saturated fat, such as fatty meat. ? Sweets, desserts, sugary drinks, and other foods with added sugar. ? Full-fat dairy products.  Do not salt foods before eating.  Try to eat at least 2 vegetarian meals  each week.  Eat more home-cooked food and less restaurant, buffet, and fast food.  When eating at a restaurant, ask that your food be prepared with less salt or no salt, if possible. What foods are recommended? The items listed may not be a complete list. Talk with your dietitian about what dietary choices are best for you. Grains Whole-grain or whole-wheat bread. Whole-grain or whole-wheat pasta. Brown rice. [name]. Bulgur. Whole-grain and low-sodium cereals. Pita bread. Low-fat, low-sodium crackers. Whole-wheat flour tortillas. Vegetables Fresh or frozen vegetables (raw, steamed, roasted, or  grilled). Low-sodium or reduced-sodium tomato and vegetable juice. Low-sodium or reduced-sodium tomato sauce and tomato paste. Low-sodium or reduced-sodium canned vegetables. Fruits All fresh, dried, or frozen fruit. Canned fruit in natural juice (without added sugar). Meat and other protein foods Skinless chicken or [place]. Ground chicken or [place]. Pork with fat trimmed off. Fish and seafood. Egg whites. Dried beans, peas, or lentils. Unsalted nuts, nut butters, and seeds. Unsalted canned beans. Lean cuts of beef with fat trimmed off. Low-sodium, lean deli meat. Dairy Low-fat (1%) or fat-free (skim) milk. Fat-free, low-fat, or reduced-fat cheeses. Nonfat, low-sodium ricotta or cottage cheese. Low-fat or nonfat yogurt. Low-fat, low-sodium cheese. Fats and oils Soft margarine without trans fats. Vegetable oil. Low-fat, reduced-fat, or light mayonnaise and salad dressings (reduced-sodium). Canola, safflower, olive, soybean, and sunflower oils. Avocado. Seasoning and other foods Herbs. Spices. Seasoning mixes without salt. Unsalted popcorn and pretzels. Fat-free sweets. What foods are not recommended? The items listed may not be a complete list. Talk with your dietitian about what dietary choices are best for you. Grains Baked goods made with fat, such as croissants, muffins, or some breads. Dry pasta or rice meal packs. Vegetables Creamed or fried vegetables. Vegetables in a cheese sauce. Regular canned vegetables (not low-sodium or reduced-sodium). Regular canned tomato sauce and paste (not low-sodium or reduced-sodium). Regular tomato and vegetable juice (not low-sodium or reduced-sodium). [name]. Olives. Fruits Canned fruit in a light or heavy syrup. Fried fruit. Fruit in cream or butter sauce. Meat and other protein foods Fatty cuts of meat. Ribs. Fried meat. [name]. Sausage. Bologna and other processed lunch meats. Salami. Fatback. Hotdogs. Bratwurst. Salted nuts and seeds. Canned beans with  added salt. Canned or smoked fish. Whole eggs or egg yolks. Chicken or [place] with skin. Dairy Whole or 2% milk, cream, and half-and-half. Whole or full-fat cream cheese. Whole-fat or sweetened yogurt. Full-fat cheese. Nondairy creamers. Whipped toppings. Processed cheese and cheese spreads. Fats and oils Butter. Stick margarine. Lard. Shortening. Ghee. Bacon fat. Tropical oils, such as coconut, palm kernel, or palm oil. Seasoning and other foods Salted popcorn and pretzels. Onion salt, garlic salt, seasoned salt, table salt, and sea salt. Worcestershire sauce. Tartar sauce. Barbecue sauce. Teriyaki sauce. Soy sauce, including reduced-sodium. Steak sauce. Canned and packaged gravies. Fish sauce. Oyster sauce. Cocktail sauce. Horseradish that you find on the shelf. Ketchup. Mustard. Meat flavorings and tenderizers. Bouillon cubes. Hot sauce and Tabasco sauce. Premade or packaged marinades. Premade or packaged taco seasonings. Relishes. Regular salad dressings. Where to find more information:  National Heart, Lung, and [place]: https://wilson-eaton.com/  American Heart Association: www.heart.org Summary  The DASH eating plan is a healthy eating plan that has been shown to reduce high blood pressure (hypertension). It may also reduce your risk for type 2 diabetes, heart disease, and stroke.  With the DASH eating plan, you should limit salt (sodium) intake to 2,300 mg a day. If you have hypertension, you may need to reduce  your sodium intake to 1,500 mg a day.  When on the DASH eating plan, aim to eat more fresh fruits and vegetables, whole grains, lean proteins, low-fat dairy, and heart-healthy fats.  Work with your health care provider or diet and nutrition specialist (dietitian) to adjust your eating plan to your individual calorie needs. This information is not intended to replace advice given to you by your health care provider. Make sure you discuss any questions you have with your health care  provider. Document Revised: 03/03/2017 Document Reviewed: 03/14/2016 Elsevier Patient Education  2020 [employer].

## 2019-12-13 NOTE — Progress Notes (Addendum)
Patient ID: Thomas Summers, male    DOB: 10-07-1955, 64 y.o.   MRN: [id number]   Chief Complaint  Patient presents with  . Diabetes  . Hypertension  . Hyperlipidemia   Subjective:    Diabetes He presents for his follow-up diabetic visit. He has type 2 diabetes mellitus.  Hypertension This is a chronic problem. Treatments tried: Lipitor  There are no compliance problems.   Hyperlipidemia This is a chronic problem. Treatments tried: Atorvastatin. There are no compliance problems.      Medical History Thomas Summers has a past medical history of Allergy, Depression, Diabetes mellitus without complication ([place]), Hyperlipidemia, and Hypertension.   Outpatient Encounter Medications as of 12/13/2019  Medication Sig  . atorvastatin (LIPITOR) 20 MG tablet Take one tablet po at bedtime  . blood glucose meter kit and supplies KIT Dispense based on patient and insurance preference. ( One touch ) diabetes type 2. E11.9. Test blood sugar once a day  . glucose blood test strip Use as instructed  . lisinopril (ZESTRIL) 20 MG tablet Take 1 tablet (20 mg total) by mouth daily.  . ONE TOUCH ULTRA TEST test strip USE ONE STRIP TO CHECK GLUCOSE ONCE DAILY  . OneTouch Delica Lancets 48G MISC USE ONE  TO CHECK GLUCOSE ONCE DAILY  . sildenafil (REVATIO) 20 MG tablet Take 1 or 2 tablets po 2 hours before sex  . tamsulosin (FLOMAX) 0.4 MG CAPS capsule Take 2 capsules (0.8 mg total) by mouth daily after supper.  . [DISCONTINUED] atorvastatin (LIPITOR) 20 MG tablet Take one tablet po at bedtime  . [DISCONTINUED] lisinopril (ZESTRIL) 20 MG tablet Take 1 tablet (20 mg total) by mouth daily.  . [DISCONTINUED] sildenafil (REVATIO) 20 MG tablet Take 1 or 2 tablets po 2 hours before sex  . [DISCONTINUED] tamsulosin (FLOMAX) 0.4 MG CAPS capsule Take 2 capsules (0.8 mg total) by mouth daily after supper.   No facility-administered encounter medications on file as of 12/13/2019.     Review of Systems    Constitutional: Negative.   HENT: Negative.   Eyes: Negative.   Respiratory: Negative.   Cardiovascular: Negative.   Gastrointestinal: Negative.   Endocrine: Negative.   Genitourinary: Negative.   Musculoskeletal: Negative.   Allergic/Immunologic: Negative.   Neurological: Negative.   Hematological: Negative.   Psychiatric/Behavioral: Negative.      Vitals BP (!) 170/88   Pulse (!) 108   Temp 97.8 F (36.6 C)   Wt 227 lb 9.6 oz (103.2 kg)   SpO2 98%   BMI 29.62 kg/m   Objective:   Physical Exam Vitals and nursing note reviewed.  Constitutional:      Appearance: Normal appearance.  Cardiovascular:     Rate and Rhythm: Normal rate and regular rhythm.     Pulses: Normal pulses.     Heart sounds: Normal heart sounds.     Comments: Excellent pedal pulses on both feet Pulmonary:     Effort: Pulmonary effort is normal.     Breath sounds: Normal breath sounds.  Abdominal:     General: Bowel sounds are normal.     Palpations: Abdomen is soft.     Tenderness: There is no abdominal tenderness. There is no guarding.  Musculoskeletal:        General: Normal range of motion.  Skin:    General: Skin is warm and dry.  Neurological:     General: No focal deficit present.     Mental Status: He is alert and oriented to person,  place, and time.  Psychiatric:        Behavior: Behavior normal.      Assessment and Plan   1. Essential hypertension - Comprehensive Metabolic Panel (CMET)  2. Lipid screening - Lipid panel  3. Type 2 diabetes mellitus without complication, without long-term current use of insulin (HCC) - POCT glycosylated hemoglobin (Hb A1C) - Lipid panel - Hemoglobin A1c - Comprehensive Metabolic Panel (CMET) - Ambulatory referral to diabetic education   Thomas Summers presents today for a 6 month follow-up, his wellness exam was in February 2021. He has diabetes, hypertension, and hyperlipidemia. He recently started a walking routine: 1 mile most  days. Information given on DASH diet and blood pressure and blood sugar control. He agrees to see nutritionist to learn more about healthy eating. He reports fasting blood sugars run around 115.  Blood pressure today is elevated. Rechecked: 178/94. He will monitor pressures out of the office and send results through My Chart. Adjustments to current medications are likely.   Blood sugar goals: Normal: < 100   With diabetes to be considered in good control: Fasting: 90-130 Before meals: < 160 Before bedtime: < 180  Monitor your blood sugar at different times of the day.  Agrees with plan of care discussed today. Understands warning signs to seek further care: chest pain, shortness of breath. He will send me blood pressure readings in the next 2 weeks. Understands to follow-up in 3 months. We will discuss next steps at that time and see how lifestyle modifications are going at that time.  Pecolia Ades, FNP-C

## 2019-12-15 NOTE — Addendum Note (Signed)
Addended by: Chalmers Guest on: 12/15/2019 09:27 AM   Modules accepted: Level of Service

## 2019-12-16 ENCOUNTER — Encounter: Payer: Self-pay | Admitting: Family Medicine

## 2019-12-18 ENCOUNTER — Other Ambulatory Visit: Payer: Self-pay

## 2019-12-18 MED ORDER — GLUCOSE BLOOD VI STRP: ORAL_STRIP | 12 refills | Status: DC

## 2019-12-19 LAB — LIPID PANEL
Chol/HDL Ratio: 2.4 ratio (ref 0.0–5.0)
Cholesterol, Total: 128 mg/dL (ref 100–199)
HDL: 53 mg/dL (ref >39)
LDL Chol Calc (NIH): 60 mg/dL (ref 0–99)
Triglycerides: 78 mg/dL (ref 0–149)
VLDL Cholesterol Cal: 15 mg/dL (ref 5–40)

## 2019-12-19 LAB — COMPREHENSIVE METABOLIC PANEL
ALT: 65 IU/L — ABNORMAL HIGH (ref 0–44)
AST: 36 IU/L (ref 0–40)
Albumin/Globulin Ratio: 1.8 (ref 1.2–2.2)
Albumin: 4.6 g/dL (ref 3.8–4.8)
Alkaline Phosphatase: 142 IU/L — ABNORMAL HIGH (ref 44–121)
BUN/Creatinine Ratio: 17 (ref 10–24)
BUN: 15 mg/dL (ref 8–27)
Bilirubin Total: 0.7 mg/dL (ref 0.0–1.2)
CO2: 24 mmol/L (ref 20–29)
Calcium: 9 mg/dL (ref 8.6–10.2)
Chloride: 103 mmol/L (ref 96–106)
Creatinine, Ser: 0.89 mg/dL (ref 0.76–1.27)
GFR calc Af Amer: 104 mL/min/{1.73_m2} (ref >59)
GFR calc non Af Amer: 90 mL/min/{1.73_m2} (ref >59)
Globulin, Total: 2.5 g/dL (ref 1.5–4.5)
Glucose: 89 mg/dL (ref 65–99)
Potassium: 4.7 mmol/L (ref 3.5–5.2)
Sodium: 142 mmol/L (ref 134–144)
Total Protein: 7.1 g/dL (ref 6.0–8.5)

## 2019-12-19 LAB — HEMOGLOBIN A1C
Est. average glucose Bld gHb Est-mCnc: 140 mg/dL
Hgb A1c MFr Bld: 6.5 % — ABNORMAL HIGH (ref 4.8–5.6)

## 2019-12-24 ENCOUNTER — Other Ambulatory Visit: Payer: Self-pay | Admitting: *Deleted

## 2019-12-24 DIAGNOSIS — R7401 Elevation of levels of liver transaminase levels: Secondary | ICD-10-CM

## 2019-12-24 DIAGNOSIS — E119 Type 2 diabetes mellitus without complications: Secondary | ICD-10-CM

## 2019-12-24 DIAGNOSIS — R7989 Other specified abnormal findings of blood chemistry: Secondary | ICD-10-CM

## 2020-01-08 ENCOUNTER — Other Ambulatory Visit: Payer: Self-pay

## 2020-01-08 ENCOUNTER — Ambulatory Visit (HOSPITAL_COMMUNITY)
Admission: RE | Admit: 2020-01-08 | Discharge: 2020-01-08 | Disposition: A | Discharge status: Home / Self Care | Payer: 59 | Source: Ambulatory Visit | Attending: Family Medicine | Admitting: Family Medicine

## 2020-01-08 DIAGNOSIS — R7989 Other specified abnormal findings of blood chemistry: Secondary | ICD-10-CM | POA: Diagnosis present

## 2020-01-08 DIAGNOSIS — R7401 Elevation of levels of liver transaminase levels: Secondary | ICD-10-CM | POA: Diagnosis present

## 2020-01-21 ENCOUNTER — Ambulatory Visit: Payer: 59 | Admitting: Nutrition

## 2020-02-10 ENCOUNTER — Telehealth: Payer: Self-pay | Admitting: Family Medicine

## 2020-02-10 MED ORDER — ONETOUCH DELICA LANCETS 33G MISC
1 refills | Status: DC
Start: 2020-02-10 — End: 2020-11-27

## 2020-02-10 NOTE — Telephone Encounter (Signed)
[facility] requesting refill on One Touch Lancets. Pt last seen 12/24/19. Please advise. Thank you

## 2020-03-03 LAB — HEPATIC FUNCTION PANEL
ALT: 81 IU/L — ABNORMAL HIGH (ref 0–44)
AST: 45 IU/L — ABNORMAL HIGH (ref 0–40)
Albumin: 4.3 g/dL (ref 3.8–4.8)
Alkaline Phosphatase: 149 IU/L — ABNORMAL HIGH (ref 44–121)
Bilirubin Total: 0.5 mg/dL (ref 0.0–1.2)
Bilirubin, Direct: 0.16 mg/dL (ref 0.00–0.40)
Total Protein: 6.8 g/dL (ref 6.0–8.5)

## 2020-03-03 LAB — IRON AND TIBC
Iron Saturation: 29 % (ref 15–55)
Iron: 92 ug/dL (ref 38–169)
Total Iron Binding Capacity: 318 ug/dL (ref 250–450)
UIBC: 226 ug/dL (ref 111–343)

## 2020-03-03 LAB — HEMOGLOBIN A1C
Est. average glucose Bld gHb Est-mCnc: 117 mg/dL
Hgb A1c MFr Bld: 5.7 % — ABNORMAL HIGH (ref 4.8–5.6)

## 2020-03-03 LAB — FERRITIN: Ferritin: 198 ng/mL (ref 30–400)

## 2020-03-13 ENCOUNTER — Ambulatory Visit: Payer: 59 | Admitting: Family Medicine

## 2020-03-13 ENCOUNTER — Encounter: Payer: Self-pay | Admitting: Family Medicine

## 2020-03-13 ENCOUNTER — Other Ambulatory Visit: Payer: Self-pay

## 2020-03-13 VITALS — BP 128/82 | HR 97 | Temp 97.4°F | Ht 73.5 in | Wt 210.6 lb

## 2020-03-13 DIAGNOSIS — K760 Fatty (change of) liver, not elsewhere classified: Secondary | ICD-10-CM

## 2020-03-13 DIAGNOSIS — N401 Enlarged prostate with lower urinary tract symptoms: Secondary | ICD-10-CM

## 2020-03-13 DIAGNOSIS — R351 Nocturia: Secondary | ICD-10-CM

## 2020-03-13 DIAGNOSIS — N400 Enlarged prostate without lower urinary tract symptoms: Secondary | ICD-10-CM

## 2020-03-13 DIAGNOSIS — E119 Type 2 diabetes mellitus without complications: Secondary | ICD-10-CM

## 2020-03-13 DIAGNOSIS — R748 Abnormal levels of other serum enzymes: Secondary | ICD-10-CM

## 2020-03-13 MED ORDER — TAMSULOSIN HCL 0.4 MG PO CAPS: 0.8000 mg | ORAL_CAPSULE | Freq: Every day | ORAL | 1 refills | Status: DC

## 2020-03-13 MED ORDER — ATORVASTATIN CALCIUM 20 MG PO TABS: ORAL_TABLET | ORAL | 1 refills | Status: DC

## 2020-03-13 MED ORDER — LISINOPRIL 20 MG PO TABS: 20.0000 mg | ORAL_TABLET | Freq: Every day | ORAL | 1 refills | Status: DC

## 2020-03-13 NOTE — Progress Notes (Signed)
Patient ID: Thomas Summers, male    DOB: June 08, 1955, 64 y.o.   MRN: [id number]   Chief Complaint  Patient presents with  . Diabetes    Follow up - discuss recent labs   Subjective:    HPI  Pt seen for f/u DM2 and elevated LFTs.  Elevated Lfts.  Ast -45 and Alt- 81.  Per the chart has had elevated LFTs since 2016. Fatty liver diease and normal elasticity testing on liver u/s in 10/21. No alcohol intake currently, never drank in past.  1 beer a month or less.  Once got diabetes stopped drinking.  Lost 17 lbs  since sept 2021.  Dec weight down to 210. Has a1c 5.7.   In 2016- hep C panel normal. Hep B- negative.  Getting up 2-3 times at night to urinate.  On flomax 0.26m qhs. Still having urination. Leaking on himself. Dec stream. Feeling not fully emptying.  Dm2- Compliant with medications. Checking blood glucose.   Not seeing any high or low numbers.  Denies polyuria or polydipsia.  Eye exam: overdue, last one 2019. Foot exam: no concerns.  Medical History GBenjiehas a past medical history of Allergy, Depression, Diabetes mellitus without complication ([place], Hyperlipidemia, and Hypertension.   Outpatient Encounter Medications as of 03/13/2020  Medication Sig  . atorvastatin (LIPITOR) 20 MG tablet Take one tablet po at bedtime  . blood glucose meter kit and supplies KIT Dispense based on patient and insurance preference. ( One touch ) diabetes type 2. E11.9. Test blood sugar once a day  . glucose blood test strip Use one strip to check blood sugars once per day  . lisinopril (ZESTRIL) 20 MG tablet Take 1 tablet (20 mg total) by mouth daily.  . ONE TOUCH ULTRA TEST test strip USE ONE STRIP TO CHECK GLUCOSE ONCE DAILY  . OneTouch Delica Lancets 392JMISC USE ONE  TO CHECK GLUCOSE ONCE DAILY  . sildenafil (REVATIO) 20 MG tablet Take 1 or 2 tablets po 2 hours before sex  . tamsulosin (FLOMAX) 0.4 MG CAPS capsule Take 2 capsules (0.8 mg total) by mouth daily after  supper.  . [DISCONTINUED] atorvastatin (LIPITOR) 20 MG tablet Take one tablet po at bedtime  . [DISCONTINUED] lisinopril (ZESTRIL) 20 MG tablet Take 1 tablet (20 mg total) by mouth daily.  . [DISCONTINUED] tamsulosin (FLOMAX) 0.4 MG CAPS capsule Take 2 capsules (0.8 mg total) by mouth daily after supper.   No facility-administered encounter medications on file as of 03/13/2020.     Review of Systems  Constitutional: Negative for chills and fever.  HENT: Negative for congestion, rhinorrhea and sore throat.   Respiratory: Negative for cough, shortness of breath and wheezing.   Cardiovascular: Negative for chest pain and leg swelling.  Gastrointestinal: Negative for abdominal pain, diarrhea, nausea and vomiting.  Genitourinary: Negative for dysuria and frequency.  Skin: Negative for rash.  Neurological: Negative for dizziness, weakness and headaches.     Vitals BP 128/82   Pulse 97   Temp (!) 97.4 F (36.3 C) (Oral)   Ht 6' 1.5" (1.867 m)   Wt 210 lb 9.6 oz (95.5 kg)   SpO2 99%   BMI 27.41 kg/m   Objective:   Physical Exam Vitals and nursing note reviewed.  Constitutional:      General: He is not in acute distress.    Appearance: Normal appearance. He is not ill-appearing.  HENT:     Head: Normocephalic.     Nose: Nose normal. No  congestion.     Mouth/Throat:     Mouth: Mucous membranes are moist.     Pharynx: No oropharyngeal exudate.  Eyes:     Extraocular Movements: Extraocular movements intact.     Conjunctiva/sclera: Conjunctivae normal.     Pupils: Pupils are equal, round, and reactive to light.  Cardiovascular:     Rate and Rhythm: Normal rate and regular rhythm.     Pulses: Normal pulses.     Heart sounds: Normal heart sounds. No murmur heard.   Pulmonary:     Effort: Pulmonary effort is normal.     Breath sounds: Normal breath sounds. No wheezing, rhonchi or rales.  Abdominal:     General: Bowel sounds are normal. There is no distension.      Palpations: Abdomen is soft. There is no mass.     Tenderness: There is no abdominal tenderness. There is no guarding or rebound.     Hernia: No hernia is present.  Musculoskeletal:        General: Normal range of motion.     Right lower leg: No edema.     Left lower leg: No edema.  Skin:    General: Skin is warm and dry.     Findings: No rash.  Neurological:     General: No focal deficit present.     Mental Status: He is alert and oriented to person, place, and time.     Cranial Nerves: No cranial nerve deficit.  Psychiatric:        Mood and Affect: Mood normal.        Behavior: Behavior normal.        Thought Content: Thought content normal.        Judgment: Judgment normal.      Assessment and Plan   1. Type 2 diabetes mellitus without complication, without long-term current use of insulin ([place])  2. Fatty liver - Ambulatory referral to Gastroenterology  3. Elevated liver enzymes - Ambulatory referral to Gastroenterology  4. Nocturia associated with benign prostatic hyperplasia - Ambulatory referral to Urology  5. Prostate hypertrophy - Ambulatory referral to Urology   htn- improved, stable. Cont meds. Was elevated last visit, but improved today and at home seeing [phone] systolic.  Elevated LFTs- referral to GI.   BPH- referral to urology, since worsening since being on max dose of flomax.   dm2- a1c has imporved to 5.7.  Cont to monitor and decrease carb intake.  hld- stable. Cont meds.  F/u [place] prn.

## 2020-03-16 ENCOUNTER — Encounter (INDEPENDENT_AMBULATORY_CARE_PROVIDER_SITE_OTHER): Payer: Self-pay | Admitting: *Deleted

## 2020-04-29 ENCOUNTER — Ambulatory Visit (INDEPENDENT_AMBULATORY_CARE_PROVIDER_SITE_OTHER): Payer: 59 | Admitting: Urology

## 2020-04-29 ENCOUNTER — Encounter: Payer: Self-pay | Admitting: Urology

## 2020-04-29 ENCOUNTER — Other Ambulatory Visit: Payer: Self-pay

## 2020-04-29 VITALS — BP 157/90 | HR 71 | Temp 98.3°F | Ht 73.0 in | Wt 209.0 lb

## 2020-04-29 DIAGNOSIS — N400 Enlarged prostate without lower urinary tract symptoms: Secondary | ICD-10-CM

## 2020-04-29 DIAGNOSIS — R339 Retention of urine, unspecified: Secondary | ICD-10-CM

## 2020-04-29 LAB — URINALYSIS, ROUTINE W REFLEX MICROSCOPIC
Bilirubin, UA: NEGATIVE
Glucose, UA: NEGATIVE
Ketones, UA: NEGATIVE
Leukocytes,UA: NEGATIVE
Nitrite, UA: NEGATIVE
Protein,UA: NEGATIVE
Specific Gravity, UA: 1.01 (ref 1.005–1.030)
Urobilinogen, Ur: 0.2 mg/dL (ref 0.2–1.0)
pH, UA: 7 (ref 5.0–7.5)

## 2020-04-29 LAB — MICROSCOPIC EXAMINATION
Renal Epithel, UA: NONE SEEN /hpf
WBC, UA: NONE SEEN /hpf (ref 0–5)

## 2020-04-29 LAB — BLADDER SCAN AMB NON-IMAGING: Scan Result: 128

## 2020-04-29 MED ORDER — SILODOSIN 8 MG PO CAPS: 8.0000 mg | ORAL_CAPSULE | Freq: Every day | ORAL | 11 refills | Status: DC

## 2020-04-29 NOTE — Progress Notes (Signed)
04/29/2020 11:26 AM   Thomas Summers 25-Feb-1956 [id number]  Referring provider: Erven Colla, DO [place],  [place] [address]  Urinary frequency  HPI: Thomas Summers is a 65yo here for evaluation of urinary frequency and nocturia. PVR 128cc. For the past 6 years he has had worsening LUTS. 4 years ago he was started on flomax 0.2m daily and then increased to 0.812mdaily. Urine stream fair. His urinary frequency is every 2 hours and he has nocturia 2x. He has associated urinary hesitancy, starting/stopping, and dribbling. He has urinary urgency and daily urge incontinence. No dysuria or hematuria. Overall he is unhappy with his urination.  He had 2 prostate infections in the past 5 years. PSAs have been normal.  PMH: Past Medical History:  Diagnosis Date  . Allergy   . Depression   . Diabetes mellitus without complication ([place]  . Heart murmur   . Hyperlipidemia   . Hypertension     Surgical History: Past Surgical History:  Procedure Laterality Date  . COLONOSCOPY N/A 08/13/2015   Procedure: COLONOSCOPY;  Surgeon: NaRogene HoustonMD;  Location: [facility];  Service: Endoscopy;  Laterality: N/A;  1200  . MENISCUS REPAIR Left 1997  . OTHER SURGICAL HISTORY     left ear surgery  . OTHER SURGICAL HISTORY     fracture arm  . TONSILLECTOMY      Home Medications:  Allergies as of 04/29/2020   No Known Allergies     Medication List       Accurate as of April 29, 2020 11:26 AM. If you have any questions, ask your nurse or doctor.        atorvastatin 20 MG tablet Commonly known as: LIPITOR Take one tablet po at bedtime   blood glucose meter kit and supplies Kit Dispense based on patient and insurance preference. ( One touch ) diabetes type 2. E11.9. Test blood sugar once a day   lisinopril 20 MG tablet Commonly known as: ZESTRIL Take 1 tablet (20 mg total) by mouth daily.   ONE TOUCH ULTRA TEST test strip Generic drug: glucose blood USE  ONE STRIP TO CHECK GLUCOSE ONCE DAILY   glucose blood test strip Use one strip to check blood sugars once per day   OneTouch Delica Lancets 3340Xisc USE ONE  TO CHECK GLUCOSE ONCE DAILY   sildenafil 20 MG tablet Commonly known as: REVATIO Take 1 or 2 tablets po 2 hours before sex   tamsulosin 0.4 MG Caps capsule Commonly known as: FLOMAX Take 2 capsules (0.8 mg total) by mouth daily after supper.       Allergies: No Known Allergies  Family History: Family History  Problem Relation Age of Onset  . Hypertension Father   . Heart disease Father     Social History:  reports that he has never smoked. He has never used smokeless tobacco. He reports that he does not drink alcohol and does not use drugs.  ROS: All other review of systems were reviewed and are negative except what is noted above in HPI  Physical Exam: BP (!) 157/90   Pulse 71   Temp 98.3 F (36.8 C) (Oral)   Ht '6\' 1"'  (1.854 m)   Wt 209 lb (94.8 kg)   BMI 27.57 kg/m   Constitutional:  Alert and oriented, No acute distress. HEENT: [place] AT, moist mucus membranes.  Trachea midline, no masses. Cardiovascular: No clubbing, cyanosis, or edema. Respiratory: Normal respiratory effort, no increased  work of breathing. GI: Abdomen is soft, nontender, nondistended, no abdominal masses GU: No CVA tenderness. Circumcised phallus. No masses/lesions on penis, testis, scrotum. Prostate 40g smooth no nodules no induration.  Lymph: No cervical or inguinal lymphadenopathy. Skin: No rashes, bruises or suspicious lesions. Neurologic: Grossly intact, no focal deficits, moving all 4 extremities. Psychiatric: Normal mood and affect.  Laboratory Data: Lab Results  Component Value Date   WBC 9.4 04/25/2018   HGB 16.3 04/25/2018   HCT 47.6 04/25/2018   MCV 90 04/25/2018   PLT 250 04/25/2018    Lab Results  Component Value Date   CREATININE 0.89 12/18/2019    Lab Results  Component Value Date   PSA 1.9 06/25/2014    No  results found for: TESTOSTERONE  Lab Results  Component Value Date   HGBA1C 5.7 (H) 03/02/2020    Urinalysis    Component Value Date/Time   COLORURINE YELLOW 06/27/2015 2050   APPEARANCEUR CLEAR 06/27/2015 2050   LABSPEC 1.010 06/27/2015 2050   PHURINE 6.0 06/27/2015 2050   GLUCOSEU NEGATIVE 06/27/2015 2050   HGBUR SMALL (A) 06/27/2015 2050   BILIRUBINUR 2+ 04/25/2018 1018   KETONESUR 15 (A) 06/27/2015 2050   PROTEINUR NEGATIVE 06/27/2015 2050   NITRITE NEGATIVE 06/27/2015 2050   LEUKOCYTESUR SMALL (A) 06/27/2015 2050    Lab Results  Component Value Date   LABMICR 23.2 05/08/2019   BACTERIA RARE (A) 06/27/2015    Pertinent Imaging:  No results found for this or any previous visit.  No results found for this or any previous visit.  No results found for this or any previous visit.  No results found for this or any previous visit.  No results found for this or any previous visit.  No results found for this or any previous visit.  No results found for this or any previous visit.  No results found for this or any previous visit.   Assessment & Plan:    1. Prostate hypertrophy -We will try rapaflo 27m qhs - Urinalysis, Routine w reflex microscopic - BLADDER SCAN AMB NON-IMAGING  2. Incomplete emptying of bladder -Rapaflo 877mqhs -RTC 4 weeks with PVR   No follow-ups on file.  PaNicolette BangMD  [facility] [place]

## 2020-04-29 NOTE — Patient Instructions (Signed)

## 2020-04-29 NOTE — Progress Notes (Signed)
Bladder Scan Patient can void: 128 ml Performed By: Durenda Guthrie, lpn   Urological Symptom Review  Patient is experiencing the following symptoms: Frequent urination Hard to postpone urination Get up at night to urinate Leakage of urine Erection problems (male only)   Review of Systems  Gastrointestinal (upper)  : Negative for upper GI symptoms  Gastrointestinal (lower) : Negative for lower GI symptoms  Constitutional : Negative for symptoms  Skin: Negative for skin symptoms  Eyes: Negative for eye symptoms  Ear/Nose/Throat : Negative for Ear/Nose/Throat symptoms  Hematologic/Lymphatic: Negative for Hematologic/Lymphatic symptoms  Cardiovascular : Negative for cardiovascular symptoms  Respiratory : Negative for respiratory symptoms  Endocrine: Negative for endocrine symptoms  Musculoskeletal: Negative for musculoskeletal symptoms  Neurological: Negative for neurological symptoms  Psychologic: Negative for psychiatric symptoms

## 2020-05-06 ENCOUNTER — Encounter (INDEPENDENT_AMBULATORY_CARE_PROVIDER_SITE_OTHER): Payer: Self-pay | Admitting: Internal Medicine

## 2020-05-06 ENCOUNTER — Other Ambulatory Visit: Payer: Self-pay

## 2020-05-06 ENCOUNTER — Ambulatory Visit (INDEPENDENT_AMBULATORY_CARE_PROVIDER_SITE_OTHER): Payer: 59 | Admitting: Internal Medicine

## 2020-05-06 VITALS — BP 123/76 | HR 80 | Temp 98.0°F | Ht 73.0 in | Wt 215.0 lb

## 2020-05-06 DIAGNOSIS — R945 Abnormal results of liver function studies: Secondary | ICD-10-CM | POA: Diagnosis not present

## 2020-05-06 DIAGNOSIS — R7989 Other specified abnormal findings of blood chemistry: Secondary | ICD-10-CM

## 2020-05-06 DIAGNOSIS — K760 Fatty (change of) liver, not elsewhere classified: Secondary | ICD-10-CM | POA: Diagnosis not present

## 2020-05-06 NOTE — Patient Instructions (Signed)
Please keep up with physical activity i.e. walking regularly as discussed Physician will call with results of blood work when completed.

## 2020-05-06 NOTE — Progress Notes (Signed)
Reason for consultation  Elevated transaminases  History of present illness  Patient is 65 year old Caucasian male who was referred through courtesy of Dr. Elvia Collum for evaluation of elevated transaminases.  He was first noted to have elevated transaminases back in March 2016 when AST was 69 and ALT was 173.  Work-up included abdominal ultrasound revealing fatty liver.  Hepatitis B surface antigen was negative and hepatitis C virus antibody was nonreactive.  Serum ferritin was mildly elevated at 596. ALT and ALT were 74 and 194 respectively in March 2017 and since then has AST has remained between 27 and 45 and  and ALT has remained mildly elevated ranging between 56 and 98. He had abdominal ultrasound elastography in September 2021.  He was noted to have fatty liver hepatic cysts and elastography revealed kPa 4.5. Patient states he had icteric hepatitis A at age 14.  He has not had any problems since then. He was diagnosed with diabetes mellitus about 5 years ago.  He feels this control has been excellent and his A1c has remained below 6. He has good appetite.  He denies nausea vomiting heartburn dysphagia abdominal pain or pruritus.  His bowels move regularly.  He denies melena or rectal bleeding.  He states he has been walking regularly and has lost 17 pounds in the last 4 months.  He also has increased fiber in his diet and as a result his bowels have become more regular. He has not been vaccinated for hepatitis B. Family history is negative for chronic liver disease.   Current Medications: Outpatient Encounter Medications as of 05/06/2020  Medication Sig  . aspirin EC 81 MG tablet Take 81 mg by mouth daily. Swallow whole.  Marland Kitchen atorvastatin (LIPITOR) 20 MG tablet Take one tablet po at bedtime  . blood glucose meter kit and supplies KIT Dispense based on patient and insurance preference. ( One touch ) diabetes type 2. E11.9. Test blood sugar once a day  . glucose blood test strip Use one  strip to check blood sugars once per day  . lisinopril (ZESTRIL) 20 MG tablet Take 1 tablet (20 mg total) by mouth daily.  . ONE TOUCH ULTRA TEST test strip USE ONE STRIP TO CHECK GLUCOSE ONCE DAILY  . OneTouch Delica Lancets 59D MISC USE ONE  TO CHECK GLUCOSE ONCE DAILY  . sildenafil (REVATIO) 20 MG tablet Take 1 or 2 tablets po 2 hours before sex  . silodosin (RAPAFLO) 8 MG CAPS capsule Take 1 capsule (8 mg total) by mouth daily with breakfast.  . [DISCONTINUED] tamsulosin (FLOMAX) 0.4 MG CAPS capsule Take 2 capsules (0.8 mg total) by mouth daily after supper. (Patient not taking: Reported on 05/06/2020)   No facility-administered encounter medications on file as of 05/06/2020.   Past medical history  Hypertension for about 7 years Diabetes mellitus diagnosed about 5 years ago Hyperlipidemia.  He has been on therapy for 1 year. Erectile dysfunction. History of colonic adenomas.  Last colonoscopy was in May 2017 with removal of 3 small polyps.  2 polyps were tubular adenomas and the third polyp was lipoma.  He was also noted to have 2 cm submucosal lesion at cecum felt to be leiomyoma.  He was supposed to have CT but not sure was done.  Tonsillectomy. Left knee arthroscopy in 1997 for meniscus repair Left ear surgery x3.  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  Allergies  No Known Allergies  Family history  Father lived to  be 98 years old.  He had coronary artery disease and had CHF.  Mother is 64 years old and in good health. He has 2 brothers and 2 sisters in good health.  Social history  He is married.  He has 2 daughters.  His daughter age 37 was recently diagnosed with Crohn's disease.  Daughter age 26 is in good health. He is retired.  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  He has never smoked cigarettes and does not drink alcohol. He is very active and walks just about every day for at least 20 minutes   Physical examination  Blood  pressure 123/76, pulse 80, temperature 98 F (36.7 C), temperature source Oral, height '6\' 1"'  (1.854 m), weight 215 lb (97.5 kg). Patient is alert and in no acute distress. He is wearing a mask. Conjunctiva is pink. Sclera is nonicteric Oropharyngeal mucosa is normal. No neck masses or thyromegaly noted. Cardiac exam with regular rhythm normal S1 and S2. No murmur or gallop noted. Lungs are clear to auscultation. Abdomen is full.  Bowel sounds are normal.  On palpation abdomen is soft and nontender.  Liver edge is easily palpable below the right costal margin.  Liver is not enlarged.  Spleen is not palpable. No LE edema or clubbing noted.  Labs/studies Results:  CBC Latest Ref Rng & Units 04/25/2018 06/27/2015  WBC 3.4 - 10.8 x10E3/uL 9.4 18.5(H)  Hemoglobin 13.0 - 17.7 g/dL 16.3 15.1  Hematocrit 37.5 - 51.0 % 47.6 42.9  Platelets 150 - 450 x10E3/uL 250 200    CMP Latest Ref Rng & Units 03/02/2020 12/18/2019 05/08/2019  Glucose 65 - 99 mg/dL - 89 103(H)  BUN 8 - 27 mg/dL - 15 13  Creatinine 0.76 - 1.27 mg/dL - 0.89 0.91  Sodium 134 - 144 mmol/L - 142 143  Potassium 3.5 - 5.2 mmol/L - 4.7 4.2  Chloride 96 - 106 mmol/L - 103 105  CO2 20 - 29 mmol/L - 24 22  Calcium 8.6 - 10.2 mg/dL - 9.0 9.1  Total Protein 6.0 - 8.5 g/dL 6.8 7.1 7.0  Total Bilirubin 0.0 - 1.2 mg/dL 0.5 0.7 0.5  Alkaline Phos 44 - 121 IU/L 149(H) 142(H) 98  AST 0 - 40 IU/L 45(H) 36 39  ALT 0 - 44 IU/L 81(H) 65(H) 97(H)    Hepatic Function Latest Ref Rng & Units 03/02/2020 12/18/2019 05/08/2019  Total Protein 6.0 - 8.5 g/dL 6.8 7.1 7.0  Albumin 3.8 - 4.8 g/dL 4.3 4.6 4.4  AST 0 - 40 IU/L 45(H) 36 39  ALT 0 - 44 IU/L 81(H) 65(H) 97(H)  Alk Phosphatase 44 - 121 IU/L 149(H) 142(H) 98  Total Bilirubin 0.0 - 1.2 mg/dL 0.5 0.7 0.5  Bilirubin, Direct 0.00 - 0.40 mg/dL 0.16 - 0.13    Prior imaging studies  Abdominal ultrasound on Aug 16, 2014  Gallbladder without gallstones or wall thickening.  Bile duct measure 3.6 mm.   Echogenic liver consistent with fatty infiltration. 2 hepatic cyst.  Largest one measured 13 mm.   Right upper quadrant abdominal ultrasound and elastography on 12/04/2019  Gallbladder without gallstones.  Bile duct is 7 mm.  Liver is diffusely echogenic.  2 hepatic cysts largest one in left lobe measuring 26 x 14 x 20 mm.  Normal portal flow. Elastography revealed  kPa of 4.5  Assessment:  #1.  Elevated transaminases initially discovered in March 2016.  Etiology would appear to be fatty liver as suspected based on initial  evaluation.  He tested negative for hepatitis B and C.  He suffered hepatitis A at age 67 which is usually self-limiting disease and not precursor to chronic liver disease.  He had ultrasound and elastography in September last year.  Elastography does not suggest fibrosis.  His risk factors include diabetes mellitus.  His transaminases have not returned to normal despite optimal control of his diabetes medicines and regular physical activity.  Therefore will rule out other conditions. It is interesting to note that bile duct diameter is increased significantly since prior ultrasound of May 2016 but still at upper limit of normal.  His alkaline phosphatase is also mildly elevated.  #2.  History of colonic adenomas.  He had 2 adenomas removed back in May 2017.  He will be due for surveillance colonoscopy this year.  Recommendations  Patient will go lab for the following tests. LFTs, hepatitis B surface antibody, serum iron TIBC and ferritin, sed rate, ANA, SMA, AMA, ceruloplasmin and alpha-1 antitrypsin. Patient encouraged to keep up with his regular physical activity.  He should try to walk at least for 30 minutes each time for minimum of 2 hours/week. I will be contacting patient with results of blood work and further recommendations per Surveillance colonoscopy later this year when pandemic over. Office visit in 1 year.

## 2020-05-10 LAB — HEPATIC FUNCTION PANEL
AG Ratio: 1.9 (calc) (ref 1.0–2.5)
ALT: 44 U/L (ref 9–46)
AST: 24 U/L (ref 10–35)
Albumin: 4.5 g/dL (ref 3.6–5.1)
Alkaline phosphatase (APISO): 97 U/L (ref 35–144)
Bilirubin, Direct: 0.2 mg/dL (ref 0.0–0.2)
Globulin: 2.4 g/dL (calc) (ref 1.9–3.7)
Indirect Bilirubin: 0.4 mg/dL (calc) (ref 0.2–1.2)
Total Bilirubin: 0.6 mg/dL (ref 0.2–1.2)
Total Protein: 6.9 g/dL (ref 6.1–8.1)

## 2020-05-10 LAB — ALPHA-1-ANTITRYPSIN: A-1 Antitrypsin, Ser: 152 mg/dL (ref 83–199)

## 2020-05-10 LAB — ANA: Anti Nuclear Antibody (ANA): NEGATIVE

## 2020-05-10 LAB — MITOCHONDRIAL ANTIBODIES: Mitochondrial M2 Ab, IgG: <=20 U

## 2020-05-10 LAB — IRON,TIBC AND FERRITIN PANEL
%SAT: 24 % (calc) (ref 20–48)
Ferritin: 93 ng/mL (ref 24–380)
Iron: 84 ug/dL (ref 50–180)
TIBC: 344 mcg/dL (calc) (ref 250–425)

## 2020-05-10 LAB — SEDIMENTATION RATE: Sed Rate: 9 mm/h (ref 0–20)

## 2020-05-10 LAB — HEPATITIS B SURFACE ANTIBODY,QUALITATIVE: Hep B S Ab: NONREACTIVE

## 2020-05-10 LAB — CERULOPLASMIN: Ceruloplasmin: 25 mg/dL (ref 18–36)

## 2020-05-10 LAB — ANTI-SMOOTH MUSCLE ANTIBODY, IGG: Actin (Smooth Muscle) Antibody (IGG): 22 U — ABNORMAL HIGH (ref <20)

## 2020-05-20 ENCOUNTER — Encounter: Payer: 59 | Admitting: Family Medicine

## 2020-05-29 ENCOUNTER — Ambulatory Visit (INDEPENDENT_AMBULATORY_CARE_PROVIDER_SITE_OTHER): Payer: 59 | Admitting: Urology

## 2020-05-29 ENCOUNTER — Other Ambulatory Visit: Payer: Self-pay

## 2020-05-29 ENCOUNTER — Encounter: Payer: Self-pay | Admitting: Urology

## 2020-05-29 VITALS — BP 130/71 | HR 90 | Temp 98.3°F | Ht 72.0 in | Wt 215.0 lb

## 2020-05-29 DIAGNOSIS — R339 Retention of urine, unspecified: Secondary | ICD-10-CM | POA: Diagnosis not present

## 2020-05-29 DIAGNOSIS — R351 Nocturia: Secondary | ICD-10-CM | POA: Diagnosis not present

## 2020-05-29 DIAGNOSIS — N400 Enlarged prostate without lower urinary tract symptoms: Secondary | ICD-10-CM

## 2020-05-29 LAB — URINALYSIS, ROUTINE W REFLEX MICROSCOPIC
Bilirubin, UA: NEGATIVE
Glucose, UA: NEGATIVE
Ketones, UA: NEGATIVE
Leukocytes,UA: NEGATIVE
Nitrite, UA: NEGATIVE
Protein,UA: NEGATIVE
RBC, UA: NEGATIVE
Specific Gravity, UA: 1.02 (ref 1.005–1.030)
Urobilinogen, Ur: 1 mg/dL (ref 0.2–1.0)
pH, UA: 7 (ref 5.0–7.5)

## 2020-05-29 LAB — BLADDER SCAN AMB NON-IMAGING: Scan Result: 21

## 2020-05-29 MED ORDER — MIRABEGRON ER 25 MG PO TB24: 25.0000 mg | ORAL_TABLET | Freq: Every day | ORAL | 0 refills | Status: DC

## 2020-05-29 NOTE — Progress Notes (Signed)
Bladder Scan Patient can void: 21 ml Performed By: Durenda Guthrie, lpn  Urological Symptom Review  Patient is experiencing the following symptoms: Frequent urination Hard to postpone urination Get up at night to urinate Leakage of urine Erection problems (male only)   Review of Systems  Gastrointestinal (upper)  : Negative for upper GI symptoms  Gastrointestinal (lower) : Negative for lower GI symptoms  Constitutional : Negative for symptoms  Skin: Negative for skin symptoms  Eyes: Negative for eye symptoms  Ear/Nose/Throat : Negative for Ear/Nose/Throat symptoms  Hematologic/Lymphatic: Negative for Hematologic/Lymphatic symptoms  Cardiovascular : Negative for cardiovascular symptoms  Respiratory : Negative for respiratory symptoms  Endocrine: Negative for endocrine symptoms  Musculoskeletal: Negative for musculoskeletal symptoms  Neurological: Negative for neurological symptoms  Psychologic: Negative for psychiatric symptoms

## 2020-05-29 NOTE — Patient Instructions (Signed)

## 2020-05-29 NOTE — Progress Notes (Signed)
05/29/2020 9:57 AM   Shela Leff [birth year] [id number]  Referring provider: Erven Colla, DO [place],  [place] [address]  followup nocturia and incomplete emptying  HPI: Mr Thomas Summers is a 65yo here for followup for incomplete emptying. He was started on rapaflo last visit and he is emptying better. PVR 21cc. He has nocturia 3-5x. He has urinary urgency and occasional urge incontinence. He has urinary frequency every 1-2 hours. He drinks a cup of coffee in the evening. He drinks 10-20oz of water within 2 hours of going to bed. IPSS 19 with QOL4   PMH: Past Medical History:  Diagnosis Date  . Allergy   . Depression   . Diabetes mellitus without complication ([place])   . Heart murmur   . Hyperlipidemia   . Hypertension     Surgical History: Past Surgical History:  Procedure Laterality Date  . COLONOSCOPY N/A 08/13/2015   Procedure: COLONOSCOPY;  Surgeon: Rogene Houston, MD;  Location: [facility];  Service: Endoscopy;  Laterality: N/A;  1200  . COLONOSCOPY  08/13/2015   [facility] - Dr.Rehman  . MENISCUS REPAIR Left 1997  . OTHER SURGICAL HISTORY     left ear surgery  . OTHER SURGICAL HISTORY     fracture arm  . TONSILLECTOMY      Home Medications:  Allergies as of 05/29/2020   No Known Allergies     Medication List       Accurate as of May 29, 2020  9:57 AM. If you have any questions, ask your nurse or doctor.        aspirin EC 81 MG tablet Take 81 mg by mouth daily. Swallow whole.   atorvastatin 20 MG tablet Commonly known as: LIPITOR Take one tablet po at bedtime   blood glucose meter kit and supplies Kit Dispense based on patient and insurance preference. ( One touch ) diabetes type 2. E11.9. Test blood sugar once a day   lisinopril 20 MG tablet Commonly known as: ZESTRIL Take 1 tablet (20 mg total) by mouth daily.   ONE TOUCH ULTRA TEST test strip Generic drug: glucose blood USE ONE STRIP TO CHECK GLUCOSE ONCE DAILY    glucose blood test strip Use one strip to check blood sugars once per day   OneTouch Delica Lancets 54O Misc USE ONE  TO CHECK GLUCOSE ONCE DAILY   sildenafil 20 MG tablet Commonly known as: REVATIO Take 1 or 2 tablets po 2 hours before sex   silodosin 8 MG Caps capsule Commonly known as: RAPAFLO Take 1 capsule (8 mg total) by mouth daily with breakfast.       Allergies: No Known Allergies  Family History: Family History  Problem Relation Age of Onset  . Hypertension Father   . Heart disease Father   . Healthy Mother   . Healthy Sister   . Healthy Brother   . Healthy Sister   . Healthy Brother   . Crohn's disease Daughter   . Migraines Daughter   . Healthy Daughter     Social History:  reports that he has never smoked. He has never used smokeless tobacco. He reports that he does not drink alcohol and does not use drugs.  ROS: All other review of systems were reviewed and are negative except what is noted above in HPI  Physical Exam: BP 130/71   Pulse 90   Temp 98.3 F (36.8 C)   Ht 6' (1.829 m)   Wt 215 lb (97.5  kg)   BMI 29.16 kg/m   Constitutional:  Alert and oriented, No acute distress. HEENT: [place] AT, moist mucus membranes.  Trachea midline, no masses. Cardiovascular: No clubbing, cyanosis, or edema. Respiratory: Normal respiratory effort, no increased work of breathing. GI: Abdomen is soft, nontender, nondistended, no abdominal masses GU: No CVA tenderness.  Lymph: No cervical or inguinal lymphadenopathy. Skin: No rashes, bruises or suspicious lesions. Neurologic: Grossly intact, no focal deficits, moving all 4 extremities. Psychiatric: Normal mood and affect.  Laboratory Data: Lab Results  Component Value Date   WBC 9.4 04/25/2018   HGB 16.3 04/25/2018   HCT 47.6 04/25/2018   MCV 90 04/25/2018   PLT 250 04/25/2018    Lab Results  Component Value Date   CREATININE 0.89 12/18/2019    Lab Results  Component Value Date   PSA 1.9 06/25/2014     No results found for: TESTOSTERONE  Lab Results  Component Value Date   HGBA1C 5.7 (H) 03/02/2020    Urinalysis    Component Value Date/Time   COLORURINE YELLOW 06/27/2015 2050   APPEARANCEUR Clear 05/29/2020 0930   LABSPEC 1.010 06/27/2015 2050   PHURINE 6.0 06/27/2015 2050   GLUCOSEU Negative 05/29/2020 0930   HGBUR SMALL (A) 06/27/2015 2050   BILIRUBINUR Negative 05/29/2020 0930   KETONESUR 15 (A) 06/27/2015 2050   PROTEINUR Negative 05/29/2020 0930   PROTEINUR NEGATIVE 06/27/2015 2050   NITRITE Negative 05/29/2020 0930   NITRITE NEGATIVE 06/27/2015 2050   LEUKOCYTESUR Negative 05/29/2020 0930    Lab Results  Component Value Date   LABMICR Comment 05/29/2020   WBCUA None seen 04/29/2020   LABEPIT 0-10 04/29/2020   BACTERIA Few (A) 04/29/2020    Pertinent Imaging:  No results found for this or any previous visit.  No results found for this or any previous visit.  No results found for this or any previous visit.  No results found for this or any previous visit.  No results found for this or any previous visit.  No results found for this or any previous visit.  No results found for this or any previous visit.  No results found for this or any previous visit.   Assessment & Plan:    1. Prostate hypertrophy -continue rapaflo 60m - Urinalysis, Routine w reflex microscopic - BLADDER SCAN AMB NON-IMAGING  2. Incomplete emptying of bladder -continue rapaflo 862m 3. Nocturia -mirabegron 2555maily   No follow-ups on file.  PatNicolette BangD  [facility] [place]

## 2020-06-01 ENCOUNTER — Encounter: Payer: Self-pay | Admitting: Family Medicine

## 2020-06-01 ENCOUNTER — Other Ambulatory Visit: Payer: Self-pay

## 2020-06-01 ENCOUNTER — Ambulatory Visit (INDEPENDENT_AMBULATORY_CARE_PROVIDER_SITE_OTHER): Payer: 59 | Admitting: Family Medicine

## 2020-06-01 VITALS — BP 134/72 | HR 75 | Temp 97.4°F | Ht 72.0 in | Wt 216.0 lb

## 2020-06-01 DIAGNOSIS — E785 Hyperlipidemia, unspecified: Secondary | ICD-10-CM | POA: Diagnosis not present

## 2020-06-01 DIAGNOSIS — Z0000 Encounter for general adult medical examination without abnormal findings: Secondary | ICD-10-CM | POA: Diagnosis not present

## 2020-06-01 DIAGNOSIS — E119 Type 2 diabetes mellitus without complications: Secondary | ICD-10-CM | POA: Diagnosis not present

## 2020-06-01 DIAGNOSIS — I10 Essential (primary) hypertension: Secondary | ICD-10-CM

## 2020-06-01 NOTE — Progress Notes (Signed)
Patient ID: Thomas Summers, male    DOB: [birth year], 65 y.o.   MRN: [id number]   Chief Complaint  Patient presents with  . Annual Exam   Subjective:    HPI The patient comes in today for a wellness visit.  A review of their health history was completed.  A review of medications was also completed.  Any needed refills; none at this time   Eating habits: healthy   Falls/  MVA accidents in past few months: none  Regular exercise: walks one mail 3 or more times a week  Specialist pt sees on regular basis: urology and GI  Preventative health issues were discussed.   Additional concerns: none  Liver enzymes are improving. And sine pt having good weight loss they improved.  Did some autoimmune and all were normal.  Seeing urology- pt is on silodosin to help with bph.  psa last time in 2021- normal. Just started mybetriq to help with bladder spasms. rapaflo - helping and not complete emptying.  Taking bp and hld meds and not side effects. Taking half of lipitor, if needed.   Medical History Thomas Summers has a past medical history of Allergy, Depression, Diabetes mellitus without complication ([place]), Heart murmur, Hyperlipidemia, and Hypertension.   Outpatient Encounter Medications as of 06/01/2020  Medication Sig  . aspirin EC 81 MG tablet Take 81 mg by mouth daily. Swallow whole.  Marland Kitchen atorvastatin (LIPITOR) 20 MG tablet Take one tablet po at bedtime  . blood glucose meter kit and supplies KIT Dispense based on patient and insurance preference. ( One touch ) diabetes type 2. E11.9. Test blood sugar once a day  . glucose blood test strip Use one strip to check blood sugars once per day  . lisinopril (ZESTRIL) 20 MG tablet Take 1 tablet (20 mg total) by mouth daily.  . mirabegron ER (MYRBETRIQ) 25 MG TB24 tablet Take 1 tablet (25 mg total) by mouth daily.  . ONE TOUCH ULTRA TEST test strip USE ONE STRIP TO CHECK GLUCOSE ONCE DAILY  . OneTouch Delica Lancets 70B MISC USE ONE   TO CHECK GLUCOSE ONCE DAILY  . sildenafil (REVATIO) 20 MG tablet Take 1 or 2 tablets po 2 hours before sex  . silodosin (RAPAFLO) 8 MG CAPS capsule Take 1 capsule (8 mg total) by mouth daily with breakfast.   No facility-administered encounter medications on file as of 06/01/2020.     Review of Systems  Constitutional: Negative for chills and fever.  HENT: Negative for congestion, rhinorrhea and sore throat.   Respiratory: Negative for cough, shortness of breath and wheezing.   Cardiovascular: Negative for chest pain and leg swelling.  Gastrointestinal: Negative for abdominal pain, diarrhea, nausea and vomiting.  Genitourinary: Negative for dysuria and frequency.  Skin: Negative for rash.  Neurological: Negative for dizziness, weakness and headaches.     Vitals BP 134/72   Pulse 75   Temp (!) 97.4 F (36.3 C)   Ht 6' (1.829 m)   Wt 216 lb (98 kg)   SpO2 98%   BMI 29.29 kg/m   Objective:   Physical Exam Vitals and nursing note reviewed.  Constitutional:      General: He is not in acute distress.    Appearance: Normal appearance. He is not ill-appearing.  HENT:     Head: Normocephalic.     Nose: Nose normal. No congestion.     Mouth/Throat:     Mouth: Mucous membranes are moist.     Pharynx:  No oropharyngeal exudate.  Eyes:     Extraocular Movements: Extraocular movements intact.     Conjunctiva/sclera: Conjunctivae normal.     Pupils: Pupils are equal, round, and reactive to light.  Cardiovascular:     Rate and Rhythm: Normal rate and regular rhythm.     Pulses: Normal pulses.     Heart sounds: Normal heart sounds. No murmur heard.   Pulmonary:     Effort: Pulmonary effort is normal.     Breath sounds: Normal breath sounds. No wheezing, rhonchi or rales.  Musculoskeletal:        General: Normal range of motion.     Right lower leg: No edema.     Left lower leg: No edema.  Skin:    General: Skin is warm and dry.     Findings: No rash.  Neurological:      General: No focal deficit present.     Mental Status: He is alert and oriented to person, place, and time.     Cranial Nerves: No cranial nerve deficit.  Psychiatric:        Mood and Affect: Mood normal.        Behavior: Behavior normal.        Thought Content: Thought content normal.        Judgment: Judgment normal.      Assessment and Plan   1. Well adult exam  2. Type 2 diabetes mellitus without complication, without long-term current use of insulin (HCC) - CMP14+EGFR - Hemoglobin A1c - Microalbumin, urine - CBC - Lipid panel  3. Essential hypertension - CMP14+EGFR - CBC  4. Hyperlipidemia, unspecified hyperlipidemia type   Pt stating not needing refills yet.  Needing eye doctor, pt stating is normal.  No retinopathy per pt. Diet modification for DM2. No cuts or ulcers or burning pain on feet.  Next visit at 65 yrs old, needs prevnar 21.  Then 1 yr later at 65 yrs old then gets pcv 13.   dm2- stable, last a1c at 5.7 in 11/21. Not taking meds. Diet modification.  htn- stable. Cont meds.  Watch salt in diet.  hld- need labs.  Cont meds.  Return in about 6 months (around 11/29/2020) for dm/htn.

## 2020-06-03 ENCOUNTER — Ambulatory Visit (INDEPENDENT_AMBULATORY_CARE_PROVIDER_SITE_OTHER): Payer: 59 | Admitting: Gastroenterology

## 2020-06-10 ENCOUNTER — Encounter: Payer: Self-pay | Admitting: Family Medicine

## 2020-07-01 ENCOUNTER — Other Ambulatory Visit: Payer: Self-pay

## 2020-07-01 DIAGNOSIS — R339 Retention of urine, unspecified: Secondary | ICD-10-CM

## 2020-07-01 MED ORDER — MIRABEGRON ER 25 MG PO TB24: 25.0000 mg | ORAL_TABLET | Freq: Every day | ORAL | 6 refills | Status: DC

## 2020-07-01 MED ORDER — MIRABEGRON ER 25 MG PO TB24: 25.0000 mg | ORAL_TABLET | Freq: Every day | ORAL | 0 refills | Status: DC

## 2020-07-01 NOTE — Progress Notes (Signed)
Pt called earlier asking for a prescription for Myrbetriq. Talked with Dr. Alyson Ingles and he said he put a prescription in with zero ordered so would know pt was on it. A prescription for  Myrbetriq 25 mgs was sent.Pt notified.

## 2020-07-28 ENCOUNTER — Encounter (INDEPENDENT_AMBULATORY_CARE_PROVIDER_SITE_OTHER): Payer: Self-pay | Admitting: *Deleted

## 2020-08-21 ENCOUNTER — Ambulatory Visit: Payer: 59 | Admitting: Urology

## 2020-09-15 ENCOUNTER — Ambulatory Visit: Payer: 59 | Admitting: Family Medicine

## 2020-09-23 ENCOUNTER — Other Ambulatory Visit: Payer: Self-pay | Admitting: Family Medicine

## 2020-10-14 ENCOUNTER — Telehealth: Payer: 59 | Admitting: Family

## 2020-10-14 ENCOUNTER — Encounter: Payer: Self-pay | Admitting: Family

## 2020-10-14 ENCOUNTER — Telehealth: Payer: Self-pay | Admitting: Family Medicine

## 2020-10-14 DIAGNOSIS — U071 COVID-19: Secondary | ICD-10-CM

## 2020-10-14 MED ORDER — FLUTICASONE PROPIONATE 50 MCG/ACT NA SUSP: 2.0000 | Freq: Every day | NASAL | 6 refills | Status: DC

## 2020-10-14 MED ORDER — MOLNUPIRAVIR EUA 200MG CAPSULE: 4.0000 | ORAL_CAPSULE | Freq: Two times a day (BID) | ORAL | 0 refills | Status: AC

## 2020-10-14 NOTE — Telephone Encounter (Signed)
Pt tested positive for covid yesterday 7/122022 symptoms- nasal drainage, cough. Pt is wondering if  Dr.Taylor could recommend what pt needs take medication wise

## 2020-10-14 NOTE — Telephone Encounter (Signed)
Pt has appt with telehealth at 11:30am, if pt has more question after the visit to let us know.   Thx.   Dr. Lovena Le

## 2020-10-14 NOTE — Telephone Encounter (Signed)
Patient notified and verbalized understanding- will call back if further questions after televisit

## 2020-10-14 NOTE — Progress Notes (Signed)
Virtual Visit Consent   Thomas Summers, you are scheduled for a virtual visit with a [facility] provider today.     Just as with appointments in the office, your consent must be obtained to participate.  Your consent will be active for this visit and any virtual visit you may have with one of our providers in the next 365 days.     If you have a MyChart account, a copy of this consent can be sent to you electronically.  All virtual visits are billed to your insurance company just like a traditional visit in the office.    As this is a virtual visit, video technology does not allow for your provider to perform a traditional examination.  This may limit your provider's ability to fully assess your condition.  If your provider identifies any concerns that need to be evaluated in person or the need to arrange testing (such as labs, EKG, etc.), we will make arrangements to do so.     Although advances in technology are sophisticated, we cannot ensure that it will always work on either your end or our end.  If the connection with a video visit is poor, the visit may have to be switched to a telephone visit.  With either a video or telephone visit, we are not always able to ensure that we have a secure connection.     I need to obtain your verbal consent now.   Are you willing to proceed with your visit today?    Thomas Summers has provided verbal consent on 10/14/2020 for a virtual visit (video or telephone).   Evelina Dun, FNP   Date: 10/14/2020 11:24 AM   Virtual Visit via Video Note   I, Evelina Dun, connected with  Thomas Summers  ([id number], 23-Apr-1955) on 10/14/20 at 11:30 AM EDT by a video-enabled telemedicine application and verified that I am speaking with the correct person using two identifiers.  Location: Patient: Virtual Visit Location Patient: Home Provider: Virtual Visit Location Provider: Home   I discussed the limitations of evaluation and management by  telemedicine and the availability of in person appointments. The patient expressed understanding and agreed to proceed.    History of Present Illness: Thomas Summers is a 65 y.o. who identifies as a male who was assigned male at birth, and is being seen today for COVID positive. He reports his symptoms started yesterday and tested positive. His wife was COVID positive earlier in the week.   HPI: Cough This is a new problem. The current episode started in the past 7 days. The problem has been unchanged. The cough is Non-productive. Associated symptoms include postnasal drip and rhinorrhea. Pertinent negatives include no chills, ear congestion, ear pain, fever, headaches, myalgias, nasal congestion, sore throat, shortness of breath or wheezing. Associated symptoms comments: Congestion . He has tried rest for the symptoms. The treatment provided mild relief.   Problems:  Patient Active Problem List   Diagnosis Date Noted   LFTs abnormal 05/06/2020   Incomplete emptying of bladder 04/29/2020   Fatty liver 03/13/2020   Pre-diabetes 12/13/2019   Lipid screening 12/13/2019   Erectile dysfunction 05/01/2016   Diabetic polyneuropathy ([place]) 04/12/2016   Prostate hypertrophy 10/07/2015   Type 2 diabetes mellitus without complication, without long-term current use of insulin ([place]) 06/17/2015   Essential hypertension 06/24/2014    Allergies: No Known Allergies Medications:  Current Outpatient Medications:    fluticasone (FLONASE) 50 MCG/ACT nasal spray, Place 2 sprays  into both nostrils daily., Disp: 16 g, Rfl: 6   molnupiravir EUA 200 mg CAPS, Take 4 capsules (800 mg total) by mouth 2 (two) times daily for 5 days., Disp: 40 capsule, Rfl: 0   aspirin EC 81 MG tablet, Take 81 mg by mouth daily. Swallow whole., Disp: , Rfl:    atorvastatin (LIPITOR) 20 MG tablet, TAKE 1 TABLET BY MOUTH AT BEDTIME, Disp: 90 tablet, Rfl: 0   blood glucose meter kit and supplies KIT, Dispense based on patient and  insurance preference. ( One touch ) diabetes type 2. E11.9. Test blood sugar once a day, Disp: 1 each, Rfl: 5   glucose blood test strip, Use one strip to check blood sugars once per day, Disp: 100 each, Rfl: 12   lisinopril (ZESTRIL) 20 MG tablet, Take 1 tablet (20 mg total) by mouth daily., Disp: 90 tablet, Rfl: 1   mirabegron ER (MYRBETRIQ) 25 MG TB24 tablet, Take 1 tablet (25 mg total) by mouth daily., Disp: 30 tablet, Rfl: 0   mirabegron ER (MYRBETRIQ) 25 MG TB24 tablet, Take 1 tablet (25 mg total) by mouth daily., Disp: 30 tablet, Rfl: 6   ONE TOUCH ULTRA TEST test strip, USE ONE STRIP TO CHECK GLUCOSE ONCE DAILY, Disp: 50 each, Rfl: 5   OneTouch Delica Lancets 29B MISC, USE ONE  TO CHECK GLUCOSE ONCE DAILY, Disp: 100 each, Rfl: 1   sildenafil (REVATIO) 20 MG tablet, Take 1 or 2 tablets po 2 hours before sex, Disp: 20 tablet, Rfl: 5   silodosin (RAPAFLO) 8 MG CAPS capsule, Take 1 capsule (8 mg total) by mouth daily with breakfast., Disp: 30 capsule, Rfl: 11  Observations/Objective: Patient is well-developed, well-nourished in no acute distress.  Resting comfortably at home.  Head is normocephalic, atraumatic.  No labored breathing. Speech is clear and coherent with logical content.  Patient is alert and oriented at baseline.  Nasal congestion   Assessment and Plan: 1. COVID - molnupiravir EUA 200 mg CAPS; Take 4 capsules (800 mg total) by mouth 2 (two) times daily for 5 days.  Dispense: 40 capsule; Refill: 0 - fluticasone (FLONASE) 50 MCG/ACT nasal spray; Place 2 sprays into both nostrils daily.  Dispense: 16 g; Refill: 6 COVID positive, rest, force fluids, tylenol as needed, Quarantine for at least 5 days and fever free, report any worsening symptoms such as increased shortness of breath, swelling, or continued high fevers.  Possible adverse effects discussed    Follow Up Instructions: I discussed the assessment and treatment plan with the patient. The patient was provided an  opportunity to ask questions and all were answered. The patient agreed with the plan and demonstrated an understanding of the instructions.  A copy of instructions were sent to the patient via MyChart.  The patient was advised to call back or seek an in-person evaluation if the symptoms worsen or if the condition fails to improve as anticipated.  Time:  I spent 7 minutes with the patient via telehealth technology discussing the above problems/concerns.    Evelina Dun, FNP

## 2020-10-30 ENCOUNTER — Telehealth: Payer: Self-pay | Admitting: Family Medicine

## 2020-10-30 NOTE — Telephone Encounter (Signed)
Sent mychart message

## 2020-10-30 NOTE — Telephone Encounter (Signed)
Please contact patient to have him schedule appt. Thank you

## 2020-11-09 ENCOUNTER — Ambulatory Visit: Payer: 59 | Admitting: Family Medicine

## 2020-11-09 ENCOUNTER — Other Ambulatory Visit: Payer: Self-pay

## 2020-11-09 ENCOUNTER — Encounter: Payer: Self-pay | Admitting: Family Medicine

## 2020-11-09 VITALS — BP 148/80 | HR 76 | Temp 97.4°F | Ht 72.0 in | Wt 219.0 lb

## 2020-11-09 DIAGNOSIS — E119 Type 2 diabetes mellitus without complications: Secondary | ICD-10-CM | POA: Diagnosis not present

## 2020-11-09 DIAGNOSIS — E785 Hyperlipidemia, unspecified: Secondary | ICD-10-CM | POA: Diagnosis not present

## 2020-11-09 DIAGNOSIS — I10 Essential (primary) hypertension: Secondary | ICD-10-CM | POA: Diagnosis not present

## 2020-11-09 MED ORDER — LISINOPRIL 10 MG PO TABS: ORAL_TABLET | ORAL | 1 refills | Status: DC

## 2020-11-09 NOTE — Progress Notes (Signed)
Patient ID: Thomas Summers, male    DOB: 03-26-1956, 65 y.o.   MRN: [id number]   Chief Complaint  Patient presents with   Diabetes   Hypertension   Subjective:    HPI H/o covid- Had covid 10/11/20. Mild symptoms and recovered well.  No fever, felt like a cold.  Had the covid vaccines 2x and booster.  Dm2- Compliant with medications.   Checking blood glucose.   Not seeing any high or low numbers.  Denies polyuria or polydipsia.  Eye exam: pt stating had eye exam last year. Per pt no retinopathy. Foot exam: no new concerns. Lab Results  Component Value Date   HGBA1C 5.9 (H) 11/10/2020   HTN Pt compliant with BP meds.  No SEs Denies chest pain, sob, LE swelling, or blurry vision.  Taking lisinopril 47m at night.  Medical History GFairleyhas a past medical history of Allergy, Depression, Diabetes [place] ([place], Heart murmur, Hyperlipidemia, and Hypertension.   Outpatient Encounter Medications as of 11/09/2020  Medication Sig   aspirin EC 81 MG tablet Take 81 mg by mouth daily. Swallow whole.   blood glucose meter kit and supplies KIT Dispense based on patient and insurance preference. ( One touch ) diabetes type 2. E11.9. Test blood sugar once a day   glucose blood test strip Use 1x per day as directed.   lisinopril (ZESTRIL) 10 MG tablet Take 1 tab p.o. qAM and then take 2 tab p.o. qPM   silodosin (RAPAFLO) 8 MG CAPS capsule Take 1 capsule (8 mg total) by mouth daily with breakfast.   [DISCONTINUED] atorvastatin (LIPITOR) 20 MG tablet TAKE 1 TABLET BY MOUTH AT BEDTIME   [DISCONTINUED] glucose blood test strip Use one strip to check blood sugars once per day   [DISCONTINUED] lisinopril (ZESTRIL) 20 MG tablet Take 1 tablet by mouth once daily   [DISCONTINUED] ONE TOUCH ULTRA TEST test strip USE ONE STRIP TO CHECK GLUCOSE ONCE DAILY   [DISCONTINUED] OneTouch Delica Lancets 347SMISC USE ONE  TO CHECK GLUCOSE ONCE DAILY   atorvastatin (LIPITOR) 20  MG tablet TAKE 1 TABLET BY MOUTH AT BEDTIME   OneTouch Delica Lancets 396GMISC USE ONE  TO CHECK GLUCOSE ONCE DAILY   [DISCONTINUED] fluticasone (FLONASE) 50 MCG/ACT nasal spray Place 2 sprays into both nostrils daily. (Patient not taking: Reported on 11/09/2020)   [DISCONTINUED] mirabegron ER (MYRBETRIQ) 25 MG TB24 tablet Take 1 tablet (25 mg total) by mouth daily.   [DISCONTINUED] mirabegron ER (MYRBETRIQ) 25 MG TB24 tablet Take 1 tablet (25 mg total) by mouth daily.   [DISCONTINUED] sildenafil (REVATIO) 20 MG tablet Take 1 or 2 tablets po 2 hours before sex (Patient not taking: Reported on 11/09/2020)   No facility-administered encounter medications on file as of 11/09/2020.     Review of Systems  Constitutional:  Negative for chills and fever.  HENT:  Negative for congestion, rhinorrhea and sore throat.   Respiratory:  Negative for cough, shortness of breath and wheezing.   Cardiovascular:  Negative for chest pain and leg swelling.  Gastrointestinal:  Negative for abdominal pain, diarrhea, nausea and vomiting.  Genitourinary:  Negative for dysuria and frequency.  Skin:  Negative for rash.  Neurological:  Negative for dizziness, weakness and headaches.    Vitals BP (!) 148/80   Pulse 76   Temp (!) 97.4 F (36.3 C)   Ht 6' (1.829 m)   Wt 219 lb (99.3 kg)   SpO2 98%   BMI 29.70  kg/m   Objective:   Physical Exam Vitals and nursing note reviewed.  Constitutional:      General: He is not in acute distress.    Appearance: Normal appearance. He is not ill-appearing.  HENT:     Head: Normocephalic.  Eyes:     Extraocular Movements: Extraocular movements intact.     Conjunctiva/sclera: Conjunctivae normal.     Pupils: Pupils are equal, round, and reactive to light.  Cardiovascular:     Rate and Rhythm: Normal rate and regular rhythm.     Pulses: Normal pulses.     Heart sounds: Normal heart sounds. No murmur heard. Pulmonary:     Effort: Pulmonary effort is normal.     Breath  sounds: Normal breath sounds. No wheezing, rhonchi or rales.  Musculoskeletal:        General: Normal range of motion.     Right lower leg: No edema.     Left lower leg: No edema.  Skin:    General: Skin is warm and dry.     Findings: No rash.  Neurological:     General: No focal deficit present.     Mental Status: He is alert and oriented to person, place, and time.     Cranial Nerves: No cranial nerve deficit.  Psychiatric:        Mood and Affect: Mood normal.        Behavior: Behavior normal.        Thought Content: Thought content normal.        Judgment: Judgment normal.     Assessment and Plan   1. Type 2 diabetes mellitus without complication, without long-term current use of insulin (HCC) - glucose blood test strip; Use 1x per day as directed.  Dispense: 100 each; Refill: 3 - OneTouch Delica Lancets 82N MISC; USE ONE  TO CHECK GLUCOSE ONCE DAILY  Dispense: 100 each; Refill: 1  2. Essential hypertension - lisinopril (ZESTRIL) 10 MG tablet; Take 1 tab p.o. qAM and then take 2 tab p.o. qPM  Dispense: 270 tablet; Refill: 1  3. Hyperlipidemia, unspecified hyperlipidemia type - atorvastatin (LIPITOR) 20 MG tablet; TAKE 1 TABLET BY MOUTH AT BEDTIME  Dispense: 90 tablet; Refill: 1   Htn- suboptimal.  Discussed dec salt in diet.  Will increase from 69m qhs lisinopril to 156min AM and 2088mn PM.  Dm2- a1c- improved to 5.9.  stable, controlled. Cont diet modification.  Hld- stable. Cont meds.   Pt in agreement.  BP Readings from Last 3 Encounters:  11/09/20 (!) 148/80  06/01/20 134/72  05/29/20 130/71      Return in about 6 months (around 05/12/2021) for f/u htn.

## 2020-11-11 LAB — CMP14+EGFR
ALT: 37 IU/L (ref 0–44)
AST: 24 IU/L (ref 0–40)
Albumin/Globulin Ratio: 2.2 (ref 1.2–2.2)
Albumin: 4.9 g/dL — ABNORMAL HIGH (ref 3.8–4.8)
Alkaline Phosphatase: 101 IU/L (ref 44–121)
BUN/Creatinine Ratio: 19 (ref 10–24)
BUN: 17 mg/dL (ref 8–27)
Bilirubin Total: 0.4 mg/dL (ref 0.0–1.2)
CO2: 20 mmol/L (ref 20–29)
Calcium: 9.1 mg/dL (ref 8.6–10.2)
Chloride: 103 mmol/L (ref 96–106)
Creatinine, Ser: 0.9 mg/dL (ref 0.76–1.27)
Globulin, Total: 2.2 g/dL (ref 1.5–4.5)
Glucose: 100 mg/dL — ABNORMAL HIGH (ref 65–99)
Potassium: 4.6 mmol/L (ref 3.5–5.2)
Sodium: 142 mmol/L (ref 134–144)
Total Protein: 7.1 g/dL (ref 6.0–8.5)
eGFR: 95 mL/min/{1.73_m2} (ref >59)

## 2020-11-11 LAB — LIPID PANEL
Chol/HDL Ratio: 2.4 ratio (ref 0.0–5.0)
Cholesterol, Total: 149 mg/dL (ref 100–199)
HDL: 63 mg/dL (ref >39)
LDL Chol Calc (NIH): 71 mg/dL (ref 0–99)
Triglycerides: 77 mg/dL (ref 0–149)
VLDL Cholesterol Cal: 15 mg/dL (ref 5–40)

## 2020-11-11 LAB — HEMOGLOBIN A1C
Est. average glucose Bld gHb Est-mCnc: 123 mg/dL
Hgb A1c MFr Bld: 5.9 % — ABNORMAL HIGH (ref 4.8–5.6)

## 2020-11-11 LAB — CBC
Hematocrit: 45.3 % (ref 37.5–51.0)
Hemoglobin: 15.1 g/dL (ref 13.0–17.7)
MCH: 30.3 pg (ref 26.6–33.0)
MCHC: 33.3 g/dL (ref 31.5–35.7)
MCV: 91 fL (ref 79–97)
Platelets: 205 10*3/uL (ref 150–450)
RBC: 4.98 x10E6/uL (ref 4.14–5.80)
RDW: 14.5 % (ref 11.6–15.4)
WBC: 8.7 10*3/uL (ref 3.4–10.8)

## 2020-11-11 LAB — MICROALBUMIN, URINE: Microalbumin, Urine: 15.2 ug/mL

## 2020-11-25 ENCOUNTER — Encounter (INDEPENDENT_AMBULATORY_CARE_PROVIDER_SITE_OTHER): Payer: Self-pay | Admitting: *Deleted

## 2020-11-25 NOTE — Telephone Encounter (Signed)
Cancel all prescription for Dr. Lovena Le patient has new provider

## 2020-11-27 DIAGNOSIS — E785 Hyperlipidemia, unspecified: Secondary | ICD-10-CM | POA: Insufficient documentation

## 2020-11-27 MED ORDER — ONETOUCH DELICA LANCETS 33G MISC: 1 refills | Status: AC

## 2020-11-27 MED ORDER — ATORVASTATIN CALCIUM 20 MG PO TABS: ORAL_TABLET | ORAL | 1 refills | Status: DC

## 2020-11-27 MED ORDER — GLUCOSE BLOOD VI STRP: ORAL_STRIP | 3 refills | Status: AC

## 2020-12-31 ENCOUNTER — Telehealth (INDEPENDENT_AMBULATORY_CARE_PROVIDER_SITE_OTHER): Payer: Self-pay

## 2020-12-31 ENCOUNTER — Encounter (INDEPENDENT_AMBULATORY_CARE_PROVIDER_SITE_OTHER): Payer: Self-pay

## 2020-12-31 ENCOUNTER — Other Ambulatory Visit (INDEPENDENT_AMBULATORY_CARE_PROVIDER_SITE_OTHER): Payer: Self-pay

## 2020-12-31 DIAGNOSIS — Z86010 Personal history of colonic polyps: Secondary | ICD-10-CM

## 2020-12-31 MED ORDER — PEG 3350-KCL-NA BICARB-NACL 420 G PO SOLR: 4000.0000 mL | ORAL | 0 refills | Status: DC

## 2020-12-31 NOTE — Telephone Encounter (Signed)
Thomas Summers, CMA  

## 2020-12-31 NOTE — Telephone Encounter (Signed)
Referring MD/PCP: Rubin Payor  Procedure: Tcs  Reason/Indication:  History of Polyps  Has patient had this procedure before?  yes  If so, when, by whom and where?  08/2015  Is there a family history of colon cancer?  no  Who?  What age when diagnosed?    Is patient diabetic? If yes, Type 1 or Type 2   yes type 2      Does patient have prosthetic heart valve or mechanical valve?  no  Do you have a pacemaker/defibrillator?  no  Has patient ever had endocarditis/atrial fibrillation? no  Does patient use oxygen? no  Has patient had joint replacement within last 12 months?  no  Is patient constipated or do they take laxatives? no  Does patient have a history of alcohol/drug use?  no  Have you had a stroke/heart attack last 6 mths? no  Do you take medicine for weight loss?  no  For male patients,: do you still have your menstrual cycle? N/A  Is patient on blood thinner such as Coumadin, Plavix and/or Aspirin? no  Medications: atorvastatin 20 mg daily, lisinopril 20 mg daily, sildenafil 20 mg prn, silodosin 8mg  daily  Allergies: nkda  Medication Adjustment per Dr Laural Golden none  Procedure date & time: 01/14/21 7:30

## 2021-01-13 ENCOUNTER — Other Ambulatory Visit (INDEPENDENT_AMBULATORY_CARE_PROVIDER_SITE_OTHER): Payer: Self-pay

## 2021-01-14 ENCOUNTER — Other Ambulatory Visit: Payer: Self-pay

## 2021-01-14 ENCOUNTER — Ambulatory Visit (HOSPITAL_COMMUNITY)
Admission: RE | Admit: 2021-01-14 | Discharge: 2021-01-14 | Disposition: A | Discharge status: Home / Self Care | Payer: Medicare Other | Attending: Internal Medicine | Admitting: Internal Medicine

## 2021-01-14 ENCOUNTER — Encounter (HOSPITAL_COMMUNITY): Payer: Self-pay | Admitting: Internal Medicine

## 2021-01-14 ENCOUNTER — Encounter (HOSPITAL_COMMUNITY)
Admission: RE | Disposition: A | Discharge status: Home / Self Care | Payer: Self-pay | Source: Home / Self Care | Attending: Internal Medicine

## 2021-01-14 DIAGNOSIS — K644 Residual hemorrhoidal skin tags: Secondary | ICD-10-CM

## 2021-01-14 DIAGNOSIS — Z79899 Other long term (current) drug therapy: Secondary | ICD-10-CM | POA: Insufficient documentation

## 2021-01-14 DIAGNOSIS — Z09 Encounter for follow-up examination after completed treatment for conditions other than malignant neoplasm: Secondary | ICD-10-CM | POA: Diagnosis not present

## 2021-01-14 DIAGNOSIS — Z86010 Personal history of colonic polyps: Secondary | ICD-10-CM

## 2021-01-14 DIAGNOSIS — K6389 Other specified diseases of intestine: Secondary | ICD-10-CM | POA: Diagnosis not present

## 2021-01-14 DIAGNOSIS — D123 Benign neoplasm of transverse colon: Secondary | ICD-10-CM | POA: Insufficient documentation

## 2021-01-14 DIAGNOSIS — Z1211 Encounter for screening for malignant neoplasm of colon: Secondary | ICD-10-CM | POA: Insufficient documentation

## 2021-01-14 DIAGNOSIS — D125 Benign neoplasm of sigmoid colon: Secondary | ICD-10-CM | POA: Insufficient documentation

## 2021-01-14 DIAGNOSIS — Z8379 Family history of other diseases of the digestive system: Secondary | ICD-10-CM | POA: Insufficient documentation

## 2021-01-14 HISTORY — PX: POLYPECTOMY: SHX5525

## 2021-01-14 HISTORY — PX: COLONOSCOPY: SHX5424

## 2021-01-14 LAB — GLUCOSE, CAPILLARY: Glucose-Capillary: 90 mg/dL (ref 70–99)

## 2021-01-14 LAB — HM COLONOSCOPY

## 2021-01-14 SURGERY — COLONOSCOPY
Anesthesia: Moderate Sedation

## 2021-01-14 MED ORDER — MEPERIDINE HCL 50 MG/ML IJ SOLN
INTRAMUSCULAR | Status: DC | PRN
  Administered 2021-01-14 (×2): 25 mg via INTRAVENOUS

## 2021-01-14 MED ORDER — SODIUM CHLORIDE 0.9 % IV SOLN: INTRAVENOUS | Status: DC

## 2021-01-14 MED ORDER — STERILE WATER FOR IRRIGATION IR SOLN
Status: DC | PRN
  Administered 2021-01-14: 1.5 mL

## 2021-01-14 MED ORDER — MEPERIDINE HCL 50 MG/ML IJ SOLN
INTRAMUSCULAR | Status: AC
  Filled 2021-01-14: qty 1

## 2021-01-14 MED ORDER — MIDAZOLAM HCL 5 MG/5ML IJ SOLN
INTRAMUSCULAR | Status: AC
  Filled 2021-01-14: qty 10

## 2021-01-14 MED ORDER — MIDAZOLAM HCL 5 MG/5ML IJ SOLN
INTRAMUSCULAR | Status: DC | PRN
  Administered 2021-01-14 (×4): 2 mg via INTRAVENOUS

## 2021-01-14 NOTE — H&P (Signed)
Thomas Summers is an 65 y.o. male.   Chief Complaint: Patient is here for colonoscopy HPI: Patient is 65 year old Caucasian male who has a history of colonic adenomas and is here for surveillance colonoscopy.  His last colonoscopy was in May 2017 with removal of 3 polyps and 2 were tubular adenomas.  He denies abdominal pain change in bowel habits or rectal bleeding. He does not take aspirin anymore. Family history is negative for CRC.  Past Medical History:  Diagnosis Date   Allergy    Depression    Diabetes mellitus without complication ([place])    Heart murmur    Hyperlipidemia    Hypertension     Past Surgical History:  Procedure Laterality Date   COLONOSCOPY N/A 08/13/2015   Procedure: COLONOSCOPY;  Surgeon: Rogene Houston, MD;  [facility];  Service: Endoscopy;  Laterality: N/A;  1200   COLONOSCOPY  08/13/2015   [facility] - Dr.Mariann Palo   MENISCUS REPAIR Left 1997   OTHER SURGICAL HISTORY     left ear surgery   OTHER SURGICAL HISTORY     fracture arm   TONSILLECTOMY      Family History  Problem Relation Age of Onset   Hypertension Father    Heart disease Father    Healthy Mother    Healthy Sister    Healthy Brother    Healthy Sister    Healthy Brother    Crohn's disease Daughter    Migraines Daughter    Healthy Daughter    Social History:  reports that he has never smoked. He has never used smokeless tobacco. He reports that he does not drink alcohol and does not use drugs.  Allergies: No Known Allergies  Medications Prior to Admission  Medication Sig Dispense Refill   atorvastatin (LIPITOR) 20 MG tablet TAKE 1 TABLET BY MOUTH AT BEDTIME 90 tablet 1   lisinopril (ZESTRIL) 10 MG tablet Take 1 tab p.o. qAM and then take 2 tab p.o. qPM (Patient taking differently: Take 10-20 mg by mouth See admin instructions. 10 mg in the morning, 20 mg at bedtime) 270 tablet 1   polyethylene glycol-electrolytes (TRILYTE) 420 g solution Take 4,000 mLs by mouth as directed.  4000 mL 0   silodosin (RAPAFLO) 8 MG CAPS capsule Take 1 capsule (8 mg total) by mouth daily with breakfast. 30 capsule 11   blood glucose meter kit and supplies KIT Dispense based on patient and insurance preference. ( One touch ) diabetes type 2. E11.9. Test blood sugar once a day 1 each 5   glucose blood test strip Use 1x per day as directed. 100 each 3   OneTouch Delica Lancets 70J MISC USE ONE  TO CHECK GLUCOSE ONCE DAILY 100 each 1    Results for orders placed or performed during the hospital encounter of 01/14/21 (from the past 48 hour(s))  Glucose, capillary     Status: None   Collection Time: 01/14/21  6:55 AM  Result Value Ref Range   Glucose-Capillary 90 70 - 99 mg/dL    Comment: Glucose reference range applies only to samples taken after fasting for at least 8 hours.   No results found.  Review of Systems  Blood pressure (!) 155/98, pulse (!) 102, temperature 97.9 F (36.6 C), temperature source Oral, resp. rate 17, height '6\' 1"'  (1.854 m), weight 97.5 kg. Physical Exam Eyes:     General: No scleral icterus.    Conjunctiva/sclera: Conjunctivae normal.     Pupils: Pupils are equal,  round, and reactive to light.  Cardiovascular:     Rate and Rhythm: Normal rate and regular rhythm.     Heart sounds: Normal heart sounds. No murmur heard. Pulmonary:     Effort: Pulmonary effort is normal.     Breath sounds: Normal breath sounds.  Abdominal:     General: There is no distension.     Palpations: Abdomen is soft. There is no mass.     Tenderness: There is no abdominal tenderness.  Musculoskeletal:        General: No swelling.     Cervical back: Neck supple.  Lymphadenopathy:     Cervical: No cervical adenopathy.  Skin:    General: Skin is warm and dry.  Neurological:     Mental Status: He is alert.     Assessment/Plan  History of colonic polyps Surveillance colonoscopy  Hildred Laser, MD 01/14/2021, 7:30 AM

## 2021-01-14 NOTE — Op Note (Signed)
[facility] Patient Name: Thomas Summers Procedure Date: 01/14/2021 7:07 AM MRN: [id number] Date of Birth: October 25, 1955 Attending MD: Hildred Laser , MD CSN: [id number] Age: 65 Admit Type: Outpatient Procedure:                Colonoscopy Indications:              High risk colon cancer surveillance: Personal                            history of colonic polyps Providers:                Hildred Laser, MD, Charlsie Quest. Insurance claims handler, Therapist, sports,                            Suzan Garibaldi. Risa Grill, Technician Referring MD:             Rubin Payor, FNP Medicines:                Meperidine 50 mg IV, Midazolam 8 mg IV Complications:            No immediate complications. Estimated Blood Loss:     Estimated blood loss was minimal. Procedure:                Pre-Anesthesia Assessment:                           - Prior to the procedure, a History and Physical                            was performed, and patient medications and                            allergies were reviewed. The patient's tolerance of                            previous anesthesia was also reviewed. The risks                            and benefits of the procedure and the sedation                            options and risks were discussed with the patient.                            All questions were answered, and informed consent                            was obtained. Prior Anticoagulants: The patient has                            taken no previous anticoagulant or antiplatelet                            agents. ASA Grade Assessment: II - A patient with  mild systemic disease. After reviewing the risks                            and benefits, the patient was deemed in                            satisfactory condition to undergo the procedure.                           After obtaining informed consent, the colonoscope                            was passed under direct vision. Throughout the                             procedure, the patient's blood pressure, pulse, and                            oxygen saturations were monitored continuously. The                            [id number]-HQ190L ([id number]) scope was introduced through                            the anus and advanced to the the cecum, identified                            by appendiceal orifice and ileocecal valve. The                            colonoscopy was somewhat difficult due to a                            redundant sigmoid colon. The patient tolerated the                            procedure well. The quality of the bowel                            preparation was adequate. The ileocecal valve,                            appendiceal orifice, and rectum were photographed. Scope In: 7:43:08 AM Scope Out: 8:12:16 AM Scope Withdrawal Time: 0 hours 14 minutes 8 seconds  Total Procedure Duration: 0 hours 29 minutes 8 seconds  Findings:      The perianal and digital rectal examinations were normal.      An area of congested mucosa was found at the appendiceal orifice.      Three flat polyps were found in the proximal sigmoid colon and       transverse colon. The polyps were small in size. These polyps were       removed with a cold snare. Resection and retrieval were complete. The       pathology specimen was placed into Bottle Number  1.      A small polyp was found in the proximal sigmoid colon. Biopsies were       taken with a cold forceps for histology. The pathology specimen was       placed into Bottle Number 1.      External hemorrhoids were found during retroflexion. The hemorrhoids       were small. Impression:               - Congested mucosa at the appendiceal orifice. No                            submucosa lesion seen as on last exam.                           - Three small polyps in the proximal sigmoid colon                            and in the transverse colon, removed with a cold                             snare. Resected and retrieved.                           - One small polyp in the proximal sigmoid colon.                            Biopsied.                           - External hemorrhoids. Moderate Sedation:      Moderate (conscious) sedation was administered by the endoscopy nurse       and supervised by the endoscopist. The following parameters were       monitored: oxygen saturation, heart rate, blood pressure, CO2       capnography and response to care. Total physician intraservice time was       34 minutes. Recommendation:           - Patient has a contact number available for                            emergencies. The signs and symptoms of potential                            delayed complications were discussed with the                            patient. Return to normal activities tomorrow.                            Written discharge instructions were provided to the                            patient.                           - Resume previous diet today.                           -  Continue present medications.                           - No aspirin, ibuprofen, naproxen, or other                            non-steroidal anti-inflammatory drugs for 1 day.                           - Await pathology results.                           - Repeat colonoscopy in 5 years for surveillance. Procedure Code(s):        --- Professional ---                           [phone], Colonoscopy, flexible; with removal of                            tumor(s), polyp(s), or other lesion(s) by snare                            technique                           45380, 59, Colonoscopy, flexible; with biopsy,                            single or multiple                           99153, Moderate sedation; each additional 15                            minutes intraservice time                           G0500, Moderate sedation services provided by the                            same physician or  other qualified health care                            professional performing a gastrointestinal                            endoscopic service that sedation supports,                            requiring the presence of an independent trained                            observer to assist in the monitoring of the                            patient's level of consciousness and physiological  status; initial 15 minutes of intra-service time;                            patient age 20 years or older (additional time may                            be reported with [phone], as appropriate) Diagnosis Code(s):        --- Professional ---                           K63.5, Polyp of colon                           Z86.010, Personal history of colonic polyps                           K63.89, Other specified diseases of intestine                           K64.4, Residual hemorrhoidal skin tags CPT copyright 2019 [employer]. All rights reserved. The codes documented in this report are preliminary and upon coder review may  be revised to meet current compliance requirements. Hildred Laser, MD Hildred Laser, MD 01/14/2021 8:29:03 AM This report has been signed electronically. Number of Addenda: 0

## 2021-01-14 NOTE — Discharge Instructions (Addendum)
No aspirin or NSAIDs for 24 hours Resume usual medications and diet as before. No driving for 24 hours. Physician will call with biopsy results. 

## 2021-01-18 LAB — SURGICAL PATHOLOGY

## 2021-01-19 ENCOUNTER — Encounter (HOSPITAL_COMMUNITY): Payer: Self-pay | Admitting: Internal Medicine

## 2021-01-28 ENCOUNTER — Encounter (INDEPENDENT_AMBULATORY_CARE_PROVIDER_SITE_OTHER): Payer: Self-pay | Admitting: *Deleted

## 2021-09-07 ENCOUNTER — Other Ambulatory Visit: Payer: Self-pay | Admitting: Urology

## 2021-09-07 ENCOUNTER — Other Ambulatory Visit: Payer: Self-pay | Admitting: Family Medicine

## 2021-09-07 DIAGNOSIS — I10 Essential (primary) hypertension: Secondary | ICD-10-CM

## 2021-09-08 ENCOUNTER — Ambulatory Visit
Admission: EM | Admit: 2021-09-08 | Discharge: 2021-09-08 | Disposition: A | Discharge status: Home / Self Care | Payer: Medicare Other | Attending: Family Medicine | Admitting: Family Medicine

## 2021-09-08 ENCOUNTER — Encounter: Payer: Self-pay | Admitting: Emergency Medicine

## 2021-09-08 ENCOUNTER — Ambulatory Visit: Payer: Self-pay

## 2021-09-08 DIAGNOSIS — S80861A Insect bite (nonvenomous), right lower leg, initial encounter: Secondary | ICD-10-CM

## 2021-09-08 DIAGNOSIS — W57XXXA Bitten or stung by nonvenomous insect and other nonvenomous arthropods, initial encounter: Secondary | ICD-10-CM | POA: Diagnosis not present

## 2021-09-08 DIAGNOSIS — L989 Disorder of the skin and subcutaneous tissue, unspecified: Secondary | ICD-10-CM

## 2021-09-08 MED ORDER — TRIAMCINOLONE ACETONIDE 0.1 % EX CREA: 1.0000 "application " | TOPICAL_CREAM | Freq: Two times a day (BID) | CUTANEOUS | 0 refills | Status: DC

## 2021-09-08 NOTE — ED Triage Notes (Signed)
Tick bite on back of right leg.  Removed tick on Sunday.  Area red and itchy.

## 2021-09-08 NOTE — ED Provider Notes (Signed)
[facility]    CSN: [id number] Arrival date & time: 09/08/21  1138      History   Chief Complaint No chief complaint on file.   HPI Thomas Summers is a 66 y.o. male.   Presenting today with itchy red rash at the site of a tick bite that he discovered 3 days ago.  He remove the tick fully at that time.  Denies any drainage, fevers, chills, swelling to the area, leg weakness numbness tingling, headache, diffuse rashes.  Tried some hydrocortisone with mild relief of the itching.   Past Medical History:  Diagnosis Date   Allergy    Depression    Diabetes mellitus without complication ([place])    Heart murmur    Hyperlipidemia    Hypertension     Patient Active Problem List   Diagnosis Date Noted   Hyperlipidemia 11/27/2020   LFTs abnormal 05/06/2020   Incomplete emptying of bladder 04/29/2020   Fatty liver 03/13/2020   Pre-diabetes 12/13/2019   Lipid screening 12/13/2019   Erectile dysfunction 05/01/2016   Diabetic polyneuropathy ([place]) 04/12/2016   Prostate hypertrophy 10/07/2015   Type 2 diabetes mellitus without complication, without long-term current use of insulin ([place]) 06/17/2015   Essential hypertension 06/24/2014    Past Surgical History:  Procedure Laterality Date   COLONOSCOPY N/A 08/13/2015   Procedure: COLONOSCOPY;  Surgeon: Rogene Houston, MD;  [facility];  Service: Endoscopy;  Laterality: N/A;  1200   COLONOSCOPY  08/13/2015   [facility] - Dr.Rehman   COLONOSCOPY N/A 01/14/2021   Procedure: COLONOSCOPY;  Surgeon: Rogene Houston, MD;  [facility];  Service: Endoscopy;  Laterality: N/A;  7:30   MENISCUS REPAIR Left 1997   OTHER SURGICAL HISTORY     left ear surgery   OTHER SURGICAL HISTORY     fracture arm   POLYPECTOMY  01/14/2021   Procedure: POLYPECTOMY;  Surgeon: Rogene Houston, MD;  Location: [facility];  Service: Endoscopy;;   TONSILLECTOMY         Home Medications    Prior to Admission medications    Medication Sig Start Date End Date Taking? Authorizing Provider  triamcinolone cream (KENALOG) 0.1 % Apply 1 application. topically 2 (two) times daily. 09/08/21  Yes Volney American, PA-C  atorvastatin (LIPITOR) 20 MG tablet TAKE 1 TABLET BY MOUTH AT BEDTIME 11/27/20   Lovena Le, Malena M, DO  blood glucose meter kit and supplies KIT Dispense based on patient and insurance preference. ( One touch ) diabetes type 2. E11.9. Test blood sugar once a day 04/08/16   Mikey Kirschner, MD  glucose blood test strip Use 1x per day as directed. 11/27/20   Lovena Le, Malena M, DO  lisinopril (ZESTRIL) 10 MG tablet Take 1 tab p.o. qAM and then take 2 tab p.o. qPM Patient taking differently: Take 10-20 mg by mouth See admin instructions. 10 mg in the morning, 20 mg at bedtime 11/09/20   Lovena Le, Malena M, DO  OneTouch Delica Lancets 77A MISC USE ONE  TO CHECK GLUCOSE ONCE DAILY 11/27/20   Elvia Collum M, DO  polyethylene glycol-electrolytes (TRILYTE) 420 g solution Take 4,000 mLs by mouth as directed. 12/31/20   Rogene Houston, MD  silodosin (RAPAFLO) 8 MG CAPS capsule TAKE 1 CAPSULE BY MOUTH ONCE DAILY WITH BREAKFAST 09/08/21   McKenzie, Candee Furbish, MD    Family History Family History  Problem Relation Age of Onset   Hypertension Father    Heart  disease Father    Healthy Mother    Healthy Sister    Healthy Brother    Healthy Sister    Healthy Brother    Crohn's disease Daughter    Migraines Daughter    Healthy Daughter     Social History Social History   Tobacco Use   Smoking status: Never   Smokeless tobacco: Never  Substance Use Topics   Alcohol use: No   Drug use: No     Allergies   Patient has no known allergies.   Review of Systems Review of Systems Per HPI  Physical Exam Triage Vital Signs ED Triage Vitals  Enc Vitals Group     BP 09/08/21 1144 138/78     Pulse Rate 09/08/21 1144 79     Resp 09/08/21 1144 18     Temp 09/08/21 1144 97.9 F (36.6 C)     Temp Source 09/08/21  1144 Oral     SpO2 09/08/21 1144 97 %     Weight --      Height --      Head Circumference --      Peak Flow --      Pain Score 09/08/21 1145 0     Pain Loc --      Pain Edu? --      Excl. in [place]? --    No data found.  Updated Vital Signs BP 138/78 (BP Location: Right Arm)   Pulse 79   Temp 97.9 F (36.6 C) (Oral)   Resp 18   SpO2 97%   Visual Acuity Right Eye Distance:   Left Eye Distance:   Bilateral Distance:    Right Eye Near:   Left Eye Near:    Bilateral Near:     Physical Exam Vitals and nursing note reviewed.  Constitutional:      Appearance: Normal appearance.  HENT:     Head: Atraumatic.  Eyes:     Extraocular Movements: Extraocular movements intact.     Conjunctiva/sclera: Conjunctivae normal.  Cardiovascular:     Rate and Rhythm: Normal rate and regular rhythm.  Pulmonary:     Effort: Pulmonary effort is normal.     Breath sounds: Normal breath sounds.  Musculoskeletal:        General: Normal range of motion.     Cervical back: Normal range of motion and neck supple.  Skin:    General: Skin is warm.     Comments: Small somewhat circular area of erythema surrounding tick bite posterior right knee.  No induration, fluctuance, edema to the area.  Neurological:     General: No focal deficit present.     Mental Status: He is oriented to person, place, and time.  Psychiatric:        Mood and Affect: Mood normal.        Thought Content: Thought content normal.        Judgment: Judgment normal.     UC Treatments / Results  Labs (all labs ordered are listed, but only abnormal results are displayed) Labs Reviewed - No data to display  EKG   Radiology No results found.  Procedures Procedures (including critical care time)  Medications Ordered in UC Medications - No data to display  Initial Impression / Assessment and Plan / UC Course  I have reviewed the triage vital signs and the nursing notes.  Pertinent labs & imaging results that  were available during my care of the patient were reviewed by me and  considered in my medical decision making (see chart for details).     Consistent with localized site reaction, treat with triamcinolone cream, ice, elevation.  Return for worsening symptoms.  Final Clinical Impressions(s) / UC Diagnoses   Final diagnoses:  Skin lesion  Tick bite of right lower leg, initial encounter   Discharge Instructions   None    ED Prescriptions     Medication Sig Dispense Auth. Provider   triamcinolone cream (KENALOG) 0.1 % Apply 1 application. topically 2 (two) times daily. 60 g Volney American, [place]      PDMP not reviewed this encounter.   Volney American, [place] 09/08/21 1221

## 2023-05-03 NOTE — Progress Notes (Signed)
Updated medications

## 2023-05-04 ENCOUNTER — Encounter: Payer: Self-pay | Admitting: Internal Medicine

## 2023-05-04 ENCOUNTER — Ambulatory Visit: Payer: Medicare Other | Attending: Internal Medicine | Admitting: Internal Medicine

## 2023-05-04 VITALS — BP 146/86 | HR 43 | Ht 73.0 in | Wt 223.0 lb

## 2023-05-04 DIAGNOSIS — I493 Ventricular premature depolarization: Secondary | ICD-10-CM | POA: Diagnosis not present

## 2023-05-04 DIAGNOSIS — Z136 Encounter for screening for cardiovascular disorders: Secondary | ICD-10-CM

## 2023-05-04 DIAGNOSIS — R7989 Other specified abnormal findings of blood chemistry: Secondary | ICD-10-CM | POA: Diagnosis not present

## 2023-05-04 NOTE — Patient Instructions (Signed)

## 2023-05-04 NOTE — Progress Notes (Signed)
Cardiology Office Note  Date: 05/04/2023   ID: [name], [name] [birth year], MRN [id number]  PCP:  Shelby Dubin, FNP  Cardiologist:  Marjo Bicker, MD Electrophysiologist:  None   History of Present Illness: Thomas Summers is a 68 y.o. male known to have HTN, DM 2, HLD was referred to cardiology clinic for elevated proBNP.  Patient had hypertensive urgency prompting ER visit at [facility] in 03/2023.  He had elevated proBNP level, 171 at the time.  He did not have any symptoms of DOE, orthopnea, PND.  No angina.  No dizziness, palpitations, syncope or leg swelling.  No prior history of ischemia evaluation.  No prior history of MI/PCI/CABG.  Past Medical History:  Diagnosis Date   Allergy    Depression    Diabetes mellitus without complication (HCC)    Heart murmur    Hyperlipidemia    Hypertension     Past Surgical History:  Procedure Laterality Date   COLONOSCOPY N/A 08/13/2015   Procedure: COLONOSCOPY;  Surgeon: Malissa Hippo, MD;  [facility];  Service: Endoscopy;  Laterality: N/A;  1200   COLONOSCOPY  08/13/2015   [facility] - Dr.Rehman   COLONOSCOPY N/A 01/14/2021   Procedure: COLONOSCOPY;  Surgeon: Malissa Hippo, MD;  [facility];  Service: Endoscopy;  Laterality: N/A;  7:30   MENISCUS REPAIR Left 1997   OTHER SURGICAL HISTORY     left ear surgery   OTHER SURGICAL HISTORY     fracture arm   POLYPECTOMY  01/14/2021   Procedure: POLYPECTOMY;  Surgeon: Malissa Hippo, MD;  [facility];  Service: Endoscopy;;   TONSILLECTOMY      Current Outpatient Medications  Medication Sig Dispense Refill   atorvastatin (LIPITOR) 20 MG tablet TAKE 1 TABLET BY MOUTH AT BEDTIME 90 tablet 1   blood glucose meter kit and supplies KIT Dispense based on patient and insurance preference. ( One touch ) diabetes type 2. E11.9. Test blood sugar once a day 1 each 5   fluticasone (FLONASE) 50 MCG/ACT nasal spray Place 1 spray into both  nostrils daily as needed.     glucose blood test strip Use 1x per day as directed. 100 each 3   lisinopril (ZESTRIL) 40 MG tablet Take 40 mg by mouth daily.     metFORMIN (GLUCOPHAGE) 500 MG tablet Take 500 mg by mouth daily.     OneTouch Delica Lancets 33G MISC USE ONE  TO CHECK GLUCOSE ONCE DAILY 100 each 1   silodosin (RAPAFLO) 8 MG CAPS capsule TAKE 1 CAPSULE BY MOUTH ONCE DAILY WITH BREAKFAST 30 capsule 0   No current facility-administered medications for this visit.   Allergies:  Patient has no known allergies.   Social History: The patient  reports that he has never smoked. He has never used smokeless tobacco. He reports that he does not drink alcohol and does not use drugs.   Family History: The patient's family history includes Crohn's disease in his daughter; Healthy in his brother, brother, daughter, mother, sister, and sister; Heart disease in his father; Hypertension in his father; Migraines in his daughter.   ROS:  Please see the history of present illness. Otherwise, complete review of systems is positive for none  All other systems are reviewed and negative.   Physical Exam: VS:  BP (!) 146/86 (BP Location: Right Arm, Cuff Size: Normal) Comment: recheck - whitecoat  Pulse (!) 43   Ht 6\' 1"  (1.854 m)  Wt 223 lb (101.2 kg)   SpO2 96%   BMI 29.42 kg/m , BMI Body mass index is 29.42 kg/m.  Wt Readings from Last 3 Encounters:  05/04/23 223 lb (101.2 kg)  01/14/21 215 lb (97.5 kg)  11/09/20 219 lb (99.3 kg)    General: Patient appears comfortable at rest. HEENT: Conjunctiva and lids normal, oropharynx clear with moist mucosa. Neck: Supple, no elevated JVP or carotid bruits, no thyromegaly. Lungs: Clear to auscultation, nonlabored breathing at rest. Cardiac: Regular rate and rhythm, no S3 or significant systolic murmur, no pericardial rub. Abdomen: Soft, nontender, no hepatomegaly, bowel sounds present, no guarding or rebound. Extremities: No pitting edema, distal  pulses 2+. Skin: Warm and dry. Musculoskeletal: No kyphosis. Neuropsychiatric: Alert and oriented x3, affect grossly appropriate.  Recent Labwork: No results found for requested labs within last 365 days.     Component Value Date/Time   CHOL 149 11/10/2020 0806   TRIG 77 11/10/2020 0806   HDL 63 11/10/2020 0806   CHOLHDL 2.4 11/10/2020 0806   LDLCALC 71 11/10/2020 0806    Other Studies Reviewed Today:   Assessment and Plan:  Elevated Pro-BNP in the setting of hypertensive urgency in December 2024: Patient had elevated blood pressure prompting [facility] ER visit in 03/2023.  He had elevated proBNP, 171, mildly elevated at that time.  Since then, he had no symptoms of DOE, orthopnea, PND, leg swelling or angina.  Elevated proBNP likely secondary to hypertensive urgency.  Management of HTN per PCP.  No further cardiac workup is indicated at this time.  Frequent PVCs: EKG today showed normal sinus rhythm, PVCs.  Per patient, prior EKGs also show the same.  He denies having any symptoms of palpitations or dizziness.  He is aware to reach out to our clinic if he develops any new symptoms of palpitations or dizziness in the future so we can mail 2-week event monitor at that time.  HTN, controlled: Continue lisinopril 40 mg once daily, follows with PCP.  He was started on amlodipine by [facility] ER which was discontinued later on.  Goal BP less than 130/80 mmHg.  Follow-up with PCP.  DM 2, on metformin: Follows with PCP.  HTN, unknown values: Continue atorvastatin 20 mg nightly.  Goal LDL less than 100.       Medication Adjustments/Labs and Tests Ordered: Current medicines are reviewed at length with the patient today.  Concerns regarding medicines are outlined above.    Disposition:  Follow up prn  Signed Char Feltman Verne Spurr, MD, 05/04/2023 12:28 PM    [facility] at [facility] [address] [place], [place], [place] [address]

## 2023-06-26 ENCOUNTER — Other Ambulatory Visit: Payer: Self-pay | Admitting: Internal Medicine

## 2023-06-26 ENCOUNTER — Telehealth: Payer: Self-pay | Admitting: Internal Medicine

## 2023-06-26 ENCOUNTER — Ambulatory Visit: Attending: Internal Medicine

## 2023-06-26 DIAGNOSIS — R42 Dizziness and giddiness: Secondary | ICD-10-CM

## 2023-06-26 DIAGNOSIS — R002 Palpitations: Secondary | ICD-10-CM

## 2023-06-26 NOTE — Telephone Encounter (Signed)
 Per Dr. Jenene Slicker: Get 2 week monitor, NL.   Left message for patient regarding monitor being mailed and can call if he has any questions

## 2023-06-26 NOTE — Telephone Encounter (Signed)
 Checking percert on the following   2 week XT

## 2023-06-26 NOTE — Telephone Encounter (Signed)
 STAT if patient feels like he/she is going to faint   1. Are you feeling dizzy, lightheaded, or faint right now? Been dizzy for 3 months and still having it every day - his last visit he said he was told  to call back, if the dizziness continued   2. Have you passed out?   (If yes move to .SYNCOPECHMG) no   3. Do you have any other symptoms? no   4. Have you checked your HR and BP (record if available)? Blood pressure and heart rate

## 2023-06-26 NOTE — Telephone Encounter (Signed)
 Spoke with patient stated that he has had dizziness going on for 3 months or longer. Sometimes he doesn't feel the palpitations,but does feel lightheaded. Went to his PCP and BP was 135/88 HR 68. She didn't make any changes to his blood pressure medications since they where ranging in the 130s. He does keep a log as well. Per last office visit note mentioned that she may order heart monitor to be mailed out. I advised him will send to provider. Please advise

## 2023-08-07 ENCOUNTER — Telehealth: Payer: Self-pay | Admitting: Internal Medicine

## 2023-08-07 NOTE — Telephone Encounter (Signed)
 Pt called in to ask if there is any update on getting heart monitor results back.

## 2023-08-07 NOTE — Telephone Encounter (Signed)
 Patient advised Doctor was out office last week and will send over this message for her to review heart monitor reaults

## 2023-08-09 MED ORDER — METOPROLOL TARTRATE 25 MG PO TABS: 25.0000 mg | ORAL_TABLET | Freq: Two times a day (BID) | ORAL | 1 refills | Status: DC

## 2023-08-09 NOTE — Telephone Encounter (Signed)
 Patient informed and verbalized understanding of plan. Rx sent to pharmacy  Will route to scheduling so they can schedule patient in 2 weeks with Dr.Mallipeddi she states overbooking will be fine.

## 2023-08-17 ENCOUNTER — Telehealth: Payer: Self-pay | Admitting: Internal Medicine

## 2023-08-17 NOTE — Telephone Encounter (Signed)
 Pt c/o medication issue:  1. Name of Medication: Metoprolol   2. How are you currently taking this medication (dosage and times per day)?   3. Are you having a reaction (difficulty breathing--STAT)?   4. What is your medication issue? He said he started on this medicine last week. He said it is making him feel worse. He says his blood pressure runs low sometimes and it makes him feel lightheaded. He says he is still having the same symptoms he had before he start taking this medicine. He says he is going to stop taking the Metoprolol .

## 2023-08-17 NOTE — Telephone Encounter (Signed)
 Event monitor reviewed. He has 22.9% PVC burden (<1% is normal) and 69 runs of NSVT. PVCs are likely causing dizziness. Start metoprolol  tartarate 25 mg BID. If his dizziness worsens with metoprolol , he needs to stop the medication and notify us . Schedule follow up with me in 2 weeks. Overbook okay.   Patient informed and verbalized understanding of plan. Patient is stopping metoprolol  and patient is scheduled for 5/21 with Dr.Mallipeddi

## 2023-08-18 DIAGNOSIS — R42 Dizziness and giddiness: Secondary | ICD-10-CM

## 2023-08-18 DIAGNOSIS — R002 Palpitations: Secondary | ICD-10-CM

## 2023-08-23 ENCOUNTER — Telehealth

## 2023-08-23 ENCOUNTER — Ambulatory Visit: Attending: Internal Medicine | Admitting: Internal Medicine

## 2023-08-23 ENCOUNTER — Encounter: Payer: Self-pay | Admitting: Internal Medicine

## 2023-08-23 VITALS — BP 118/70 | HR 72 | Ht 73.0 in | Wt 207.4 lb

## 2023-08-23 DIAGNOSIS — I493 Ventricular premature depolarization: Secondary | ICD-10-CM

## 2023-08-23 MED ORDER — LISINOPRIL 5 MG PO TABS: 5.0000 mg | ORAL_TABLET | Freq: Every day | ORAL | 5 refills | Status: DC

## 2023-08-23 NOTE — Patient Instructions (Addendum)
 Medication Instructions:  Your physician has recommended you make the following change in your medication:  Decrease Lisinopril  from 40 mg to 5 mg once daily Continue taking all other medications as prescribed  Labwork: None  Testing/Procedures: Your physician has requested that you have an echocardiogram. Echocardiography is a painless test that uses sound waves to create images of your heart. It provides your doctor with information about the size and shape of your heart and how well your heart's chambers and valves are working. This procedure takes approximately one hour. There are no restrictions for this procedure. Please do NOT wear cologne, perfume, aftershave, or lotions (deodorant is allowed). Please arrive 15 minutes prior to your appointment time.  Please note: We ask at that you not bring children with you during ultrasound (echo/ vascular) testing. Due to room size and safety concerns, children are not allowed in the ultrasound rooms during exams. Our front office staff cannot provide observation of children in our lobby area while testing is being conducted. An adult accompanying a patient to their appointment will only be allowed in the ultrasound room at the discretion of the ultrasound technician under special circumstances. We apologize for any inconvenience.  Your physician has recommended that you have a sleep study. This test records several body functions during sleep, including: brain activity, eye movement, oxygen and carbon dioxide blood levels, heart rate and rhythm, breathing rate and rhythm, the flow of air through your mouth and nose, snoring, body muscle movements, and chest and belly movement.   Follow-Up: Your physician recommends that you schedule a follow-up appointment in: 2 months with EKG  Any Other Special Instructions Will Be Listed Below (If Applicable). Thank you for choosing [facility]!     If you need a refill on your cardiac medications  before your next appointment, please call your pharmacy.

## 2023-08-23 NOTE — Telephone Encounter (Signed)
 Please Pre-Cert

## 2023-08-23 NOTE — Progress Notes (Signed)
 Cardiology Office Note  Date: 08/23/2023   ID: Thomas Summers, Thomas Summers 01-Dec-1955, MRN [id number]  PCP:  Gwenyth Leo, FNP  Cardiologist:  Bradon Fester P Lavoris Canizales, MD Electrophysiologist:  None   History of Present Illness: Thomas Summers is a 68 y.o. male known to have HTN, DM 2, HLD is here for follow-up visit.   Initially referred to cardiology clinic for evaluation of elevated proBNP.  But he did not have any symptoms of DOE.  However he did have symptoms of dizziness for which event monitor was performed that showed  22.9% PVC burden, 69 runs of NSVT (fastest lasting 10 beats and the longest lasting 18 beats).  Symptomatic with NSR, PAC, PVC, ventricular bigeminy and ventricular trigeminy.  He also had 4.9% PAC burden and 49 runs of nonsustained SVT.  He was started on metoprolol  tartrate 25 mg BID.  He is here today for follow-up visit.  Patient has been taking both metoprolol  and lisinopril , he did notice a few low blood pressures and hence stopped taking metoprolol .  He resumed taking metoprolol  again a few days ago.  Currently on lisinopril  40 mg once daily and metoprolol  titrate 100 mg twice daily.  He is not able to tell me if his dizziness/presyncope improved after taking metoprolol  due to short span.  Denies having any symptoms of angina, DOE, orthopnea, leg swelling.  No syncope.  Past Medical History:  Diagnosis Date   Allergy    Depression    Diabetes mellitus without complication (HCC)    Heart murmur    Hyperlipidemia    Hypertension     Past Surgical History:  Procedure Laterality Date   COLONOSCOPY N/A 08/13/2015   Procedure: COLONOSCOPY;  Surgeon: Ruby Corporal, MD;  [facility];  Service: Endoscopy;  Laterality: N/A;  1200   COLONOSCOPY  08/13/2015   [facility] - Dr.Rehman   COLONOSCOPY N/A 01/14/2021   Procedure: COLONOSCOPY;  Surgeon: Ruby Corporal, MD;  [facility];  Service: Endoscopy;  Laterality: N/A;  7:30   MENISCUS  REPAIR Left 1997   OTHER SURGICAL HISTORY     left ear surgery   OTHER SURGICAL HISTORY     fracture arm   POLYPECTOMY  01/14/2021   Procedure: POLYPECTOMY;  Surgeon: Ruby Corporal, MD;  [facility];  Service: Endoscopy;;   TONSILLECTOMY      Current Outpatient Medications  Medication Sig Dispense Refill   atorvastatin  (LIPITOR) 20 MG tablet TAKE 1 TABLET BY MOUTH AT BEDTIME 90 tablet 1   blood glucose meter kit and supplies KIT Dispense based on patient and insurance preference. ( One touch ) diabetes type 2. E11.9. Test blood sugar once a day 1 each 5   fluticasone  (FLONASE ) 50 MCG/ACT nasal spray Place 1 spray into both nostrils daily as needed.     glucose blood test strip Use 1x per day as directed. 100 each 3   lisinopril  (ZESTRIL ) 5 MG tablet Take 1 tablet (5 mg total) by mouth daily. 30 tablet 5   metFORMIN (GLUCOPHAGE) 500 MG tablet Take 500 mg by mouth daily.     metoprolol  tartrate (LOPRESSOR ) 25 MG tablet Take 25 mg by mouth 2 (two) times daily.     OneTouch Delica Lancets 33G MISC USE ONE  TO CHECK GLUCOSE ONCE DAILY 100 each 1   silodosin  (RAPAFLO ) 8 MG CAPS capsule TAKE 1 CAPSULE BY MOUTH ONCE DAILY WITH BREAKFAST 30 capsule 0   No current facility-administered  medications for this visit.   Allergies:  Patient has no known allergies.   Social History: The patient  reports that he has never smoked. He has never used smokeless tobacco. He reports that he does not drink alcohol and does not use drugs.   Family History: The patient's family history includes Crohn's disease in his daughter; Healthy in his brother, brother, daughter, mother, sister, and sister; Heart disease in his father; Hypertension in his father; Migraines in his daughter.   ROS:  Please see the history of present illness. Otherwise, complete review of systems is positive for none  All other systems are reviewed and negative.   Physical Exam: VS:  BP 118/70   Pulse 72   Ht 6\' 1"  (1.854  m)   Wt 207 lb 6.4 oz (94.1 kg)   SpO2 99%   BMI 27.36 kg/m , BMI Body mass index is 27.36 kg/m.  Wt Readings from Last 3 Encounters:  08/23/23 207 lb 6.4 oz (94.1 kg)  05/04/23 223 lb (101.2 kg)  01/14/21 215 lb (97.5 kg)    General: Patient appears comfortable at rest. HEENT: Conjunctiva and lids normal, oropharynx clear with moist mucosa. Neck: Supple, no elevated JVP or carotid bruits, no thyromegaly. Lungs: Clear to auscultation, nonlabored breathing at rest. Cardiac: Regular rate and rhythm, no S3 or significant systolic murmur, no pericardial rub. Abdomen: Soft, nontender, no hepatomegaly, bowel sounds present, no guarding or rebound. Extremities: No pitting edema, distal pulses 2+. Skin: Warm and dry. Musculoskeletal: No kyphosis. Neuropsychiatric: Alert and oriented x3, affect grossly appropriate.  Recent Labwork: No results found for requested labs within last 365 days.     Component Value Date/Time   CHOL 149 11/10/2020 0806   TRIG 77 11/10/2020 0806   HDL 63 11/10/2020 0806   CHOLHDL 2.4 11/10/2020 0806   LDLCALC 71 11/10/2020 0806    Assessment and Plan:  Frequent PVCs, symptomatic: 22.9% PVC burden, 69 runs of NSVT (fastest lasting 10 beats and the longest lasting 18 beats).  Symptomatic with NSR, PAC, PVC, ventricular bigeminy and ventricular trigeminy.  Does not report palpitations but had dizziness/presyncope with PVCs.  Metoprolol  was started around 2 weeks ago but he discontinued for a few days and resumed back again.  Also on lisinopril  40 mg once daily, will cut back on the dose to 5 mg once daily.  Obtain 2D echocardiogram to rule out any structural heart disease.  Open watch for to rule out sleep apnea.  HTN, controlled: He had couple of low blood pressures after starting to take lisinopril  and metoprolol  together.  Will decrease the dose of lisinopril  from 40 mg to 5 mg once daily and continue metoprolol  tartrate 25 mg twice daily.   DM 2, on metformin:  Follow with PCP.  HLD, unknown values: Continue atorvastatin  20 mg nightly, goal LDL less than 100.       Medication Adjustments/Labs and Tests Ordered: Current medicines are reviewed at length with the patient today.  Concerns regarding medicines are outlined above.    Disposition:  Follow up 2 months  Signed Jamion Carter Priya Rami Budhu, MD, 08/23/2023 1:51 PM    [facility] at [facility] [address] [place], [place], [place] [address]

## 2023-08-24 ENCOUNTER — Other Ambulatory Visit: Payer: Self-pay

## 2023-08-24 ENCOUNTER — Encounter (HOSPITAL_COMMUNITY): Payer: Self-pay

## 2023-08-24 ENCOUNTER — Emergency Department (HOSPITAL_COMMUNITY)
Admission: EM | Admit: 2023-08-24 | Discharge: 2023-08-24 | Disposition: A | Discharge status: Home / Self Care | Attending: Student | Admitting: Student

## 2023-08-24 DIAGNOSIS — Z7984 Long term (current) use of oral hypoglycemic drugs: Secondary | ICD-10-CM | POA: Insufficient documentation

## 2023-08-24 DIAGNOSIS — Z79899 Other long term (current) drug therapy: Secondary | ICD-10-CM | POA: Insufficient documentation

## 2023-08-24 DIAGNOSIS — I10 Essential (primary) hypertension: Secondary | ICD-10-CM | POA: Insufficient documentation

## 2023-08-24 DIAGNOSIS — R42 Dizziness and giddiness: Secondary | ICD-10-CM | POA: Insufficient documentation

## 2023-08-24 DIAGNOSIS — E119 Type 2 diabetes mellitus without complications: Secondary | ICD-10-CM | POA: Insufficient documentation

## 2023-08-24 LAB — COMPREHENSIVE METABOLIC PANEL WITH GFR
ALT: 26 U/L (ref 0–44)
AST: 19 U/L (ref 15–41)
Albumin: 4.1 g/dL (ref 3.5–5.0)
Alkaline Phosphatase: 61 U/L (ref 38–126)
Anion gap: 8 (ref 5–15)
BUN: 18 mg/dL (ref 8–23)
CO2: 22 mmol/L (ref 22–32)
Calcium: 8.7 mg/dL — ABNORMAL LOW (ref 8.9–10.3)
Chloride: 100 mmol/L (ref 98–111)
Creatinine, Ser: 0.71 mg/dL (ref 0.61–1.24)
GFR, Estimated: >60 mL/min (ref >60)
Glucose, Bld: 107 mg/dL — ABNORMAL HIGH (ref 70–99)
Potassium: 3.8 mmol/L (ref 3.5–5.1)
Sodium: 130 mmol/L — ABNORMAL LOW (ref 135–145)
Total Bilirubin: 1 mg/dL (ref 0.0–1.2)
Total Protein: 6.8 g/dL (ref 6.5–8.1)

## 2023-08-24 LAB — URINALYSIS, ROUTINE W REFLEX MICROSCOPIC
Bilirubin Urine: NEGATIVE
Glucose, UA: NEGATIVE mg/dL
Hgb urine dipstick: NEGATIVE
Ketones, ur: NEGATIVE mg/dL
Leukocytes,Ua: NEGATIVE
Nitrite: NEGATIVE
Protein, ur: NEGATIVE mg/dL
Specific Gravity, Urine: 1.004 — ABNORMAL LOW (ref 1.005–1.030)
pH: 7 (ref 5.0–8.0)

## 2023-08-24 LAB — CBC
HCT: 40.8 % (ref 39.0–52.0)
Hemoglobin: 14 g/dL (ref 13.0–17.0)
MCH: 32.3 pg (ref 26.0–34.0)
MCHC: 34.3 g/dL (ref 30.0–36.0)
MCV: 94 fL (ref 80.0–100.0)
Platelets: 185 10*3/uL (ref 150–400)
RBC: 4.34 MIL/uL (ref 4.22–5.81)
RDW: 15.2 % (ref 11.5–15.5)
WBC: 8.1 10*3/uL (ref 4.0–10.5)
nRBC: 0 % (ref 0.0–0.2)

## 2023-08-24 LAB — TROPONIN I (HIGH SENSITIVITY): Troponin I (High Sensitivity): 5 ng/L (ref <18)

## 2023-08-24 LAB — CBG MONITORING, ED: Glucose-Capillary: 101 mg/dL — ABNORMAL HIGH (ref 70–99)

## 2023-08-24 LAB — MAGNESIUM: Magnesium: 2.2 mg/dL (ref 1.7–2.4)

## 2023-08-24 NOTE — ED Triage Notes (Signed)
 Pt reports:  Feeling unwell  Started around 1800 Not show if it is due to blood sugar or heart arrhythmia Headache (mild) Lightheadedness  Pressure in eyes Increased Blood Sugar 178 BG at home at 1800 Hx of heart arrhythmia  Recent adjustment of heart medications Metoprolol  25 mg (New) Had to stop for a few days due to low BP Started back on 5/17 Lisinopril  40mg  to 5mg  Started 5mg  yesterday

## 2023-08-25 NOTE — ED Provider Notes (Signed)
 [place] EMERGENCY DEPARTMENT AT [facility] Provider Note  CSN: [id number] Arrival date & time: 08/24/23 2110  Chief Complaint(s) Dizziness  HPI Thomas Summers is a 68 y.o. male with PMH T2DM, HTN, HLD, known PVCs who presents emergency room for an episode of dizziness and discomfort.  This is currently being evaluated by cardiology where he had an event monitor that showed a 22.9% PVC burden with 69 runs of NSVT, PACs, PVCs, ventricular bigeminy and trigeminy.  Was recently restarted back on his metoprolol .  Today he had another episode of dizziness that lasted approximately 2 hours and has resolved upon emergency department evaluation today.  Here in the ER he is denying any chest pain, shortness of breath, abdominal pain, nausea, vomiting, headache, fever or other systemic symptoms.  States he did check his sugar during this event and it was not below.   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 Authorizing Provider  atorvastatin  (LIPITOR) 20 MG tablet TAKE 1 TABLET BY MOUTH AT BEDTIME 11/27/20   Carolynne Citron, Malena M, DO  blood glucose meter kit and supplies KIT  Dispense based on patient and insurance preference. ( One touch ) diabetes type 2. E11.9. Test blood sugar once a day 04/08/16   Areta Kuba, MD  fluticasone  (FLONASE ) 50 MCG/ACT nasal spray Place 1 spray into both nostrils daily as needed. 02/18/23   [provider]  glucose blood test strip Use 1x per day as directed. 11/27/20   Glena Landau M, DO  lisinopril  (ZESTRIL ) 5 MG tablet Take 1 tablet (5 mg total) by mouth daily. 08/23/23   Mallipeddi, Vishnu P, MD  metFORMIN (GLUCOPHAGE) 500 MG tablet Take 500 mg by mouth daily. 05/05/22   [provider]  metoprolol  tartrate (LOPRESSOR ) 25 MG tablet Take 25 mg by mouth 2 (two) times daily.    [provider]  OneTouch Delica Lancets 33G MISC USE ONE  TO CHECK GLUCOSE ONCE DAILY 11/27/20   Carolynne Citron, Malena M, DO  silodosin  (RAPAFLO ) 8 MG CAPS capsule TAKE 1 CAPSULE BY MOUTH ONCE DAILY WITH BREAKFAST 09/08/21   McKenzie, Arden Beck, MD  Past Surgical History Past Surgical History:  Procedure Laterality Date   COLONOSCOPY N/A 08/13/2015   Procedure: COLONOSCOPY;  Surgeon: Ruby Corporal, MD;  [facility];  Service: Endoscopy;  Laterality: N/A;  1200   COLONOSCOPY  08/13/2015   [facility] - Dr.Rehman   COLONOSCOPY N/A 01/14/2021   Procedure: COLONOSCOPY;  Surgeon: Ruby Corporal, MD;  Location: [facility];  Service: Endoscopy;  Laterality: N/A;  7:30   MENISCUS REPAIR Left 1997   OTHER SURGICAL HISTORY     left ear surgery   OTHER SURGICAL HISTORY     fracture arm   POLYPECTOMY  01/14/2021   Procedure: POLYPECTOMY;  Surgeon: Ruby Corporal, MD;  Location: [facility];  Service: Endoscopy;;   TONSILLECTOMY     Family History Family History  Problem Relation Age of Onset   Hypertension Father    Heart disease Father    Healthy Mother    Healthy Sister    Healthy Brother     Healthy Sister    Healthy Brother    Crohn's disease Daughter    Migraines Daughter    Healthy Daughter     Social History Social History   Tobacco Use   Smoking status: Never   Smokeless tobacco: Never  Substance Use Topics   Alcohol use: No   Drug use: No   Allergies Patient has no known allergies.  Review of Systems Review of Systems  Neurological:  Positive for dizziness and light-headedness.    Physical Exam Vital Signs  I have reviewed the triage vital signs BP 126/61   Pulse (!) 57   Temp 98 F (36.7 C)   Resp 16   Ht 6\' 1"  (1.854 m)   Wt 88.9 kg   SpO2 99%   BMI 25.86 kg/m   Physical Exam Vitals and nursing note reviewed.  Constitutional:      General: He is not in acute distress.    Appearance: He is well-developed.  HENT:     Head: Normocephalic and atraumatic.  Eyes:     Conjunctiva/sclera: Conjunctivae normal.  Cardiovascular:     Rate and Rhythm: Normal rate and regular rhythm.     Heart sounds: No murmur heard. Pulmonary:     Effort: Pulmonary effort is normal. No respiratory distress.     Breath sounds: Normal breath sounds.  Abdominal:     Palpations: Abdomen is soft.     Tenderness: There is no abdominal tenderness.  Musculoskeletal:        General: No swelling.     Cervical back: Neck supple.  Skin:    General: Skin is warm and dry.     Capillary Refill: Capillary refill takes less than 2 seconds.  Neurological:     Mental Status: He is alert.  Psychiatric:        Mood and Affect: Mood normal.     ED Results and Treatments Labs (all labs ordered are listed, but only abnormal results are displayed) Labs Reviewed  COMPREHENSIVE METABOLIC PANEL WITH GFR - Abnormal; Notable for the following components:      Result Value   Sodium 130 (*)    Glucose, Bld 107 (*)    Calcium  8.7 (*)    All other components within normal limits  URINALYSIS, ROUTINE W REFLEX MICROSCOPIC - Abnormal; Notable for the following components:   Color,  Urine STRAW (*)    Specific Gravity, Urine 1.004 (*)    All other components within normal limits  CBG MONITORING, ED - Abnormal; Notable for the following components:   Glucose-Capillary 101 (*)    All other components within normal limits  CBC  MAGNESIUM  CBG MONITORING, ED  TROPONIN I (HIGH SENSITIVITY)                                                                                                                          Radiology No results found.  Pertinent labs & imaging results that were available during my care of the patient were reviewed by me and considered in my medical decision making (see MDM for details).  Medications Ordered in ED Medications - No data to display                                                                                                                                   Procedures Procedures  (including critical care time)  Medical Decision Making / ED Course   This patient presents to the ED for concern of dizziness, this involves an extensive number of treatment options, and is a complaint that carries with it a high risk of complications and morbidity.  The differential diagnosis includes electrolyte abnormality, arrhythmia, medication side effect, presyncope, hypoglycemia, dehydration  MDM: Patient seen emergency room for evaluation of an episode of dizziness.  Physical exam is largely unremarkable with no murmurs on cardiac exam, neurologic exam unremarkable.  Laboratory evaluation with a mild hyponatremia to 130 but is otherwise unremarkable.  High-sensitivity troponin is normal, magnesium is normal.  ECG with right bundle branch block, intermittent PVCs but no evidence of Brugada or WPW.  Patient observed in the emergency room for 2 hours without return of his dizziness.  Suspect patient may have had a round of increased PVC burden similar to what has been going on in the outpatient setting but his cardiologist and he will speak with his  cardiologist about this.  As his symptoms appear to have resolved, I encouraged the patient to continue his metoprolol  regimen and check back in with his cardiologist.  States he does have follow-up next week and I encouraged them to ask about getting repeat sodium testing to ensure that this is not downtrending and becoming symptomatic.  At this time he currently does not meet inpatient criteria for admission and will be discharged with outpatient follow-up.  Return precautions given which his wife voiced understanding.   Additional history obtained: -Additional  history obtained from wife -External records from outside source obtained and reviewed including: Chart review including previous notes, labs, imaging, consultation notes   Lab Tests: -I ordered, reviewed, and interpreted labs.   The pertinent results include:   Labs Reviewed  COMPREHENSIVE METABOLIC PANEL WITH GFR - Abnormal; Notable for the following components:      Result Value   Sodium 130 (*)    Glucose, Bld 107 (*)    Calcium  8.7 (*)    All other components within normal limits  URINALYSIS, ROUTINE W REFLEX MICROSCOPIC - Abnormal; Notable for the following components:   Color, Urine STRAW (*)    Specific Gravity, Urine 1.004 (*)    All other components within normal limits  CBG MONITORING, ED - Abnormal; Notable for the following components:   Glucose-Capillary 101 (*)    All other components within normal limits  CBC  MAGNESIUM  CBG MONITORING, ED  TROPONIN I (HIGH SENSITIVITY)      EKG  Right bundle branch block with intermittent PVCs     Medicines ordered and prescription drug management: No orders of the defined types were placed in this encounter.   -I have reviewed the patients home medicines and have made adjustments as needed  Critical interventions none   Cardiac Monitoring: The patient was maintained on a cardiac monitor.  I personally viewed and interpreted the cardiac monitored which showed  an underlying rhythm of: NSR with intermittent PVCs  Social Determinants of Health:  Factors impacting patients care include: none   Reevaluation: After the interventions noted above, I reevaluated the patient and found that they have :stayed the same  Co morbidities that complicate the patient evaluation  Past Medical History:  Diagnosis Date   Allergy    Depression    Diabetes mellitus without complication (HCC)    Heart murmur    Hyperlipidemia    Hypertension       Dispostion: I considered admission for this patient, but at this time he does not meet inpatient criteria for admission and will be discharged with outpatient follow-up.     Final Clinical Impression(s) / ED Diagnoses Final diagnoses:  Dizziness     @PCDICTATION @    Karlyn Overman, MD 08/25/23 1036

## 2023-08-31 NOTE — Telephone Encounter (Signed)
 Ordering provider: DR Arthea Larsson Associated diagnoses: I49.3 WatchPAT PA obtained on 08/31/2023 by Gaylene Kays, CMA. Authorization: No; tracking ID NO AUTH REQ Patient notified of PIN ([id number]) on 08/31/2023 via Notification Method: phone.

## 2023-09-01 NOTE — Telephone Encounter (Signed)
 Called patient to verify that he had received notification that he didn't need prior auth. Patient stated that he didn't. Advised him of instructions given and provided pin code. Patient verbalized understanding

## 2023-09-06 ENCOUNTER — Encounter (INDEPENDENT_AMBULATORY_CARE_PROVIDER_SITE_OTHER): Payer: Self-pay | Admitting: Cardiology

## 2023-09-06 DIAGNOSIS — G4733 Obstructive sleep apnea (adult) (pediatric): Secondary | ICD-10-CM | POA: Diagnosis not present

## 2023-09-10 NOTE — Procedures (Signed)
   SLEEP STUDY REPORT Patient Information Study Date: 09/06/2023 Patient Name: Thomas Summers Patient ID: [id number] Birth Date: March 13, 1956 Age: 68 Gender: Male BMI: 27.5 (W=207 lb, H=6' 1'') Referring Physician: Vishnu Mallipeddi, MD  TEST DESCRIPTION: Home sleep apnea testing was completed using the WatchPat, a Type 1 device, utilizing peripheral arterial tonometry (PAT), chest movement, actigraphy, pulse oximetry, pulse rate, body position and snore. AHI was calculated with apnea and hypopnea using valid sleep time as the denominator. RDI includes apneas, hypopneas, and RERAs. The data acquired and the scoring of sleep and all associated events were performed in accordance with the recommended standards and specifications as outlined in the AASM Manual for the Scoring of Sleep and Associated Events 2.2.0 (2015). FINDINGS:  1. Mild Obstructive Sleep Apnea with AHI 6.2/hr.  2. No Central Sleep Apnea with pAHIc 0.4/hr.  3. Oxygen desaturations as low as 84%.  4. Mild snoring was present. O2 sats were < 88% for 0 min.  5. Total sleep time was 2 hrs and 25 min.  6. 7.7% of total sleep time was spent in REM sleep.  7. Normal sleep onset latency at 19 min.  8. Shortened REM sleep onset latency at 54 min.  9. Total awakenings were 8. 10. Arrhythmia detection: None DIAGNOSIS: Nondiagnostic study due to lack of adequate sleep time and poor PAT tracing .  RECOMMENDATIONS: 1. Recommend repeat Home sleep study 2. Patient needs to be taught how to use the device prior to study  Signature: Gaylyn Keas, MD; [facility]; Diplomat, American Board of Sleep Medicine Electronically Signed: 09/10/2023 8:49:47 PM

## 2023-09-11 ENCOUNTER — Telehealth: Payer: Self-pay | Admitting: Cardiology

## 2023-09-11 NOTE — Telephone Encounter (Signed)
 Patient called to follow-up on his sleep apnea test results.

## 2023-09-13 ENCOUNTER — Ambulatory Visit: Attending: Internal Medicine

## 2023-09-13 DIAGNOSIS — I493 Ventricular premature depolarization: Secondary | ICD-10-CM | POA: Diagnosis not present

## 2023-09-13 DIAGNOSIS — I350 Nonrheumatic aortic (valve) stenosis: Secondary | ICD-10-CM

## 2023-09-14 LAB — ECHOCARDIOGRAM COMPLETE
AR max vel: 3.13 cm2
AV Area VTI: 3.1 cm2
AV Area mean vel: 2.99 cm2
AV Mean grad: 3 mmHg
AV Peak grad: 5.4 mmHg
Ao pk vel: 1.16 m/s
Area-P 1/2: 3.01 cm2
Calc EF: 52.3 %
MV VTI: 2.26 cm2
S' Lateral: 3.5 cm
Single Plane A2C EF: 60.1 %
Single Plane A4C EF: 42.6 %

## 2023-09-15 ENCOUNTER — Telehealth: Payer: Self-pay

## 2023-09-15 ENCOUNTER — Ambulatory Visit: Payer: Self-pay | Admitting: Internal Medicine

## 2023-09-15 DIAGNOSIS — I493 Ventricular premature depolarization: Secondary | ICD-10-CM

## 2023-09-15 DIAGNOSIS — R42 Dizziness and giddiness: Secondary | ICD-10-CM

## 2023-09-15 DIAGNOSIS — I998 Other disorder of circulatory system: Secondary | ICD-10-CM

## 2023-09-15 DIAGNOSIS — Z136 Encounter for screening for cardiovascular disorders: Secondary | ICD-10-CM

## 2023-09-15 DIAGNOSIS — Z01810 Encounter for preprocedural cardiovascular examination: Secondary | ICD-10-CM

## 2023-09-15 DIAGNOSIS — R7989 Other specified abnormal findings of blood chemistry: Secondary | ICD-10-CM

## 2023-09-15 DIAGNOSIS — R002 Palpitations: Secondary | ICD-10-CM

## 2023-09-15 DIAGNOSIS — G4733 Obstructive sleep apnea (adult) (pediatric): Secondary | ICD-10-CM

## 2023-09-15 NOTE — Telephone Encounter (Signed)
 Notifed patietn of sleep study results and recomednations. All quesoitn were answered and patient verbalized understanding. Repeat [facility] Test ordered today.

## 2023-09-15 NOTE — Telephone Encounter (Signed)
-----   Message from Gaylyn Keas sent at 09/10/2023  8:51 PM EDT ----- Needs repeat sleep study due to lack of adequate sleep time and poor PAT tracing

## 2023-09-18 MED ORDER — AMIODARONE HCL 200 MG PO TABS: 200.0000 mg | ORAL_TABLET | Freq: Two times a day (BID) | ORAL | 5 refills | Status: DC

## 2023-09-18 NOTE — Telephone Encounter (Signed)
 The patient has been notified of the result and verbalized understanding.  All questions (if any) were answered. Camilo Cella, [place] 09/18/2023 10:18 AM

## 2023-09-18 NOTE — Telephone Encounter (Signed)
-----   Message from Vishnu P Mallipeddi sent at 09/15/2023  1:21 PM EDT ----- LV function is mildly reduced, 40 to 45% (55% and more is normal), RV function is normal, mild MR and no significant valvular heart disease.  CVP is 8 mmHg.  Obtain CT cardiac for ischemia  evaluation.  If he continues to have dizziness despite on metoprolol , will need to add amiodarone 200 mg twice daily.  If he experiences any new side effects with amiodarone, have to discontinue the  medication.  If he wants to be seen to discuss the results and medications, schedule in the next 1 to 2 weeks, overbook okay. ----- Message ----- From: Interface, [employer] Sent: 09/14/2023   9:01 AM EDT To: Vishnu P Mallipeddi, MD

## 2023-09-25 NOTE — Telephone Encounter (Signed)
See 6/13/note

## 2023-09-29 ENCOUNTER — Telehealth (HOSPITAL_COMMUNITY): Payer: Self-pay | Admitting: *Deleted

## 2023-09-29 NOTE — Telephone Encounter (Signed)
 Reaching out to patient to offer assistance regarding upcoming cardiac imaging study; pt verbalizes understanding of appt date/time, parking situation and where to check in, pre-test NPO status verified current allergies; name and call back number provided for further questions should they arise  Chantal Requena RN Navigator Cardiac Imaging [facility] [phone] office [phone] cell  Patient to take his daily amiodarone and metoprolol  for his cardiac CT scan.

## 2023-10-02 ENCOUNTER — Ambulatory Visit (HOSPITAL_COMMUNITY)
Admission: RE | Admit: 2023-10-02 | Discharge: 2023-10-02 | Disposition: A | Discharge status: Home / Self Care | Source: Ambulatory Visit | Attending: Cardiovascular Disease | Admitting: Cardiovascular Disease

## 2023-10-02 DIAGNOSIS — K769 Liver disease, unspecified: Secondary | ICD-10-CM | POA: Insufficient documentation

## 2023-10-02 DIAGNOSIS — I2510 Atherosclerotic heart disease of native coronary artery without angina pectoris: Secondary | ICD-10-CM

## 2023-10-02 DIAGNOSIS — I517 Cardiomegaly: Secondary | ICD-10-CM | POA: Diagnosis not present

## 2023-10-02 DIAGNOSIS — I998 Other disorder of circulatory system: Secondary | ICD-10-CM | POA: Diagnosis present

## 2023-10-02 DIAGNOSIS — I700 Atherosclerosis of aorta: Secondary | ICD-10-CM | POA: Diagnosis not present

## 2023-10-02 MED ORDER — NITROGLYCERIN 0.4 MG SL SUBL: 0.8000 mg | SUBLINGUAL_TABLET | Freq: Once | SUBLINGUAL | Status: DC

## 2023-10-02 MED ORDER — IOHEXOL 350 MG/ML SOLN
100.0000 mL | Freq: Once | INTRAVENOUS | Status: AC | PRN
  Administered 2023-10-02: 100 mL via INTRAVENOUS

## 2023-10-03 ENCOUNTER — Ambulatory Visit (HOSPITAL_COMMUNITY)
Admission: RE | Admit: 2023-10-03 | Discharge: 2023-10-03 | Disposition: A | Discharge status: Home / Self Care | Source: Ambulatory Visit | Attending: Internal Medicine | Admitting: Internal Medicine

## 2023-10-03 ENCOUNTER — Ambulatory Visit (HOSPITAL_BASED_OUTPATIENT_CLINIC_OR_DEPARTMENT_OTHER): Payer: Self-pay | Admitting: Internal Medicine

## 2023-10-03 ENCOUNTER — Other Ambulatory Visit (HOSPITAL_BASED_OUTPATIENT_CLINIC_OR_DEPARTMENT_OTHER): Payer: Self-pay | Admitting: Internal Medicine

## 2023-10-03 ENCOUNTER — Ambulatory Visit: Payer: Self-pay | Admitting: Internal Medicine

## 2023-10-03 DIAGNOSIS — I2510 Atherosclerotic heart disease of native coronary artery without angina pectoris: Secondary | ICD-10-CM

## 2023-10-03 DIAGNOSIS — R931 Abnormal findings on diagnostic imaging of heart and coronary circulation: Secondary | ICD-10-CM | POA: Diagnosis present

## 2023-10-03 NOTE — Telephone Encounter (Signed)
 The patient has been notified of the result and verbalized understanding.  All questions (if any) were answered. Littie CHRISTELLA Croak, CMA 10/03/2023 11:24 AM

## 2023-10-03 NOTE — Progress Notes (Signed)
CT sent for FFR 

## 2023-10-03 NOTE — Telephone Encounter (Signed)
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  He has upcoming  appointment on 10/04/2023.  Will evaluate his symptoms and set him up for [facility]. ----- Message ----- From: Interface, Rad Results In Sent: 10/03/2023   9:29 AM EDT To: Vishnu P Mallipeddi, MD

## 2023-10-04 ENCOUNTER — Encounter: Payer: Self-pay | Admitting: Internal Medicine

## 2023-10-04 ENCOUNTER — Ambulatory Visit: Attending: Internal Medicine | Admitting: Internal Medicine

## 2023-10-04 ENCOUNTER — Telehealth: Payer: Self-pay | Admitting: Internal Medicine

## 2023-10-04 VITALS — BP 116/74 | HR 60 | Ht 73.0 in | Wt 196.8 lb

## 2023-10-04 DIAGNOSIS — I493 Ventricular premature depolarization: Secondary | ICD-10-CM

## 2023-10-04 DIAGNOSIS — I10 Essential (primary) hypertension: Secondary | ICD-10-CM

## 2023-10-04 DIAGNOSIS — Z01810 Encounter for preprocedural cardiovascular examination: Secondary | ICD-10-CM

## 2023-10-04 DIAGNOSIS — I2510 Atherosclerotic heart disease of native coronary artery without angina pectoris: Secondary | ICD-10-CM | POA: Insufficient documentation

## 2023-10-04 DIAGNOSIS — I429 Cardiomyopathy, unspecified: Secondary | ICD-10-CM | POA: Insufficient documentation

## 2023-10-04 MED ORDER — ASPIRIN 81 MG PO TBEC: 81.0000 mg | DELAYED_RELEASE_TABLET | Freq: Every day | ORAL | 5 refills | Status: AC

## 2023-10-04 NOTE — Telephone Encounter (Signed)
 Checking percert on the following patient   LHC scheduled on 07/09 @ 12:30 with Dr. Verlin

## 2023-10-04 NOTE — H&P (View-Only) (Signed)
 Cardiology Office Note  Date: 10/04/2023   ID: Thomas Summers, DOB Jul 26, 1955, MRN [id number]  PCP:  Alston Silvio BROCKS, FNP  Cardiologist:  Cordell Guercio P Atul Delucia, MD Electrophysiologist:  None   History of Present Illness: Thomas Summers is a 68 y.o. 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  HLD is here for follow-up visit.   Initially referred to cardiology clinic for evaluation of elevated proBNP.  But he did not have any symptoms of DOE.  However he did have symptoms of dizziness for which event monitor was performed that showed  22.9% PVC burden, 69 runs of NSVT (fastest lasting 10 beats and the longest lasting 18 beats).  Symptomatic with NSR, PAC, PVC, ventricular bigeminy and ventricular trigeminy.  He also had 4.9% PAC burden and 49 runs of nonsustained SVT.  He was started on metoprolol  tartrate 25 mg BID and eventually amiodarone 200 mg twice daily due to frequent PVCs on metoprolol .  Echocardiogram showed LVEF 40 to 45%, no valvular heart disease and CVP was 8 mmHg.  CT cardiac showed coronary artery calcium  score of 3167 (97th percentile for age and sex matched control), extensive total plaque volume and severe flow-limiting stenosis in the proximal to mid LAD and OM1.  He is here today for follow-up visit.  He is worried that his heart rate is dropping down to low 50s.  His dizziness improved after starting amiodarone.  EKG today showed NSR, occasional PVCs.  He did report 2-3 episodes of chest discomfort in the last couple of months but unable to recall the duration of frequency or when it happened.  No DOE, syncope or leg swelling.  Tolerating amiodarone well.  Past Medical History:  Diagnosis Date   Allergy    Depression    Diabetes mellitus without complication (HCC)    Heart murmur    Hyperlipidemia    Hypertension     Past Surgical History:  Procedure Laterality Date   COLONOSCOPY N/A 08/13/2015   Procedure:  COLONOSCOPY;  Surgeon: Claudis RAYMOND Rivet, MD;  [facility];  Service: Endoscopy;  Laterality: N/A;  1200   COLONOSCOPY  08/13/2015   [facility] - Dr.Rehman   COLONOSCOPY N/A 01/14/2021   Procedure: COLONOSCOPY;  Surgeon: Rivet Claudis RAYMOND, MD;  [facility];  Service: Endoscopy;  Laterality: N/A;  7:30   MENISCUS REPAIR Left 1997   OTHER SURGICAL HISTORY     left ear surgery   OTHER SURGICAL HISTORY     fracture arm   POLYPECTOMY  01/14/2021   Procedure: POLYPECTOMY;  Surgeon: Rivet Claudis RAYMOND, MD;  [facility];  Service: Endoscopy;;   TONSILLECTOMY      Current Outpatient Medications  Medication Sig Dispense Refill   amiodarone (PACERONE) 200 MG tablet Take 1 tablet (200 mg total) by mouth 2 (two) times daily. 60 tablet 5   aspirin EC 81 MG tablet Take 1 tablet (81 mg total) by mouth daily. Swallow whole. 30 tablet 5   atorvastatin  (LIPITOR) 20 MG tablet TAKE 1 TABLET BY MOUTH AT BEDTIME 90 tablet 1   blood glucose meter kit and supplies KIT Dispense based on patient and insurance preference. ( One touch ) diabetes type 2. E11.9. Test blood sugar once a day 1 each 5   fluticasone  (FLONASE ) 50 MCG/ACT nasal spray Place 1 spray into both nostrils daily as needed.     glucose blood test strip Use 1x per  day as directed. 100 each 3   lisinopril  (ZESTRIL ) 5 MG tablet Take 1 tablet (5 mg total) by mouth daily. 30 tablet 5   metFORMIN (GLUCOPHAGE) 500 MG tablet Take 500 mg by mouth daily.     metoprolol  tartrate (LOPRESSOR ) 25 MG tablet Take 12.5 mg by mouth 2 (two) times daily.     OneTouch Delica Lancets 33G MISC USE ONE  TO CHECK GLUCOSE ONCE DAILY 100 each 1   silodosin  (RAPAFLO ) 8 MG CAPS capsule TAKE 1 CAPSULE BY MOUTH ONCE DAILY WITH BREAKFAST 30 capsule 0   No current facility-administered medications for this visit.   Allergies:  Patient has no known allergies.   Social History: The patient  reports that he has never smoked. He has never used smokeless  tobacco. He reports that he does not drink alcohol and does not use drugs.   Family History: The patient's family history includes Crohn's disease in his daughter; Healthy in his brother, brother, daughter, mother, sister, and sister; Heart disease in his father; Hypertension in his father; Migraines in his daughter.   ROS:  Please see the history of present illness. Otherwise, complete review of systems is positive for none  All other systems are reviewed and negative.   Physical Exam: VS:  BP 116/74   Pulse 60   Ht 6' 1 (1.854 m)   Wt 196 lb 12.8 oz (89.3 kg)   SpO2 98%   BMI 25.96 kg/m , BMI Body mass index is 25.96 kg/m.  Wt Readings from Last 3 Encounters:  10/04/23 196 lb 12.8 oz (89.3 kg)  08/24/23 196 lb (88.9 kg)  08/23/23 207 lb 6.4 oz (94.1 kg)    General: Patient appears comfortable at rest. HEENT: Conjunctiva and lids normal, oropharynx clear with moist mucosa. Neck: Supple, no elevated JVP or carotid bruits, no thyromegaly. Lungs: Clear to auscultation, nonlabored breathing at rest. Cardiac: Regular rate and rhythm, no S3 or significant systolic murmur, no pericardial rub. Abdomen: Soft, nontender, no hepatomegaly, bowel sounds present, no guarding or rebound. Extremities: No pitting edema, distal pulses 2+. Skin: Warm and dry. Musculoskeletal: No kyphosis. Neuropsychiatric: Alert and oriented x3, affect grossly appropriate.  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  No DOE.  CT cardiac was performed due  to significant PVC burden of 22.9% and a new onset cardiomyopathy, to rule out ischemia.  Echocardiogram showed LVEF 40 to 45%, new onset.  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  Cardiac risk factors include HTN, DM 2.  Start aspirin 81 mg once daily, continue atorvastatin  20 mg nightly.  Informed consent for LHC Risks and benefits of cardiac catheterization have been discussed with the patient.  These include bleeding, infection, kidney damage, stroke, heart attack, death.  The patient understands these risks and is willing to proceed.  Frequent PVCs, symptomatic: Symptoms improved after starting amiodarone but he is worried that  his heart rates are in the low 50s.  EKG today showed NSR, occasional PVCs and HR 58 bpm.  Event monitor showed 22.9% PVC burden, 69 runs of NSVT (fastest lasting 10 beats and the longest lasting 18 beats).  Tolerating amiodarone well, continue amiodarone 200 mg twice daily and decrease the dose of metoprolol  tartrate from 25 mg to 12.5 mg twice daily.  He underwent home sleep study but that was inconclusive.  Will go ahead and order split-night sleep study to rule out sleep apnea.  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  Continue metoprolol  tartrate 12.5 mg twice daily (dose decreased from 25 mg) and continue lisinopril  5 mg once daily.  Soft blood pressures, will uptitrate GDMT after LHC.  HLD, unknown values: Continue atorvastatin  20 mg nightly, goal LDL will be less than 55 (due to extensive total plaque volume).  LDL 71 in 2022.  Obtain lipid panel report from the PCP, based on the lipid panel report, will need to uptitrate atorvastatin  dose.  HTN, controlled: Continue current medications, lisinopril  5 mg once daily and metoprolol  as stated above.   DM 2, on metformin: Follow with PCP.        Medication  Adjustments/Labs and Tests Ordered: Current medicines are reviewed at length with the patient today.  Concerns regarding medicines are outlined above.    Disposition:  Follow up 2 months  Signed Stone Spirito Priya Miles Leyda, MD, 10/04/2023 12:54 PM    [facility] at [facility] [address] [place], [place], [place] [address]

## 2023-10-04 NOTE — Patient Instructions (Addendum)
 Medication Instructions:  Your physician has recommended you make the following change in your medication:  Decreased Metoprolol  Tartrate from 25 mg to 12.5 mg twice daily  Continue taking all other medications as prescribed  Labwork: CBC and BMET to be completed today in [place]   Testing/Procedures: Your physician has recommended that you have a sleep study. This test records several body functions during sleep, including: brain activity, eye movement, oxygen and carbon dioxide blood levels, heart rate and rhythm, breathing rate and rhythm, the flow of air through your mouth and nose, snoring, body muscle movements, and chest and belly movement.   [facility] A DEPT OF [place] [place] [address] [place] [address] Dept: [phone] Loc: [phone]  CARVIN ALMAS  10/04/2023  You are scheduled for a Cardiac Catheterization on Wednesday, July 9 with Dr. Lonni Cash.  1. Please arrive at the [facility] (Main Entrance A) at [facility]: [address] [place], [place] [address] at 12:30 PM (This time is 2 hour(s) before your procedure to ensure your preparation).   Free valet parking service is available. You will check in at ADMITTING. The support person will be asked to wait in the waiting room.  It is OK to have someone drop you off and come back when you are ready to be discharged.    Special note: Every effort is made to have your procedure done on time. Please understand that emergencies sometimes delay scheduled procedures.  2. Diet: Do not eat solid foods after midnight.  The patient may have clear liquids until 5am upon the day of the procedure.  3. Labs: You will need to have blood drawn on July 2 at [facility] in [place]   4. Medication instructions in preparation for your procedure:   Contrast Allergy: No   Stop taking, Lisinopril  (Zestril  or Prinivil ) Tuesday, July 8,  and the day of the procedure   Do not take Diabetes Med Glucophage (Metformin) on the day of the procedure and HOLD 48 HOURS AFTER THE PROCEDURE.  On the morning of your procedure, take your Aspirin 81 mg and any morning medicines NOT listed above.  You may use sips of water .  5. Plan to go home the same day, you will only stay overnight if medically necessary. 6. Bring a current list of your medications and current insurance cards. 7. You MUST have a responsible person to drive you home. 8. Someone MUST be with you the first 24 hours after you arrive home or your discharge will be delayed. 9. Please wear clothes that are easy to get on and off and wear slip-on shoes.  Thank you for allowing us  to care for you!   -- [facility]   Follow-Up: Your physician recommends that you schedule a follow-up appointment in: 1 month after LHC  Any Other Special Instructions Will Be Listed Below (If Applicable). Thank you for choosing  HeartCare!     If you need a refill on your cardiac medications before your next appointment, please call your pharmacy.

## 2023-10-04 NOTE — Progress Notes (Signed)
 Cardiology Office Note  Date: 10/04/2023   ID: Thomas Summers, DOB Jul 26, 1955, MRN [id number]  PCP:  Alston Silvio BROCKS, FNP  Cardiologist:  Cordell Guercio P Atul Delucia, MD Electrophysiologist:  None   History of Present Illness: Thomas Summers is a 68 y.o. 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  HLD is here for follow-up visit.   Initially referred to cardiology clinic for evaluation of elevated proBNP.  But he did not have any symptoms of DOE.  However he did have symptoms of dizziness for which event monitor was performed that showed  22.9% PVC burden, 69 runs of NSVT (fastest lasting 10 beats and the longest lasting 18 beats).  Symptomatic with NSR, PAC, PVC, ventricular bigeminy and ventricular trigeminy.  He also had 4.9% PAC burden and 49 runs of nonsustained SVT.  He was started on metoprolol  tartrate 25 mg BID and eventually amiodarone 200 mg twice daily due to frequent PVCs on metoprolol .  Echocardiogram showed LVEF 40 to 45%, no valvular heart disease and CVP was 8 mmHg.  CT cardiac showed coronary artery calcium  score of 3167 (97th percentile for age and sex matched control), extensive total plaque volume and severe flow-limiting stenosis in the proximal to mid LAD and OM1.  He is here today for follow-up visit.  He is worried that his heart rate is dropping down to low 50s.  His dizziness improved after starting amiodarone.  EKG today showed NSR, occasional PVCs.  He did report 2-3 episodes of chest discomfort in the last couple of months but unable to recall the duration of frequency or when it happened.  No DOE, syncope or leg swelling.  Tolerating amiodarone well.  Past Medical History:  Diagnosis Date   Allergy    Depression    Diabetes mellitus without complication (HCC)    Heart murmur    Hyperlipidemia    Hypertension     Past Surgical History:  Procedure Laterality Date   COLONOSCOPY N/A 08/13/2015   Procedure:  COLONOSCOPY;  Surgeon: Claudis RAYMOND Rivet, MD;  [facility];  Service: Endoscopy;  Laterality: N/A;  1200   COLONOSCOPY  08/13/2015   [facility] - Dr.Rehman   COLONOSCOPY N/A 01/14/2021   Procedure: COLONOSCOPY;  Surgeon: Rivet Claudis RAYMOND, MD;  Location: [facility];  Service: Endoscopy;  Laterality: N/A;  7:30   MENISCUS REPAIR Left 1997   OTHER SURGICAL HISTORY     left ear surgery   OTHER SURGICAL HISTORY     fracture arm   POLYPECTOMY  01/14/2021   Procedure: POLYPECTOMY;  Surgeon: Rivet Claudis RAYMOND, MD;  Location: [facility];  Service: Endoscopy;;   TONSILLECTOMY      Current Outpatient Medications  Medication Sig Dispense Refill   amiodarone (PACERONE) 200 MG tablet Take 1 tablet (200 mg total) by mouth 2 (two) times daily. 60 tablet 5   aspirin EC 81 MG tablet Take 1 tablet (81 mg total) by mouth daily. Swallow whole. 30 tablet 5   atorvastatin  (LIPITOR) 20 MG tablet TAKE 1 TABLET BY MOUTH AT BEDTIME 90 tablet 1   blood glucose meter kit and supplies KIT Dispense based on patient and insurance preference. ( One touch ) diabetes type 2. E11.9. Test blood sugar once a day 1 each 5   fluticasone  (FLONASE ) 50 MCG/ACT nasal spray Place 1 spray into both nostrils daily as needed.     glucose blood test strip Use 1x per  day as directed. 100 each 3   lisinopril  (ZESTRIL ) 5 MG tablet Take 1 tablet (5 mg total) by mouth daily. 30 tablet 5   metFORMIN (GLUCOPHAGE) 500 MG tablet Take 500 mg by mouth daily.     metoprolol  tartrate (LOPRESSOR ) 25 MG tablet Take 12.5 mg by mouth 2 (two) times daily.     OneTouch Delica Lancets 33G MISC USE ONE  TO CHECK GLUCOSE ONCE DAILY 100 each 1   silodosin  (RAPAFLO ) 8 MG CAPS capsule TAKE 1 CAPSULE BY MOUTH ONCE DAILY WITH BREAKFAST 30 capsule 0   No current facility-administered medications for this visit.   Allergies:  Patient has no known allergies.   Social History: The patient  reports that he has never smoked. He has never used smokeless  tobacco. He reports that he does not drink alcohol and does not use drugs.   Family History: The patient's family history includes Crohn's disease in his daughter; Healthy in his brother, brother, daughter, mother, sister, and sister; Heart disease in his father; Hypertension in his father; Migraines in his daughter.   ROS:  Please see the history of present illness. Otherwise, complete review of systems is positive for none  All other systems are reviewed and negative.   Physical Exam: VS:  BP 116/74   Pulse 60   Ht 6' 1 (1.854 m)   Wt 196 lb 12.8 oz (89.3 kg)   SpO2 98%   BMI 25.96 kg/m , BMI Body mass index is 25.96 kg/m.  Wt Readings from Last 3 Encounters:  10/04/23 196 lb 12.8 oz (89.3 kg)  08/24/23 196 lb (88.9 kg)  08/23/23 207 lb 6.4 oz (94.1 kg)    General: Patient appears comfortable at rest. HEENT: Conjunctiva and lids normal, oropharynx clear with moist mucosa. Neck: Supple, no elevated JVP or carotid bruits, no thyromegaly. Lungs: Clear to auscultation, nonlabored breathing at rest. Cardiac: Regular rate and rhythm, no S3 or significant systolic murmur, no pericardial rub. Abdomen: Soft, nontender, no hepatomegaly, bowel sounds present, no guarding or rebound. Extremities: No pitting edema, distal pulses 2+. Skin: Warm and dry. Musculoskeletal: No kyphosis. Neuropsychiatric: Alert and oriented x3, affect grossly appropriate.  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  No DOE.  CT cardiac was performed due  to significant PVC burden of 22.9% and a new onset cardiomyopathy, to rule out ischemia.  Echocardiogram showed LVEF 40 to 45%, new onset.  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  Cardiac risk factors include HTN, DM 2.  Start aspirin 81 mg once daily, continue atorvastatin  20 mg nightly.  Informed consent for LHC Risks and benefits of cardiac catheterization have been discussed with the patient.  These include bleeding, infection, kidney damage, stroke, heart attack, death.  The patient understands these risks and is willing to proceed.  Frequent PVCs, symptomatic: Symptoms improved after starting amiodarone but he is worried that  his heart rates are in the low 50s.  EKG today showed NSR, occasional PVCs and HR 58 bpm.  Event monitor showed 22.9% PVC burden, 69 runs of NSVT (fastest lasting 10 beats and the longest lasting 18 beats).  Tolerating amiodarone well, continue amiodarone 200 mg twice daily and decrease the dose of metoprolol  tartrate from 25 mg to 12.5 mg twice daily.  He underwent home sleep study but that was inconclusive.  Will go ahead and order split-night sleep study to rule out sleep apnea.  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  Continue metoprolol  tartrate 12.5 mg twice daily (dose decreased from 25 mg) and continue lisinopril  5 mg once daily.  Soft blood pressures, will uptitrate GDMT after LHC.  HLD, unknown values: Continue atorvastatin  20 mg nightly, goal LDL will be less than 55 (due to extensive total plaque volume).  LDL 71 in 2022.  Obtain lipid panel report from the PCP, based on the lipid panel report, will need to uptitrate atorvastatin  dose.  HTN, controlled: Continue current medications, lisinopril  5 mg once daily and metoprolol  as stated above.   DM 2, on metformin: Follow with PCP.        Medication  Adjustments/Labs and Tests Ordered: Current medicines are reviewed at length with the patient today.  Concerns regarding medicines are outlined above.    Disposition:  Follow up 2 months  Signed Stone Spirito Priya Miles Leyda, MD, 10/04/2023 12:54 PM    [facility] at [facility] [address] [place], [place], [place] [address]

## 2023-10-05 ENCOUNTER — Telehealth: Payer: Self-pay | Admitting: Internal Medicine

## 2023-10-05 LAB — BASIC METABOLIC PANEL WITH GFR
BUN/Creatinine Ratio: 22 (ref 10–24)
BUN: 19 mg/dL (ref 8–27)
CO2: 20 mmol/L (ref 20–29)
Calcium: 8.8 mg/dL (ref 8.6–10.2)
Chloride: 101 mmol/L (ref 96–106)
Creatinine, Ser: 0.85 mg/dL (ref 0.76–1.27)
Glucose: 110 mg/dL — ABNORMAL HIGH (ref 70–99)
Potassium: 4.3 mmol/L (ref 3.5–5.2)
Sodium: 139 mmol/L (ref 134–144)
eGFR: 95 mL/min/{1.73_m2} (ref >59)

## 2023-10-05 LAB — CBC
Hematocrit: 44.7 % (ref 37.5–51.0)
Hemoglobin: 14.6 g/dL (ref 13.0–17.7)
MCH: 31.6 pg (ref 26.6–33.0)
MCHC: 32.7 g/dL (ref 31.5–35.7)
MCV: 97 fL (ref 79–97)
Platelets: 194 10*3/uL (ref 150–450)
RBC: 4.62 x10E6/uL (ref 4.14–5.80)
RDW: 14.5 % (ref 11.6–15.4)
WBC: 8.8 10*3/uL (ref 3.4–10.8)

## 2023-10-05 NOTE — Telephone Encounter (Signed)
 FYI - Pt called in stating him and his wife were traveling and he started feeling lightheaded and dizzy. He states pulled over just to be safe.   Pt currently in [place] [facility]. Unable to see anything, they do not use Care Everywhere.

## 2023-10-05 NOTE — Telephone Encounter (Signed)
 Left detailed voice message that I would be in office till 5pm today if they have a fax number for me to fax most recent visit with Dr. Mallipeddi to them.  Otherwise, if they need any notes, they will need to send fax to request next week.

## 2023-10-09 ENCOUNTER — Telehealth: Payer: Self-pay | Admitting: Internal Medicine

## 2023-10-09 NOTE — Telephone Encounter (Signed)
 Informed patient that his instructions are attached to his AVS.  States he has those, but was just making sure on the timing as he usually sees future appointments / procedures in his mychart.   The cath was there but no times.  Not sure why this appears this way in epic.

## 2023-10-09 NOTE — Telephone Encounter (Signed)
 Patient is calling for instructions for his cath on 10/11/23

## 2023-10-10 ENCOUNTER — Telehealth: Payer: Self-pay | Admitting: *Deleted

## 2023-10-10 NOTE — Telephone Encounter (Signed)
 Cardiac Catheterization scheduled at [facility] for: Wednesday October 11, 2023 2:30 PM Arrival time [facility] Main Entrance A at: 12 :30 PM  Nothing to eat after midnight prior to procedure, clear liquids until 5 AM day of procedure.  Medication instructions: -Hold:  Metformin-day of procedure and 48 hours post procedure -Other usual morning medications can be taken with sips of water  including aspirin  81 mg.  Plan to go home the same day, you will only stay overnight if medically necessary.  You must have responsible adult to drive you home.  Someone must be with you the first 24 hours after you arrive home.  Reviewed procedure instructions with patient.

## 2023-10-11 ENCOUNTER — Encounter (HOSPITAL_COMMUNITY): Payer: Self-pay | Admitting: Cardiovascular Disease

## 2023-10-11 ENCOUNTER — Ambulatory Visit (HOSPITAL_COMMUNITY)
Admission: RE | Admit: 2023-10-11 | Discharge: 2023-10-12 | Disposition: A | Discharge status: Home / Self Care | Attending: Cardiovascular Disease | Admitting: Cardiovascular Disease

## 2023-10-11 ENCOUNTER — Encounter (HOSPITAL_COMMUNITY)
Admission: RE | Disposition: A | Discharge status: Home / Self Care | Payer: Self-pay | Source: Home / Self Care | Attending: Cardiovascular Disease

## 2023-10-11 ENCOUNTER — Other Ambulatory Visit: Payer: Self-pay

## 2023-10-11 DIAGNOSIS — I25110 Atherosclerotic heart disease of native coronary artery with unstable angina pectoris: Secondary | ICD-10-CM

## 2023-10-11 DIAGNOSIS — Z7902 Long term (current) use of antithrombotics/antiplatelets: Secondary | ICD-10-CM | POA: Diagnosis not present

## 2023-10-11 DIAGNOSIS — Z8249 Family history of ischemic heart disease and other diseases of the circulatory system: Secondary | ICD-10-CM | POA: Insufficient documentation

## 2023-10-11 DIAGNOSIS — Z79899 Other long term (current) drug therapy: Secondary | ICD-10-CM | POA: Insufficient documentation

## 2023-10-11 DIAGNOSIS — I2584 Coronary atherosclerosis due to calcified coronary lesion: Secondary | ICD-10-CM | POA: Insufficient documentation

## 2023-10-11 DIAGNOSIS — I110 Hypertensive heart disease with heart failure: Secondary | ICD-10-CM | POA: Insufficient documentation

## 2023-10-11 DIAGNOSIS — Z7982 Long term (current) use of aspirin: Secondary | ICD-10-CM | POA: Insufficient documentation

## 2023-10-11 DIAGNOSIS — Z955 Presence of coronary angioplasty implant and graft: Secondary | ICD-10-CM

## 2023-10-11 DIAGNOSIS — I200 Unstable angina: Secondary | ICD-10-CM | POA: Diagnosis present

## 2023-10-11 DIAGNOSIS — I10 Essential (primary) hypertension: Secondary | ICD-10-CM | POA: Diagnosis present

## 2023-10-11 DIAGNOSIS — I5022 Chronic systolic (congestive) heart failure: Secondary | ICD-10-CM | POA: Diagnosis not present

## 2023-10-11 DIAGNOSIS — I472 Ventricular tachycardia, unspecified: Secondary | ICD-10-CM | POA: Insufficient documentation

## 2023-10-11 DIAGNOSIS — E785 Hyperlipidemia, unspecified: Secondary | ICD-10-CM | POA: Diagnosis not present

## 2023-10-11 DIAGNOSIS — I4729 Other ventricular tachycardia: Secondary | ICD-10-CM | POA: Insufficient documentation

## 2023-10-11 DIAGNOSIS — I493 Ventricular premature depolarization: Secondary | ICD-10-CM | POA: Insufficient documentation

## 2023-10-11 HISTORY — PX: LEFT HEART CATH AND CORONARY ANGIOGRAPHY: CATH118249

## 2023-10-11 HISTORY — PX: CORONARY STENT INTERVENTION: CATH118234

## 2023-10-11 LAB — GLUCOSE, CAPILLARY: Glucose-Capillary: 98 mg/dL (ref 70–99)

## 2023-10-11 LAB — POCT ACTIVATED CLOTTING TIME: Activated Clotting Time: 279 s

## 2023-10-11 SURGERY — LEFT HEART CATH AND CORONARY ANGIOGRAPHY
Anesthesia: LOCAL

## 2023-10-11 MED ORDER — SODIUM CHLORIDE 0.9 % IV SOLN: INTRAVENOUS | Status: AC

## 2023-10-11 MED ORDER — FENTANYL CITRATE (PF) 100 MCG/2ML IJ SOLN
INTRAMUSCULAR | Status: DC | PRN
  Administered 2023-10-11 (×2): 50 ug via INTRAVENOUS
  Administered 2023-10-11: 25 ug via INTRAVENOUS

## 2023-10-11 MED ORDER — HEPARIN SODIUM (PORCINE) 1000 UNIT/ML IJ SOLN
INTRAMUSCULAR | Status: AC
  Filled 2023-10-11: qty 10

## 2023-10-11 MED ORDER — CLOPIDOGREL BISULFATE 300 MG PO TABS
ORAL_TABLET | ORAL | Status: AC
  Filled 2023-10-11: qty 2

## 2023-10-11 MED ORDER — SODIUM CHLORIDE 0.9 % IV SOLN
INTRAVENOUS | Status: DC | PRN
  Administered 2023-10-11: 10 mL/h via INTRAVENOUS

## 2023-10-11 MED ORDER — MIDAZOLAM HCL 2 MG/2ML IJ SOLN
INTRAMUSCULAR | Status: AC
  Filled 2023-10-11: qty 2

## 2023-10-11 MED ORDER — ACETAMINOPHEN 325 MG PO TABS
650.0000 mg | ORAL_TABLET | ORAL | Status: DC | PRN
  Administered 2023-10-11: 650 mg via ORAL
  Filled 2023-10-11: qty 2

## 2023-10-11 MED ORDER — SODIUM CHLORIDE 0.9% FLUSH: 3.0000 mL | INTRAVENOUS | Status: DC | PRN

## 2023-10-11 MED ORDER — ASPIRIN 81 MG PO CHEW: 81.0000 mg | CHEWABLE_TABLET | ORAL | Status: DC

## 2023-10-11 MED ORDER — CLOPIDOGREL BISULFATE 75 MG PO TABS
75.0000 mg | ORAL_TABLET | Freq: Every day | ORAL | Status: DC
  Administered 2023-10-12: 75 mg via ORAL
  Filled 2023-10-11: qty 1

## 2023-10-11 MED ORDER — IOHEXOL 350 MG/ML SOLN
INTRAVENOUS | Status: DC | PRN
  Administered 2023-10-11: 100 mL

## 2023-10-11 MED ORDER — FENTANYL CITRATE (PF) 100 MCG/2ML IJ SOLN
INTRAMUSCULAR | Status: AC
Start: 2023-10-11 — End: 2023-10-11
  Filled 2023-10-11: qty 2

## 2023-10-11 MED ORDER — ONDANSETRON HCL 4 MG/2ML IJ SOLN: 4.0000 mg | Freq: Four times a day (QID) | INTRAMUSCULAR | Status: DC | PRN

## 2023-10-11 MED ORDER — FAMOTIDINE IN NACL 20-0.9 MG/50ML-% IV SOLN
INTRAVENOUS | Status: DC | PRN
  Administered 2023-10-11: 20 mg via INTRAVENOUS

## 2023-10-11 MED ORDER — ASPIRIN 81 MG PO TBEC: 81.0000 mg | DELAYED_RELEASE_TABLET | Freq: Every day | ORAL | Status: DC

## 2023-10-11 MED ORDER — MIDAZOLAM HCL 2 MG/2ML IJ SOLN
INTRAMUSCULAR | Status: DC | PRN
  Administered 2023-10-11 (×2): 1 mg via INTRAVENOUS
  Administered 2023-10-11: 2 mg via INTRAVENOUS

## 2023-10-11 MED ORDER — VERAPAMIL HCL 2.5 MG/ML IV SOLN
INTRAVENOUS | Status: DC | PRN
  Administered 2023-10-11: 10 mL via INTRA_ARTERIAL

## 2023-10-11 MED ORDER — HYDRALAZINE HCL 20 MG/ML IJ SOLN: 10.0000 mg | INTRAMUSCULAR | Status: AC | PRN

## 2023-10-11 MED ORDER — LIDOCAINE HCL (PF) 1 % IJ SOLN
INTRAMUSCULAR | Status: AC
  Filled 2023-10-11: qty 30

## 2023-10-11 MED ORDER — ATORVASTATIN CALCIUM 80 MG PO TABS
80.0000 mg | ORAL_TABLET | Freq: Every day | ORAL | Status: DC
  Administered 2023-10-11: 80 mg via ORAL
  Filled 2023-10-11: qty 1

## 2023-10-11 MED ORDER — AMIODARONE HCL 200 MG PO TABS
200.0000 mg | ORAL_TABLET | Freq: Two times a day (BID) | ORAL | Status: DC
  Administered 2023-10-11 – 2023-10-12 (×2): 200 mg via ORAL
  Filled 2023-10-11 (×2): qty 1

## 2023-10-11 MED ORDER — LABETALOL HCL 5 MG/ML IV SOLN: 10.0000 mg | INTRAVENOUS | Status: AC | PRN

## 2023-10-11 MED ORDER — METOPROLOL TARTRATE 12.5 MG HALF TABLET
12.5000 mg | ORAL_TABLET | Freq: Two times a day (BID) | ORAL | Status: DC
  Administered 2023-10-11 – 2023-10-12 (×2): 12.5 mg via ORAL
  Filled 2023-10-11 (×2): qty 1

## 2023-10-11 MED ORDER — CLOPIDOGREL BISULFATE 300 MG PO TABS
ORAL_TABLET | ORAL | Status: DC | PRN
  Administered 2023-10-11: 600 mg via ORAL

## 2023-10-11 MED ORDER — FENTANYL CITRATE (PF) 100 MCG/2ML IJ SOLN
INTRAMUSCULAR | Status: AC
  Filled 2023-10-11: qty 2

## 2023-10-11 MED ORDER — LIDOCAINE HCL (PF) 1 % IJ SOLN
INTRAMUSCULAR | Status: DC | PRN
  Administered 2023-10-11: 2 mL
  Administered 2023-10-11: 5 mL

## 2023-10-11 MED ORDER — FAMOTIDINE IN NACL 20-0.9 MG/50ML-% IV SOLN
INTRAVENOUS | Status: AC
  Filled 2023-10-11: qty 50

## 2023-10-11 MED ORDER — SODIUM CHLORIDE 0.9 % IV SOLN: INTRAVENOUS | Status: DC

## 2023-10-11 MED ORDER — HEPARIN (PORCINE) IN NACL 1000-0.9 UT/500ML-% IV SOLN
INTRAVENOUS | Status: DC | PRN
  Administered 2023-10-11: 1000 mL

## 2023-10-11 MED ORDER — HEPARIN SODIUM (PORCINE) 1000 UNIT/ML IJ SOLN
INTRAMUSCULAR | Status: DC | PRN
  Administered 2023-10-11: 7000 [IU] via INTRAVENOUS
  Administered 2023-10-11: 5000 [IU] via INTRAVENOUS

## 2023-10-11 MED ORDER — LISINOPRIL 10 MG PO TABS
5.0000 mg | ORAL_TABLET | Freq: Every day | ORAL | Status: DC
  Administered 2023-10-11: 5 mg via ORAL
  Filled 2023-10-11: qty 1

## 2023-10-11 MED ORDER — SODIUM CHLORIDE 0.9% FLUSH
3.0000 mL | Freq: Two times a day (BID) | INTRAVENOUS | Status: DC
  Administered 2023-10-11 – 2023-10-12 (×2): 3 mL via INTRAVENOUS

## 2023-10-11 MED ORDER — SODIUM CHLORIDE 0.9 % IV SOLN: 250.0000 mL | INTRAVENOUS | Status: DC | PRN

## 2023-10-11 MED ORDER — VERAPAMIL HCL 2.5 MG/ML IV SOLN
INTRAVENOUS | Status: AC
  Filled 2023-10-11: qty 2

## 2023-10-11 SURGICAL SUPPLY — 17 items
BALLOON EMERGE MR 2.5X20 (BALLOONS) IMPLANT
BALLOON SAPPHIRE NC24 3.25X15 (BALLOONS) IMPLANT
CATH 5FR JL3.5 JR4 ANG PIG MP (CATHETERS) IMPLANT
CATH VISTA GUIDE 6FR XBLD 3.5 (CATHETERS) IMPLANT
CLOSURE PERCLOSE PROSTYLE (VASCULAR PRODUCTS) IMPLANT
DEVICE RAD COMP TR BAND LRG (VASCULAR PRODUCTS) IMPLANT
GUIDEWIRE INQWIRE 1.5J.035X260 (WIRE) IMPLANT
KIT ENCORE 26 ADVANTAGE (KITS) IMPLANT
KIT MICROPUNCTURE NIT STIFF (SHEATH) IMPLANT
KIT SYRINGE INJ CVI SPIKEX1 (MISCELLANEOUS) IMPLANT
PACK CARDIAC CATHETERIZATION (CUSTOM PROCEDURE TRAY) ×1 IMPLANT
SET ATX-X65L (MISCELLANEOUS) IMPLANT
SHEATH 6FR 85 DEST SLENDER (SHEATH) IMPLANT
SHEATH PINNACLE 6F 10CM (SHEATH) IMPLANT
SHEATH PROBE COVER 6X72 (BAG) IMPLANT
STENT SYNERGY XD 3.0X28 (Permanent Stent) IMPLANT
WIRE ASAHI PROWATER 180CM (WIRE) IMPLANT

## 2023-10-11 NOTE — Progress Notes (Signed)
 Patient arrived to room 6E20. VS stable. Patient oriented to room and call bell in reach. Right groin and wrist stable. Wife and daughter at bedside.

## 2023-10-11 NOTE — Progress Notes (Signed)
 Dr Dora MD is in route to pt room.

## 2023-10-11 NOTE — Interval H&P Note (Signed)
 History and Physical Interval Note:  10/11/2023 2:10 PM  Thomas Summers  has presented today for surgery, with the diagnosis of positive cta.  The various methods of treatment have been discussed with the patient and family. After consideration of risks, benefits and other options for treatment, the patient has consented to  Procedure(s): LEFT HEART CATH AND CORONARY ANGIOGRAPHY (N/A) as a surgical intervention.  The patient's history has been reviewed, patient examined, no change in status, stable for surgery.  I have reviewed the patient's chart and labs.  Questions were answered to the patient's satisfaction.    Cath Lab Visit (complete for each Cath Lab visit)  Clinical Evaluation Leading to the Procedure:   ACS: No.  Non-ACS:    Anginal Classification: CCS II  Anti-ischemic medical therapy: Minimal Therapy (1 class of medications)  Non-Invasive Test Results: High-risk stress test findings: cardiac mortality >3%/year (High grade LAD and OM lesions on Coronary CTA)  Prior CABG: No previous CABG        Lonni Cash

## 2023-10-11 NOTE — Progress Notes (Addendum)
 Pt groin site is saturated with serous sanguineous drainage. Prior to assessment, patient was at rest since last saturation.during assessment site was level 0: no hematoma, redness and pain present. See prior notes for more information. Carilse MD notified.

## 2023-10-11 NOTE — Progress Notes (Signed)
 Upon checking groin site, I found it to be saturated. Manual pressure applied and cath lab called to bedside. Asberry came to assess and held pressure for 10 minutes and applied clot gauze to site. She will notify MD. Site has remained stable with no further oozing and will continue to monitor.

## 2023-10-12 ENCOUNTER — Other Ambulatory Visit (HOSPITAL_COMMUNITY): Payer: Self-pay

## 2023-10-12 ENCOUNTER — Encounter (HOSPITAL_COMMUNITY): Payer: Self-pay | Admitting: Cardiovascular Disease

## 2023-10-12 ENCOUNTER — Telehealth (HOSPITAL_COMMUNITY): Payer: Self-pay | Admitting: Pharmacy Technician

## 2023-10-12 DIAGNOSIS — I5022 Chronic systolic (congestive) heart failure: Secondary | ICD-10-CM | POA: Diagnosis not present

## 2023-10-12 DIAGNOSIS — I200 Unstable angina: Secondary | ICD-10-CM | POA: Diagnosis not present

## 2023-10-12 DIAGNOSIS — I110 Hypertensive heart disease with heart failure: Secondary | ICD-10-CM | POA: Diagnosis not present

## 2023-10-12 DIAGNOSIS — I2584 Coronary atherosclerosis due to calcified coronary lesion: Secondary | ICD-10-CM | POA: Diagnosis not present

## 2023-10-12 DIAGNOSIS — I25110 Atherosclerotic heart disease of native coronary artery with unstable angina pectoris: Secondary | ICD-10-CM | POA: Diagnosis not present

## 2023-10-12 DIAGNOSIS — I4729 Other ventricular tachycardia: Secondary | ICD-10-CM | POA: Insufficient documentation

## 2023-10-12 LAB — BASIC METABOLIC PANEL WITH GFR
Anion gap: 7 (ref 5–15)
BUN: 18 mg/dL (ref 8–23)
CO2: 23 mmol/L (ref 22–32)
Calcium: 8.5 mg/dL — ABNORMAL LOW (ref 8.9–10.3)
Chloride: 106 mmol/L (ref 98–111)
Creatinine, Ser: 0.97 mg/dL (ref 0.61–1.24)
GFR, Estimated: >60 mL/min (ref >60)
Glucose, Bld: 95 mg/dL (ref 70–99)
Potassium: 4.2 mmol/L (ref 3.5–5.1)
Sodium: 136 mmol/L (ref 135–145)

## 2023-10-12 LAB — CBC
HCT: 41 % (ref 39.0–52.0)
Hemoglobin: 13.7 g/dL (ref 13.0–17.0)
MCH: 31.7 pg (ref 26.0–34.0)
MCHC: 33.4 g/dL (ref 30.0–36.0)
MCV: 94.9 fL (ref 80.0–100.0)
Platelets: 171 K/uL (ref 150–400)
RBC: 4.32 MIL/uL (ref 4.22–5.81)
RDW: 15.5 % (ref 11.5–15.5)
WBC: 10 K/uL (ref 4.0–10.5)
nRBC: 0 % (ref 0.0–0.2)

## 2023-10-12 LAB — GLUCOSE, CAPILLARY: Glucose-Capillary: 88 mg/dL (ref 70–99)

## 2023-10-12 LAB — HEMOGLOBIN A1C
Hgb A1c MFr Bld: 5.6 % (ref 4.8–5.6)
Mean Plasma Glucose: 114 mg/dL

## 2023-10-12 LAB — LIPID PANEL
Cholesterol: 109 mg/dL (ref 0–200)
HDL: 49 mg/dL (ref >40)
LDL Cholesterol: 47 mg/dL (ref 0–99)
Total CHOL/HDL Ratio: 2.2 ratio
Triglycerides: 66 mg/dL (ref <150)
VLDL: 13 mg/dL (ref 0–40)

## 2023-10-12 MED ORDER — ATORVASTATIN CALCIUM 80 MG PO TABS
80.0000 mg | ORAL_TABLET | Freq: Every day | ORAL | 3 refills | Status: DC
Start: 2023-10-12 — End: 2023-12-25
  Filled 2023-10-12: qty 90, 90d supply, fill #0

## 2023-10-12 MED ORDER — LOSARTAN POTASSIUM 25 MG PO TABS
25.0000 mg | ORAL_TABLET | Freq: Every day | ORAL | 11 refills | Status: DC
  Filled 2023-10-12: qty 30, 30d supply, fill #0

## 2023-10-12 MED ORDER — LIDOCAINE-EPINEPHRINE (PF) 2 %-1:200000 IJ SOLN
INTRAMUSCULAR | Status: AC
  Filled 2023-10-12: qty 20

## 2023-10-12 MED ORDER — METOPROLOL SUCCINATE ER 25 MG PO TB24
25.0000 mg | ORAL_TABLET | Freq: Every day | ORAL | 1 refills | Status: DC
  Filled 2023-10-12: qty 90, 90d supply, fill #0

## 2023-10-12 MED ORDER — NITROGLYCERIN 0.4 MG SL SUBL
0.4000 mg | SUBLINGUAL_TABLET | SUBLINGUAL | 3 refills | Status: AC | PRN
  Filled 2023-10-12: qty 25, 8d supply, fill #0

## 2023-10-12 MED ORDER — LOSARTAN POTASSIUM 25 MG PO TABS
25.0000 mg | ORAL_TABLET | Freq: Every day | ORAL | Status: DC
  Administered 2023-10-12: 25 mg via ORAL
  Filled 2023-10-12: qty 1

## 2023-10-12 MED ORDER — CLOPIDOGREL BISULFATE 75 MG PO TABS
75.0000 mg | ORAL_TABLET | Freq: Every day | ORAL | 3 refills | Status: DC
  Filled 2023-10-12: qty 90, 90d supply, fill #0

## 2023-10-12 NOTE — Discharge Summary (Addendum)
 Discharge Summary   Patient ID: Thomas Summers MRN: [id number]; DOB: 1955/07/21  Admit date: 10/11/2023 Discharge date: 10/12/2023  PCP:  Alston Silvio BROCKS, FNP   [facility] Providers Cardiologist:  Vishnu P Mallipeddi, MD     Discharge Diagnoses  Principal Problem:   Unstable angina [facility]) Active Problems:   Essential hypertension   Hyperlipidemia   Frequent PVCs   Heart failure with mildly reduced ejection fraction (HFmrEF) (HCC)   NSVT (nonsustained ventricular tachycardia) (HCC)   Diagnostic Studies/Procedures   Cath: 10/11/2023    Mid RCA lesion is 40% stenosed.   Mid Cx lesion is 30% stenosed.   Ost Cx lesion is 30% stenosed.   Ost LAD to Mid LAD lesion is 40% stenosed.   Mid LAD lesion is 80% stenosed.   2nd Mrg lesion is 99% stenosed.   A drug-eluting stent was successfully placed using a STENT SYNERGY XD 3.0X28.   Post intervention, there is a 0% residual stenosis.   The LAD has moderate proximal stenosis with calcification. There is a severe, calcified stenosis in the mid vessel. The Diagonal is small and has moderate stenosis The Circumflex has mild disease in the distal vessel. The small caliber obtuse marginal branch has a severe stenosis (vessel is too small for PCI).  The RCA is a large dominant vessel with mild mid vessel stenosis Successful PTCA/DES x 1 mid LAD   Recommendations: Will continue DAPT with ASA and Plavix  for one year. Continue statin. Monitor overnight given late finish time of case and groin access.    _____________   History of Present Illness   Thomas Summers is a 68 y.o. male with past medical history of hypertension, diabetes, HFmrEF, hyperlipidemia who is followed by Dr. Mallipeddi as an outpatient.  Initially referred to cardiology for evaluation of elevated proBNP but did not have symptoms of dyspnea on exertion.  Did not report dizziness and wore a cardiac monitor which showed 22.9% PVC burden with 69 runs of NSVT.   He was started on metoprolol  25 mg twice daily and eventually amiodarone  200 mg twice daily.  Echocardiogram showed LVEF of 40 to 45%, no valvular disease.  Underwent cardiac CT which showed elevated coronary calcium  score of 3167 which was 97 percentile with concern for severe flow-limiting stenosis of the proximal/mid LAD and OM1.  Seen back in the office on 7/2 and cardiac catheterization was discussed given his cardiac CT findings.   Hospital Course    CAD -- Underwent cardiac catheterization 7/9 with severe calcified mid LAD stenosis treated with PCI/DES x 1.  Also severe 99% small OM 2 to be treated medically given small caliber vessel.  Recommendations for DAPT with aspirin /Plavix  for at least 1 year.  No recurrent chest pain overnight, seen by cardiac rehab. -- Continue aspirin , Plavix , atorvastatin   HFmrEF -- Outpatient echocardiogram with LVEF of 40 to 45% -- Will consolidate to Toprol  XL 25 mg daily at discharge as well as transition from lisinopril  to losartan  25mg  daily with consideration for transition to Entresto as an outpatient.  Consider addition of SGLT2 at outpatient follow-up  PVCs NSVT -- Occasional PVCs noted on telemetry during admission -- Continue metoprolol  as well as amiodarone  200 mg twice daily  Hyperlipidemia -- Lipid panel pending at time of discharge -- Atorvastatin  increased to 80 mg daily -- Need LFT/FLP in 8 weeks  General: Well developed, well nourished, male appearing in no acute distress. Head: Normocephalic, atraumatic.  Neck: Supple without bruits, JVD. Lungs:  Resp regular and unlabored, CTA. Heart: RRR, S1, S2, no S3, S4, or murmur; no rub. Abdomen: Soft, non-tender, non-distended with normoactive bowel sounds. Extremities: No clubbing, cyanosis, edema. Distal pedal pulses are 2+ bilaterally. Right radial/femoral cath site stable with mild bruising but no hematoma Neuro: Alert and oriented X 3. Moves all extremities spontaneously. Psych:  Normal affect.  Patient was seen by myself and Dr. Wonda and deemed stable for discharge home.  Follow-up arranged in the office.  Medication sent to [facility] pharmacy.  Educated by [employer].D. prior to discharge.  Did the patient have an acute coronary syndrome (MI, NSTEMI, STEMI, etc) this admission?:  No                               Did the patient have a percutaneous coronary intervention (stent / angioplasty)?:  Yes.     Cath/PCI Registry Performance & Quality Measures: Aspirin  prescribed? - Yes ADP Receptor Inhibitor (Plavix /Clopidogrel , Brilinta/Ticagrelor or Effient/Prasugrel) prescribed (includes medically managed patients)? - Yes High Intensity Statin (Lipitor 40-80mg  or Crestor 20-40mg ) prescribed? - Yes For EF <40%, was ACEI/ARB prescribed? - Yes For EF <40%, Aldosterone Antagonist (Spironolactone or Eplerenone) prescribed? - Not Applicable (EF >/= 40%) Cardiac Rehab Phase II ordered? - Yes      _____________  Discharge Vitals Blood pressure 130/85, pulse 64, temperature 97.6 F (36.4 C), temperature source Oral, resp. rate 18, height 6' 1 (1.854 m), weight 90.7 kg, SpO2 95%.  Filed Weights   10/11/23 1300  Weight: 90.7 kg    Labs & Radiologic Studies  CBC Recent Labs    10/12/23 0419  WBC 10.0  HGB 13.7  HCT 41.0  MCV 94.9  PLT 171   Basic Metabolic Panel Recent Labs    92/89/74 0419  NA 136  K 4.2  CL 106  CO2 23  GLUCOSE 95  BUN 18  CREATININE 0.97  CALCIUM  8.5*   Liver Function Tests No results for input(s): AST, ALT, ALKPHOS, BILITOT, PROT, ALBUMIN in the last 72 hours. No results for input(s): LIPASE, AMYLASE in the last 72 hours. High Sensitivity Troponin:   No results for input(s): TROPONINIHS in the last 720 hours.  No results for input(s): TRNPT in the last 720 hours.  BNP Invalid input(s): POCBNP No results for input(s): PROBNP in the last 72 hours.  No results for input(s): BNP in the last 72 hours.  D-Dimer No  results for input(s): DDIMER in the last 72 hours. Hemoglobin A1C No results for input(s): HGBA1C in the last 72 hours. Fasting Lipid Panel No results for input(s): CHOL, HDL, LDLCALC, TRIG, CHOLHDL, LDLDIRECT in the last 72 hours. No results found for: LIPOA  Thyroid Function Tests No results for input(s): TSH, T4TOTAL, T3FREE, THYROIDAB in the last 72 hours.  Invalid input(s): FREET3 _____________  CARDIAC CATHETERIZATION Result Date: 10/11/2023   Mid RCA lesion is 40% stenosed.   Mid Cx lesion is 30% stenosed.   Ost Cx lesion is 30% stenosed.   Ost LAD to Mid LAD lesion is 40% stenosed.   Mid LAD lesion is 80% stenosed.   2nd Mrg lesion is 99% stenosed.   A drug-eluting stent was successfully placed using a STENT SYNERGY XD 3.0X28.   Post intervention, there is a 0% residual stenosis. The LAD has moderate proximal stenosis with calcification. There is a severe, calcified stenosis in the mid vessel. The Diagonal is small and has moderate stenosis The Circumflex has mild  disease in the distal vessel. The small caliber obtuse marginal branch has a severe stenosis (vessel is too small for PCI). The RCA is a large dominant vessel with mild mid vessel stenosis Successful PTCA/DES x 1 mid LAD Recommendations: Will continue DAPT with ASA and Plavix  for one year. Continue statin. Monitor overnight given late finish time of case and groin access.   CT CORONARY MORPH W/CTA COR W/SCORE W/CA W/CM &/OR WO/CM Addendum Date: 10/03/2023 ADDENDUM REPORT: 10/03/2023 17:25 ADDENDUM: OVER-READ INTERPRETATION  CT CHEST The following report is an over-read performed by radiologist Dr. Toribio Faes of [facility]. This over-read does not include interpretation of cardiac or coronary anatomy or pathology; that interpretation by cardiologist is attached. No pleural or pericardial effusion. No mass or adenopathy in visualized mediastinum. Scattered low-attenuation liver lesions,  largest 2.2 cm in the right lobe near the dome, incompletely characterized. Small cysts had been described on prior ultrasound of 01/08/2020. No pneumothorax. Mild dependent atelectasis in the lung bases. Punctate calcified granuloma laterally in the left upper lobe (309:39) visualized lung fields otherwise clear. Mild spondylitic changes in the lower thoracic spine. IMPRESSION: 1. No acute findings. 2. Scattered low-attenuation liver lesions, incompletely characterized. Electronically Signed   By: JONETTA Faes M.D.   On: 10/03/2023 17:25   Result Date: 10/03/2023 CLINICAL DATA:  68 yo male, evaluate for ischemia EXAM: Cardiac/Coronary CTA TECHNIQUE: A non-contrast, gated CT scan was obtained with axial slices of 2.5 mm through the heart for calcium  scoring. Calcium  scoring was performed using the Agatston method. A 120 kV prospective, gated, contrast cardiac CT scan was obtained. Gantry rotation speed was 230 msec and collimation was 0.63 mm. Two sublingual nitroglycerin  tablets (0.8 mg) were given. The 3D data set was reconstructed with motion correction for the best systolic or diastolic phase. Images were analyzed on a dedicated workstation using MPR, MIP, and VRT modes. The patient received 100mL OMNIPAQUE  IOHEXOL  350 MG/ML SOLN of contrast. FINDINGS: Image quality: Excellent. Noise artifact is: Limited. Coronary arteries: Normal coronary origins.  Right dominance. Right Coronary Artery: Dominant. Mild proximal 25-49% mixed stenosis. There is moderate 50-69% distal mixed stenosis. The R-PLB and R-PDA branches are not well-visualized. Left Main Coronary Artery: Normal. Bifurcates into the LAD and LCx arteries. Left Anterior Descending Coronary Artery: Large anterior artery that wraps around the apex. Heavy diffuse calcification of the LAD with probably severe 70-99% proximal to mid-vessel stenosis. Large D1 branch with mild to moderate 50-69% mixed stenosis. Left Circumflex Artery: AV groove vessel, with mild  proximal 25-49% mixed stenosis and moderate 50-69% mid-vessel stenosis. 2 small OM branches without disease. Aorta: Normal size, 32 mm at the mid ascending aorta (level of the PA bifurcation) measured double oblique. Aortic atherosclerosis. No dissection. Aortic Valve: Trileaflet. No calcifications. Other findings: Normal pulmonary vein drainage into the left atrium. Normal left atrial appendage without a thrombus. Dilated main pulmonary artery to 32 mm, suggestive of possible pulmonary hypertension. Extra-cardiac findings: See attached radiology report for non-cardiac structures. IMPRESSION: 1. 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 CT FFR will be performed and reported separately. 2. Normal coronary origin with right dominance. 3. Coronary artery calcium  score is 3167, which places the patient in the 97th percentile for age/race and sex-matched controls (MESA). 4. Total plaque volume 2713 mm3, of which 991 mm3 (37%) is calcified plaque and 1769mm3 (63%) is non-calcified plaque. TPV is extensive. 5. Aortic atherosclerosis. 6. Dilated main pulmonary artery to 32 mm, suggestive of  possible pulmonary hypertension. 7. Aggressive lipid-lowering therapy to target LDL <50. Consider evaluation for inflammation with HS-CRP and possibly starting colchicine or icosapent ethyl therapies for inflammation. 8. Definitive cardiac catheterization may be warranted. RECOMMENDATIONS: 1. CAD-RADS 0: No evidence of CAD (0%). Consider non-atherosclerotic causes of chest pain. 2. CAD-RADS 1: Minimal non-obstructive CAD (0-24%). Consider non-atherosclerotic causes of chest pain. Consider preventive therapy and risk factor modification. 3. CAD-RADS 2: Mild non-obstructive CAD (25-49%). Consider non-atherosclerotic causes of chest pain. Consider preventive therapy and risk factor modification. 4. CAD-RADS 3: Moderate stenosis. Consider symptom-guided anti-ischemic pharmacotherapy as well as risk  factor modification per guideline directed care. Additional analysis with CT FFR will be submitted. 5. CAD-RADS 4: Severe stenosis. (70-99% or > 50% left main). Cardiac catheterization or CT FFR is recommended. Consider symptom-guided anti-ischemic pharmacotherapy as well as risk factor modification per guideline directed care. Invasive coronary angiography recommended with revascularization per published guideline statements. 6. CAD-RADS 5: Total coronary occlusion (100%). Consider cardiac catheterization or viability assessment. Consider symptom-guided anti-ischemic pharmacotherapy as well as risk factor modification per guideline directed care. 7. CAD-RADS N: Non-diagnostic study. Obstructive CAD can't be excluded. Alternative evaluation is recommended. Electronically Signed: By: Vinie JAYSON Maxcy M.D. On: 10/03/2023 09:26   CT CORONARY FRACTIONAL FLOW RESERVE FLUID ANALYSIS Result Date: 10/03/2023 EXAM: CT FFR ANALYSIS CLINICAL DATA:  doe FINDINGS: FFRct analysis was performed on the original cardiac CT angiogram dataset. Diagrammatic representation of the FFRct analysis is provided in a separate PDF document in PACS. This dictation was created using the PDF document and an interactive 3D model of the results. 3D model is not available in the EMR/PACS. Normal FFR range is >0.80. 1. Left Main:  No significant stenosis. FFR = 0.99 2. LAD: Significant stenosis. Proximal FFR = 0.93, Mid FFR = 0.69, Distal FFR = 0.53 3. LCX: Significant stenosis. Proximal FFR =0.98, Distal FFR = 0.86, OM1 branch proximal FFR = 0.97, OM1 branch distal FFR = 0.72 4. RCA: No significant stenosis. Proximal FFR = 1.00, Mid FFR = 0.94, Distal FFR = 0.76 IMPRESSION: 1. CT FFR analysis does show significant discrete stenosis (<0.75) of the proximal to mid-LAD and the OM1 branch. 2.  Consider definitive cardiac catheterization. Electronically Signed   By: Vinie JAYSON Maxcy M.D.   On: 10/03/2023 09:34   ECHOCARDIOGRAM COMPLETE Result Date:  09/14/2023    ECHOCARDIOGRAM REPORT   Patient Name:   Thomas Summers Date of Exam: 09/13/2023 Medical Rec #:  [id number]            Height:       73.0 in Accession #:    [id number]           Weight:       196.0 lb Date of Birth:  1955-07-03            BSA:          2.133 m Patient Age:    67 years             BP:           118/70 mmHg Patient Gender: M                    HR:           74 bpm. Exam Location:  [place] Procedure: 2D Echo, Cardiac Doppler and Color Doppler (Both Spectral and Color            Flow Doppler were utilized during procedure). Indications:  I49.3 PVC  History:        Patient has no prior history of Echocardiogram examinations.                 Arrythmias:PVC; Risk Factors:Sleep Apnea, Hypertension,                 Diabetes, Dyslipidemia and Non-Smoker.  Sonographer:    Summers Burows RCS, RVS Referring Phys: [id number] VISHNU P MALLIPEDDI  Sonographer Comments: Global longitudinal strain was attempted. IMPRESSIONS  1. Left ventricular ejection fraction, by estimation, is 40 to 45%. Left ventricular ejection fraction by 3D volume is 40 %. The left ventricle has mildly decreased function. The left ventricle has no regional wall motion abnormalities. Left ventricular  diastolic parameters are consistent with Grade I diastolic dysfunction (impaired relaxation). The average left ventricular global longitudinal strain is -15.2 %. The global longitudinal strain is abnormal.  2. Right ventricular systolic function is normal. The right ventricular size is normal. There is mildly elevated pulmonary artery systolic pressure.  3. Left atrial size was mildly dilated.  4. The mitral valve is normal in structure. Mild mitral valve regurgitation. No evidence of mitral stenosis.  5. The aortic valve is tricuspid. Aortic valve regurgitation is not visualized. No aortic stenosis is present.  6. The inferior vena cava is dilated in size with >50% respiratory variability, suggesting right atrial pressure of 8 mmHg.  Comparison(s): No prior Echocardiogram. FINDINGS  Left Ventricle: Left ventricular ejection fraction, by estimation, is 40 to 45%. Left ventricular ejection fraction by 3D volume is 40 %. The left ventricle has mildly decreased function. The left ventricle has no regional wall motion abnormalities. The  average left ventricular global longitudinal strain is -15.2 %. Strain was performed and the global longitudinal strain is abnormal. The left ventricular internal cavity size was normal in size. There is no left ventricular hypertrophy. Left ventricular  diastolic parameters are consistent with Grade I diastolic dysfunction (impaired relaxation). Normal left ventricular filling pressure. Right Ventricle: The right ventricular size is normal. No increase in right ventricular wall thickness. Right ventricular systolic function is normal. There is mildly elevated pulmonary artery systolic pressure. The tricuspid regurgitant velocity is 2.74  m/s, and with an assumed right atrial pressure of 8 mmHg, the estimated right ventricular systolic pressure is 38.0 mmHg. Left Atrium: Left atrial size was mildly dilated. Right Atrium: Right atrial size was normal in size. Pericardium: There is no evidence of pericardial effusion. Mitral Valve: The mitral valve is normal in structure. Mild mitral valve regurgitation. No evidence of mitral valve stenosis. MV peak gradient, 4.2 mmHg. The mean mitral valve gradient is 2.0 mmHg. Tricuspid Valve: The tricuspid valve is normal in structure. Tricuspid valve regurgitation is trivial. No evidence of tricuspid stenosis. Aortic Valve: The aortic valve is tricuspid. Aortic valve regurgitation is not visualized. No aortic stenosis is present. Aortic valve mean gradient measures 3.0 mmHg. Aortic valve peak gradient measures 5.4 mmHg. Aortic valve area, by VTI measures 3.10 cm. Pulmonic Valve: The pulmonic valve was normal in structure. Pulmonic valve regurgitation is trivial. No evidence of  pulmonic stenosis. Aorta: The aortic root and ascending aorta are structurally normal, with no evidence of dilitation. Venous: The inferior vena cava is dilated in size with greater than 50% respiratory variability, suggesting right atrial pressure of 8 mmHg. IAS/Shunts: No atrial level shunt detected by color flow Doppler. Additional Comments: 3D was performed not requiring image post processing on an independent workstation and was abnormal.  LEFT VENTRICLE PLAX  2D LVIDd:         4.80 cm         Diastology LVIDs:         3.50 cm         LV e' medial:    7.29 cm/s LV PW:         1.10 cm         LV E/e' medial:  12.8 LV IVS:        1.10 cm         LV e' lateral:   6.85 cm/s LVOT diam:     2.30 cm         LV E/e' lateral: 13.6 LV SV:         79 LV SV Index:   37              2D Longitudinal LVOT Area:     4.15 cm        Strain                                2D Strain GLS   -15.2 %                                Avg: LV Volumes (MOD) LV vol d, MOD    119.0 ml      3D Volume EF A2C:                           LV 3D EF:    Left LV vol d, MOD    136.0 ml                   ventricul A4C:                                        ar LV vol s, MOD    47.5 ml                    ejection A2C:                                        fraction LV vol s, MOD    78.0 ml                    by 3D A4C:                                        volume is LV SV MOD A2C:   71.5 ml                    40 %. LV SV MOD A4C:   136.0 ml LV SV MOD BP:    66.8 ml                                3D Volume EF:  3D EF:        40 % RIGHT VENTRICLE             IVC RV Basal diam:  3.50 cm     IVC diam: 2.80 cm RV Mid diam:    3.80 cm RV S prime:     13.30 cm/s TAPSE (M-mode): 2.5 cm LEFT ATRIUM             Index        RIGHT ATRIUM           Index LA diam:        4.00 cm 1.88 cm/m   RA Area:     20.30 cm LA Vol (A2C):   74.1 ml 34.74 ml/m  RA Volume:   61.20 ml  28.69 ml/m LA Vol (A4C):   65.1 ml 30.52 ml/m LA Biplane Vol:  75.1 ml 35.20 ml/m  AORTIC VALVE                    PULMONIC VALVE AV Area (Vmax):    3.13 cm     PV Vmax:          1.32 m/s AV Area (Vmean):   2.99 cm     PV Peak grad:     6.9 mmHg AV Area (VTI):     3.10 cm     PR End Diast Vel: 3.64 msec AV Vmax:           116.00 cm/s AV Vmean:          85.800 cm/s AV VTI:            0.256 m AV Peak Grad:      5.4 mmHg AV Mean Grad:      3.0 mmHg LVOT Vmax:         87.50 cm/s LVOT Vmean:        61.700 cm/s LVOT VTI:          0.191 m LVOT/AV VTI ratio: 0.75  AORTA Ao Root diam: 3.60 cm Ao Asc diam:  3.30 cm MITRAL VALVE               TRICUSPID VALVE MV Area (PHT): 3.01 cm    TR Peak grad:   30.0 mmHg MV Area VTI:   2.26 cm    TR Vmax:        274.00 cm/s MV Peak grad:  4.2 mmHg MV Mean grad:  2.0 mmHg    SHUNTS MV Vmax:       1.03 m/s    Systemic VTI:  0.19 m MV Vmean:      66.0 cm/s   Systemic Diam: 2.30 cm MV Decel Time: 252 msec MV E velocity: 93.40 cm/s MV A velocity: 77.10 cm/s MV E/A ratio:  1.21 Vishnu Priya Mallipeddi Electronically signed by Diannah Late Mallipeddi Signature Date/Time: 09/14/2023/9:01:20 AM    Final     Disposition Pt is being discharged home today in good condition.  Follow-up Plans & Appointments  Discharge Instructions     Amb Referral to Cardiac Rehabilitation   Complete by: As directed    Diagnosis: Coronary Stents   After initial evaluation and assessments completed: Virtual Based Care may be provided alone or in conjunction with Phase 2 Cardiac Rehab based on patient barriers.: Yes   Intensive Cardiac Rehabilitation (ICR) [facility] location only OR Traditional Cardiac Rehabilitation (TCR) *If criteria for ICR are not met will enroll in TCR ([facility] only): Yes   Call  MD for:  difficulty breathing, headache or visual disturbances   Complete by: As directed    Call MD for:  persistant dizziness or light-headedness   Complete by: As directed    Call MD for:  redness, tenderness, or signs of infection (pain, swelling, redness, odor or  green/yellow discharge around incision site)   Complete by: As directed    Diet - low sodium heart healthy   Complete by: As directed    Discharge instructions   Complete by: As directed    Radial Site Care Refer to this sheet in the next few weeks. These instructions provide you with information on caring for yourself after your procedure. Your caregiver may also give you more specific instructions. Your treatment has been planned according to current medical practices, but problems sometimes occur. Call your caregiver if you have any problems or questions after your procedure. HOME CARE INSTRUCTIONS You may shower the day after the procedure. Remove the bandage (dressing) and gently wash the site with plain soap and water . Gently pat the site dry.  Do not apply powder or lotion to the site.  Do not submerge the affected site in water  for 3 to 5 days.  Inspect the site at least twice daily.  Do not flex or bend the affected arm for 24 hours.  No lifting over 5 pounds (2.3 kg) for 5 days after your procedure.  Do not drive home if you are discharged the same day of the procedure. Have someone else drive you.  You may drive 24 hours after the procedure unless otherwise instructed by your caregiver.  What to expect: Any bruising will usually fade within 1 to 2 weeks.  Blood that collects in the tissue (hematoma) may be painful to the touch. It should usually decrease in size and tenderness within 1 to 2 weeks.  SEEK IMMEDIATE MEDICAL CARE IF: You have unusual pain at the radial site.  You have redness, warmth, swelling, or pain at the radial site.  You have drainage (other than a small amount of blood on the dressing).  You have chills.  You have a fever or persistent symptoms for more than 72 hours.  You have a fever and your symptoms suddenly get worse.  Your arm becomes pale, cool, tingly, or numb.  You have heavy bleeding from the site. Hold pressure on the site.   Groin Site  Care Refer to this sheet in the next few weeks. These instructions provide you with information on caring for yourself after your procedure. Your caregiver may also give you more specific instructions. Your treatment has been planned according to current medical practices, but problems sometimes occur. Call your caregiver if you have any problems or questions after your procedure. HOME CARE INSTRUCTIONS You may shower 24 hours after the procedure. Remove the bandage (dressing) and gently wash the site with plain soap and water . Gently pat the site dry.  Do not apply powder or lotion to the site.  Do not sit in a bathtub, swimming pool, or whirlpool for 5 to 7 days.  No bending, squatting, or lifting anything over 10 pounds (4.5 kg) as directed by your caregiver.  Inspect the site at least twice daily.  Do not drive home if you are discharged the same day of the procedure. Have someone else drive you.  You may drive 24 hours after the procedure unless otherwise instructed by your caregiver.  What to expect: Any bruising will usually fade within 1 to 2 weeks.  Blood that collects in the tissue (hematoma) may be painful to the touch. It should usually decrease in size and tenderness within 1 to 2 weeks.  SEEK IMMEDIATE MEDICAL CARE IF: You have unusual pain at the groin site or down the affected leg.  You have redness, warmth, swelling, or pain at the groin site.  You have drainage (other than a small amount of blood on the dressing).  You have chills.  You have a fever or persistent symptoms for more than 72 hours.  You have a fever and your symptoms suddenly get worse.  Your leg becomes pale, cool, tingly, or numb.  You have heavy bleeding from the site. Hold pressure on the site. Thomas Summers  PLEASE DO NOT MISS ANY DOSES OF YOUR PLAVIX !!!!! Also keep a log of you blood pressures and bring back to your follow up appt. Please call the office with any questions.   Patients taking blood thinners should  generally stay away from medicines like ibuprofen , Advil , Motrin , naproxen, and Aleve due to risk of stomach bleeding. You may take Tylenol  as directed or talk to your primary doctor about alternatives.   PLEASE ENSURE THAT YOU DO NOT RUN OUT OF YOUR PLAVIX . This medication is very important to remain on for at least 6 months. IF you have issues obtaining this medication due to cost please CALL the office 3-5 business days prior to running out in order to prevent missing doses of this medication.   Increase activity slowly   Complete by: As directed        Discharge Medications Allergies as of 10/12/2023   No Known Allergies      Medication List     PAUSE taking these medications    metFORMIN 500 MG tablet Wait to take this until: October 14, 2023 Evening Commonly known as: GLUCOPHAGE Take 500 mg by mouth daily.       STOP taking these medications    lisinopril  5 MG tablet Commonly known as: ZESTRIL    metoprolol  tartrate 25 MG tablet Commonly known as: LOPRESSOR        TAKE these medications    amiodarone  200 MG tablet Commonly known as: PACERONE  Take 1 tablet (200 mg total) by mouth 2 (two) times daily.   aspirin  EC 81 MG tablet Take 1 tablet (81 mg total) by mouth daily. Swallow whole.   atorvastatin  80 MG tablet Commonly known as: LIPITOR Take 1 tablet (80 mg total) by mouth at bedtime. What changed:  medication strength how much to take how to take this when to take this additional instructions   blood glucose meter kit and supplies Kit Dispense based on patient and insurance preference. ( One touch ) diabetes type 2. E11.9. Test blood sugar once a day   clopidogrel  75 MG tablet Commonly known as: PLAVIX  Take 1 tablet (75 mg total) by mouth daily with breakfast. Start taking on: October 13, 2023   fluticasone  50 MCG/ACT nasal spray Commonly known as: FLONASE  Place 1 spray into both nostrils daily as needed for allergies or rhinitis.   glucose blood  test strip Use 1x per day as directed.   losartan  25 MG tablet Commonly known as: Cozaar  Take 1 tablet (25 mg total) by mouth daily.   metoprolol  succinate 25 MG 24 hr tablet Commonly known as: Toprol  XL Take 1 tablet (25 mg total) by mouth daily. Take with or immediately following a meal.   nitroGLYCERIN  0.4 MG SL tablet Commonly known as: NITROSTAT  Place 1 tablet (0.4  mg total) under the tongue every 5 (five) minutes as needed for chest pain.   OneTouch Delica Lancets 33G Misc USE ONE  TO CHECK GLUCOSE ONCE DAILY   silodosin  8 MG Caps capsule Commonly known as: RAPAFLO  TAKE 1 CAPSULE BY MOUTH ONCE DAILY WITH BREAKFAST         Outstanding Labs/Studies  FLP/LFTs in 8 weeks  Duration of Discharge Encounter: APP Time: 25 minutes   Signed, Manuelita Rummer, NP 10/12/2023, 11:40 AM  Patient seen, examined. Available data reviewed. Agree with findings, assessment, and plan as outlined by Manuelita Rummer, NP.  Patient independently interviewed and examined.  HEENT normal, JVP normal, lungs clear bilaterally, heart regular rate rhythm no murmur gallop, abdomen soft nontender, extremities with no edema, right radial cath site is clear.  Post PCI instructions reviewed with the patient.  Medications reviewed as above.  Follow-up arranged.  Ozell Fell, M.D. 10/12/2023 12:18 PM

## 2023-10-12 NOTE — Progress Notes (Signed)
 Superficial bleeding from RFA access site (07/09) closed with Perclose. 2% lido/epi at site with no improvement in bleeding. Silk suture x2 with adequate hemostasis, new dressing applied. Will need to remove sutures before discharge.

## 2023-10-12 NOTE — Progress Notes (Signed)
 CARDIAC REHAB PHASE I    Pt sitting in chair, feeling well today. Pt ambulating independently in room. Tolerating  well with no CP, SOB or dizziness. Post stent education including site care, restrictions, risk factors, exercise guidelines, NTG use, antiplatelet therapy importance, heart healthy diabetic diet and CRP2 reviewed. All questions and concerns addressed. Will refer to [facility] for CRP2. Plan for home later today.    [id number] Vaughn Asberry Hacking, RN BSN 10/12/2023 10:06 AM

## 2023-10-12 NOTE — Telephone Encounter (Signed)
 Patient Product/process development scientist completed.    The patient is insured through [facility]. Patient has [employer], may use a copay card, and/or apply for patient assistance if available.    Ran test claim for prasugrel (Effient) 10 mg and the current 30 day co-pay is $5.92.  Ran test claim for ticagrelor (Brilinta) 90 mg and the current 30 day co-pay is $38.13.  Ran test claim for Jardiance 10 mg and the current 30 day co-pay is $183.11.  Ran test claim for Farxiga 10 mg and Product Not Covered  This test claim was processed through [employer]- copay amounts may vary at other pharmacies due to [employer], or as the patient moves through the different stages of their insurance plan.     Reyes Sharps, CPHT Pharmacy Technician III Certified Patient Advocate [facility] Pharmacy Patient Advocate Team Direct Number: [phone]  Fax: [phone]

## 2023-10-12 NOTE — Progress Notes (Signed)
 Removed stitches, hemostasis still good. Dermabond applied at incision and new dressing placed.

## 2023-10-12 NOTE — Progress Notes (Signed)
 AVS gone over with pt and his wife. All questions answered. Volunteer services called to take pt to [facility] pharmacy and then exit to D/C home with wife.

## 2023-10-12 NOTE — Progress Notes (Signed)
 Dr Almetta visited pt at 00:15.placed 2 stitches and applied gauze dressing.Stayed with pt for emotional support.The site was checked-soft,with no hematoma or pain noted.some bruising present.

## 2023-10-15 ENCOUNTER — Telehealth: Payer: Self-pay

## 2023-10-15 NOTE — Telephone Encounter (Addendum)
 Received page, Per wife Thomas Summers patient jerking in sleep and HR below 50.  Per chart review at last outpatient visit with Dr. Mallipeddi 10/04/23 patient noted to be bradycardic on heart monitor and patient was concerned about his HR in the low 50s. His BB was reduced to metoprolol  tartrate 12.5 mg bid via shared decision making. Noted to be getting OSA work-up. He was referred for [facility] which he received on 10/11/23 s/p DES to LAD. Patient had ongoing bleeding at RFA site that required 2 stitches.  Stitches were removed, and he was discharged on 10/12/23 . Medication changes at discharge: consolidated to metoprolol  succinate 25 mg and lisinopril  transitioned to losartan  with plans on starting entresto outpatient.  He has a follow-up with Dr. Mallipeddi on 8/12.  Patient shared last night he noticed he had an hour episode of HR lows 40 [seen on his home monitor] while he was asleep which is concerning to him. Per patient's wife he was jerking in his sleep which woke up her. Not a new problem. He is awaiting a sleep study.  I advised him that his low HR while sleeping was not concerning. I shared I cannot comment on the jerking, but I am reassured this is not a new issue and stopped when he woke up. We made the shared decision to return back to his metoprolol  tartrate 12.5 mg BID starting tomorrow morning. I advised him to remove the metoprolol  succinate from his reach to avoid confusion. He was in agreement to this plan. He is to make a PCP visit this week.   He also reported ongoing daytime fatigue, lightheadedness, and dizziness during the day. Denied shortness of breath and syncope. Rt radial and Rt groin sites per patient were stable without evidence of bleeding. I educated him that it is normal to have a certain level of fatigue after a procedure and to take it slow to return to baseline. He is awaiting a sleep study which could be playing into is daytime sleepiness as well. I told him if his  symptoms do not improve or worsen to go to the ER.   He also shared that he may have had an episode of chest discomfort Friday that he took a nitroglycerin  for. He was anxious during this time [per patient and wife]. He was also anxious that his blood pressure was up during this episode [ 150/90]. He denied any other chest pain episodes. I advised him if chest pain reoccurs to present to the ER. Most recent blood pressure was 130/70 this morning.   I do agree the patient should be seen sooner than 8/12 given he had a femoral access site, and that he is experiencing many symptoms, though not emergent. I will reach out to triage in efforts to move his appointment sooner.   Caller verbalized understanding and was grateful for the call back. I reiterated ED precautions.  Thomas LOISE Salen, PA-C 10/15/2023, 12:18 PM    ADDENDUM  Called patient back and confirmed he is compliant with his DAPT and amiodarone . Also confirmed that his lightheadedness, dizziness, and fatigue are NOT similar to his prior presentation that lead to his establishment with [facility] and found to be related to his arrhthymias. He uses his home HR monitor regularly and has noted average HR <100. Blood sugar has been stable with fasting sugar ~89. Again, advised patient if he has anymore chest pain episodes or worsening of his symptoms to present to the ED. I also reminded him  to follow up with his PCP this week if he can.

## 2023-10-16 ENCOUNTER — Other Ambulatory Visit: Payer: Self-pay

## 2023-10-16 ENCOUNTER — Emergency Department (HOSPITAL_COMMUNITY)

## 2023-10-16 ENCOUNTER — Encounter (HOSPITAL_COMMUNITY): Payer: Self-pay | Admitting: Emergency Medicine

## 2023-10-16 ENCOUNTER — Emergency Department (HOSPITAL_COMMUNITY)
Admission: EM | Admit: 2023-10-16 | Discharge: 2023-10-16 | Disposition: A | Discharge status: Home / Self Care | Attending: Emergency Medicine | Admitting: Emergency Medicine

## 2023-10-16 DIAGNOSIS — Z79899 Other long term (current) drug therapy: Secondary | ICD-10-CM | POA: Diagnosis not present

## 2023-10-16 DIAGNOSIS — R42 Dizziness and giddiness: Secondary | ICD-10-CM | POA: Insufficient documentation

## 2023-10-16 DIAGNOSIS — I2510 Atherosclerotic heart disease of native coronary artery without angina pectoris: Secondary | ICD-10-CM | POA: Diagnosis not present

## 2023-10-16 DIAGNOSIS — R0789 Other chest pain: Secondary | ICD-10-CM | POA: Insufficient documentation

## 2023-10-16 DIAGNOSIS — I110 Hypertensive heart disease with heart failure: Secondary | ICD-10-CM | POA: Insufficient documentation

## 2023-10-16 DIAGNOSIS — Z7982 Long term (current) use of aspirin: Secondary | ICD-10-CM | POA: Insufficient documentation

## 2023-10-16 DIAGNOSIS — E1140 Type 2 diabetes mellitus with diabetic neuropathy, unspecified: Secondary | ICD-10-CM | POA: Insufficient documentation

## 2023-10-16 DIAGNOSIS — I493 Ventricular premature depolarization: Secondary | ICD-10-CM | POA: Diagnosis not present

## 2023-10-16 DIAGNOSIS — I509 Heart failure, unspecified: Secondary | ICD-10-CM | POA: Diagnosis not present

## 2023-10-16 DIAGNOSIS — I5020 Unspecified systolic (congestive) heart failure: Secondary | ICD-10-CM | POA: Diagnosis not present

## 2023-10-16 DIAGNOSIS — Z7984 Long term (current) use of oral hypoglycemic drugs: Secondary | ICD-10-CM | POA: Diagnosis not present

## 2023-10-16 LAB — CBC WITH DIFFERENTIAL/PLATELET
Abs Immature Granulocytes: 0.1 K/uL — ABNORMAL HIGH (ref 0.00–0.07)
Basophils Absolute: 0 K/uL (ref 0.0–0.1)
Basophils Relative: 1 %
Eosinophils Absolute: 0.2 K/uL (ref 0.0–0.5)
Eosinophils Relative: 2 %
HCT: 44.1 % (ref 39.0–52.0)
Hemoglobin: 14.7 g/dL (ref 13.0–17.0)
Immature Granulocytes: 1 %
Lymphocytes Relative: 17 %
Lymphs Abs: 1.4 K/uL (ref 0.7–4.0)
MCH: 31.4 pg (ref 26.0–34.0)
MCHC: 33.3 g/dL (ref 30.0–36.0)
MCV: 94.2 fL (ref 80.0–100.0)
Monocytes Absolute: 0.9 K/uL (ref 0.1–1.0)
Monocytes Relative: 10 %
Neutro Abs: 6 K/uL (ref 1.7–7.7)
Neutrophils Relative %: 69 %
Platelets: 179 K/uL (ref 150–400)
RBC: 4.68 MIL/uL (ref 4.22–5.81)
RDW: 15.1 % (ref 11.5–15.5)
WBC: 8.6 K/uL (ref 4.0–10.5)
nRBC: 0 % (ref 0.0–0.2)

## 2023-10-16 LAB — COMPREHENSIVE METABOLIC PANEL WITH GFR
ALT: 25 U/L (ref 0–44)
AST: 19 U/L (ref 15–41)
Albumin: 3.8 g/dL (ref 3.5–5.0)
Alkaline Phosphatase: 71 U/L (ref 38–126)
Anion gap: 10 (ref 5–15)
BUN: 18 mg/dL (ref 8–23)
CO2: 24 mmol/L (ref 22–32)
Calcium: 9.1 mg/dL (ref 8.9–10.3)
Chloride: 101 mmol/L (ref 98–111)
Creatinine, Ser: 0.79 mg/dL (ref 0.61–1.24)
GFR, Estimated: >60 mL/min (ref >60)
Glucose, Bld: 99 mg/dL (ref 70–99)
Potassium: 4 mmol/L (ref 3.5–5.1)
Sodium: 135 mmol/L (ref 135–145)
Total Bilirubin: 1.3 mg/dL — ABNORMAL HIGH (ref 0.0–1.2)
Total Protein: 6.9 g/dL (ref 6.5–8.1)

## 2023-10-16 LAB — TSH: TSH: 1.212 u[IU]/mL (ref 0.350–4.500)

## 2023-10-16 LAB — TROPONIN I (HIGH SENSITIVITY)
Troponin I (High Sensitivity): 218 ng/L (ref <18)
Troponin I (High Sensitivity): 205 ng/L (ref <18)

## 2023-10-16 LAB — CBG MONITORING, ED: Glucose-Capillary: 95 mg/dL (ref 70–99)

## 2023-10-16 MED ORDER — ASPIRIN 81 MG PO CHEW
324.0000 mg | CHEWABLE_TABLET | Freq: Once | ORAL | Status: AC
  Administered 2023-10-16: 324 mg via ORAL
  Filled 2023-10-16: qty 4

## 2023-10-16 MED ORDER — METOPROLOL TARTRATE 25 MG PO TABS: 12.5000 mg | ORAL_TABLET | Freq: Two times a day (BID) | ORAL | 2 refills | Status: DC

## 2023-10-16 MED ORDER — AMIODARONE HCL 200 MG PO TABS: 200.0000 mg | ORAL_TABLET | Freq: Two times a day (BID) | ORAL | 5 refills | Status: DC

## 2023-10-16 NOTE — ED Triage Notes (Addendum)
 Pt BIB EMS from home with c/o dizziness that started last night. Had a stent placed last week at [facility]. Denies cp or shob, no orthostatic changes with ems.   Pt took one SL nitroglycerin  for his BP.

## 2023-10-16 NOTE — Discharge Instructions (Signed)
 Cardiology recommends the following changes to your daily medications: -Take 400 mg of amiodarone  twice daily for the next 2 weeks.  After that, go back to the 200 mg twice a day dosing. -Take 12.5 mg of metoprolol  tartrate twice daily.  Follow-up in cardiology office.  Return to the emergency department for any new or worsening symptoms of concern.

## 2023-10-16 NOTE — ED Provider Notes (Signed)
 [place] EMERGENCY DEPARTMENT AT [facility] Provider Note   CSN: [id number] Arrival date & time: 10/16/23  9381     Patient presents with: Dizziness   Thomas Summers is a 68 y.o. male.    Dizziness Associated symptoms: chest pain   Patient presents for dizziness.  Medical history includes HTN, DM, HLD, CAD, BPH, neuropathy, CHF.  He was recently admitted 5 days ago for unstable angina.  He underwent heart cath that day which showed multivessel disease.  He had placement of DES.  He underwent several medication changes.  Current medications include amiodarone , ASA, Plavix , losartan , metoprolol .  Since his heart cath, he has had ongoing dizziness and fatigue.  He describes his dizziness as nonpositional.  His Fitbit has alerted him that he has had bradycardia in the range of 40s while sleeping.  Symptoms seemed to worsen last night.  For this reason, he resents the ED today.  While in the ED, he had onset of upper chest discomfort.     Prior to Admission medications   Medication Sig Start Date End Date Taking? Authorizing Provider  metoprolol  tartrate (LOPRESSOR ) 25 MG tablet Take 0.5 tablets (12.5 mg total) by mouth 2 (two) times daily. 10/16/23  Yes Melvenia Motto, MD  amiodarone  (PACERONE ) 200 MG tablet Take 1 tablet (200 mg total) by mouth 2 (two) times daily. For the next 2 weeks, take 400 mg twice daily.  Go back to 200 mg twice daily after that. 10/16/23   Melvenia Motto, MD  aspirin  EC 81 MG tablet Take 1 tablet (81 mg total) by mouth daily. Swallow whole. 10/04/23   Mallipeddi, Vishnu P, MD  atorvastatin  (LIPITOR) 80 MG tablet Take 1 tablet (80 mg total) by mouth at bedtime. 10/12/23   Henry Manuelita NOVAK, NP  blood glucose meter kit and supplies KIT Dispense based on patient and insurance preference. ( One touch ) diabetes type 2. E11.9. Test blood sugar once a day 04/08/16   [name], MD  clopidogrel  (PLAVIX ) 75 MG tablet Take 1 tablet (75 mg total) by mouth daily  with breakfast. 10/13/23   Henry Manuelita NOVAK, NP  fluticasone  (FLONASE ) 50 MCG/ACT nasal spray Place 1 spray into both nostrils daily as needed for allergies or rhinitis. 02/18/23   [provider]  glucose blood test strip Use 1x per day as directed. 11/27/20   Waddell Catholic M, DO  losartan  (COZAAR ) 25 MG tablet Take 1 tablet (25 mg total) by mouth daily. 10/12/23 10/11/24  Henry Manuelita NOVAK, NP  metFORMIN (GLUCOPHAGE) 500 MG tablet Take 500 mg by mouth daily. 05/05/22   [provider]  nitroGLYCERIN  (NITROSTAT ) 0.4 MG SL tablet Place 1 tablet (0.4 mg total) under the tongue every 5 (five) minutes as needed for chest pain. 10/12/23 10/11/24  Henry Manuelita NOVAK, NP  OneTouch Delica Lancets 33G MISC USE ONE  TO CHECK GLUCOSE ONCE DAILY 11/27/20   Waddell Catholic M, DO  silodosin  (RAPAFLO ) 8 MG CAPS capsule TAKE 1 CAPSULE BY MOUTH ONCE DAILY WITH BREAKFAST 09/08/21   McKenzie, Belvie CROME, MD    Allergies: Patient has no known allergies.    Review of Systems  Constitutional:  Positive for fatigue.  Cardiovascular:  Positive for chest pain.  Neurological:  Positive for dizziness.  All other systems reviewed and are negative.   Updated Vital Signs BP (!) 158/91   Pulse (!) 53   Temp 97.8 F (36.6 C)   Resp 16   Ht 6' 1 (1.854  m)   Wt 90.7 kg   SpO2 99%   BMI 26.38 kg/m   Physical Exam Vitals and nursing note reviewed.  Constitutional:      General: He is not in acute distress.    Appearance: Normal appearance. He is well-developed. He is not ill-appearing, toxic-appearing or diaphoretic.  HENT:     Head: Normocephalic and atraumatic.     Right Ear: External ear normal.     Left Ear: External ear normal.     Nose: Nose normal.     Mouth/Throat:     Mouth: Mucous membranes are moist.  Eyes:     Extraocular Movements: Extraocular movements intact.     Conjunctiva/sclera: Conjunctivae normal.  Cardiovascular:     Rate and Rhythm: Normal rate and regular rhythm.      Heart sounds: No murmur heard. Pulmonary:     Effort: Pulmonary effort is normal. No respiratory distress.     Breath sounds: Normal breath sounds. No wheezing or rales.  Chest:     Chest wall: No tenderness.  Abdominal:     General: There is no distension.     Palpations: Abdomen is soft.     Tenderness: There is no abdominal tenderness.  Musculoskeletal:        General: No swelling. Normal range of motion.     Cervical back: Normal range of motion and neck supple.  Skin:    General: Skin is warm and dry.     Capillary Refill: Capillary refill takes less than 2 seconds.     Coloration: Skin is not jaundiced or pale.  Neurological:     General: No focal deficit present.     Mental Status: He is alert and oriented to person, place, and time.     Cranial Nerves: No cranial nerve deficit.     Sensory: No sensory deficit.     Motor: No weakness.     Coordination: Coordination normal.  Psychiatric:        Mood and Affect: Mood normal.        Behavior: Behavior normal.     (all labs ordered are listed, but only abnormal results are displayed) Labs Reviewed  COMPREHENSIVE METABOLIC PANEL WITH GFR - Abnormal; Notable for the following components:      Result Value   Total Bilirubin 1.3 (*)    All other components within normal limits  CBC WITH DIFFERENTIAL/PLATELET - Abnormal; Notable for the following components:   Abs Immature Granulocytes 0.10 (*)    All other components within normal limits  TROPONIN I (HIGH SENSITIVITY) - Abnormal; Notable for the following components:   Troponin I (High Sensitivity) 218 (*)    All other components within normal limits  TROPONIN I (HIGH SENSITIVITY) - Abnormal; Notable for the following components:   Troponin I (High Sensitivity) 205 (*)    All other components within normal limits  TSH  CBG MONITORING, ED    EKG: EKG Interpretation Date/Time:  Monday October 16 2023 07:56:45 EDT Ventricular Rate:  73 PR Interval:  155 QRS  Duration:  139 QT Interval:  448 QTC Calculation: 494 R Axis:   21  Text Interpretation: Sinus rhythm Ventricular bigeminy Right bundle branch block Confirmed by Melvenia Motto ([id number]) on 10/16/2023 10:36:21 AM  Radiology: [facility] Chest Portable 1 View Result Date: 10/16/2023 CLINICAL DATA:  Stent inserted last Wednesday, not feeling any better. Dizziness and chest discomfort. EXAM: PORTABLE CHEST 1 VIEW COMPARISON:  Portable chest 03/16/2023 FINDINGS: There are mild chronic changes  of the lungs. No focal pneumonia is seen. The sulci are sharp. Heart size and vascular pattern are normal. The mediastinum is stable with mild aortic atherosclerosis. No vascular congestion is seen. No new osseous findings. Thoracic spondylosis. IMPRESSION: No evidence of acute chest disease. Stable chest with chronic changes. Aortic atherosclerosis. Electronically Signed   By: Francis Quam M.D.   On: 10/16/2023 07:57     Procedures   Medications Ordered in the ED  aspirin  chewable tablet 324 mg (324 mg Oral Given 10/16/23 0810)                                    Medical Decision Making Amount and/or Complexity of Data Reviewed Labs: ordered. Radiology: ordered.  Risk OTC drugs.   This patient presents to the ED for concern of fatigue and lightheadedness, this involves an extensive number of treatment options, and is a complaint that carries with it a high risk of complications and morbidity.  The differential diagnosis includes polypharmacy, arrhythmia, dehydration   Co morbidities / Chronic conditions that complicate the patient evaluation  HTN, DM, HLD, CAD, BPH, neuropathy, CHF   Additional history obtained:  Additional history obtained from EMR External records from outside source obtained and reviewed including N/A   Lab Tests:  I Ordered, and personally interpreted labs.  The pertinent results include: Normal hemoglobin, no leukocytosis, normal kidney function, normal electrolytes.  Troponin is  elevated in the range of 200 but stable on repeat.   Imaging Studies ordered:  I ordered imaging studies including chest x-ray I independently visualized and interpreted imaging which showed no acute findings I agree with the radiologist interpretation   Cardiac Monitoring: / EKG:  The patient was maintained on a cardiac monitor.  I personally viewed and interpreted the cardiac monitored which showed an underlying rhythm of: Sinus rhythm   Problem List / ED Course / Critical interventions / Medication management  Patient presenting for worsening dizziness and fatigue since heart cath 5 days ago.  Additionally, he states that he had onset of upper chest discomfort while in the ED.  On exam, he is overall well-appearing.  Blood pressure is in the range 140s SBP.  He states that this has increased since his recent medication changes since last week.  Per chart review, he was started on metoprolol  earlier this year due to large burden of PVCs and NSVT identified on Zio patch.  He was eventually started on amiodarone .  Recent echocardiogram showed LVEF of 40 to 45% without valvular disease.  Cardiac CT showed elevated calcium  score.  He underwent heart cath 5 days ago which showed multivessel disease.  He underwent placement of DES to LAD.  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  He has been started on aspirin  and Plavix .  Last dose of aspirin  was last night.  He has not taken his Plavix  this morning.  Initial lab work notable for troponin of 218.  324 ASA was ordered.  Cardiology was consulted.  Spoke with cardiologist, Dr. Alvan, who will see in consult.  On monitor, patient remained in sinus rhythm with frequent PVCs.  Second troponin did not show any further elevation.  Dr. Alvan evaluated patient at bedside.  Recommendations are as follows: Increase amiodarone  to 400 mg twice daily for 2 weeks and then decrease to 200 mg twice daily.  Change Lopressor  dosing of  12.5 mg twice daily.  Patient to follow-up outpatient.  Patient is comfortable with this plan.  He was discharged in stable condition. I ordered medication including ASA for chest pain Reevaluation of the patient after these medicines showed that the patient improved I have reviewed the patients home medicines and have made adjustments as needed   Consultations Obtained:  I requested consultation with the cardiologist, Dr. Alvan,  and discussed lab and imaging findings as well as pertinent plan - they recommend: Changes to current amiodarone  and Lopressor  dosing.  Outpatient follow-up.   Social Determinants of Health:  Lives independently     Final diagnoses:  Dizziness    ED Discharge Orders          Ordered    amiodarone  (PACERONE ) 200 MG tablet  2 times daily       Note to Pharmacy: 09/18/2023-New   10/16/23 1225    metoprolol  tartrate (LOPRESSOR ) 25 MG tablet  2 times daily        10/16/23 1225               Melvenia Motto, MD 10/16/23 1227

## 2023-10-16 NOTE — ED Notes (Signed)
 Provider at bedside

## 2023-10-16 NOTE — Consult Note (Signed)
 Cardiology Consultation   Patient ID: Thomas Summers MRN: [id number]; DOB: [birth year]  Admit date: 10/16/2023 Date of Consult: 10/16/2023  PCP:  Alston Silvio BROCKS, FNP   [facility] Providers Cardiologist:  Diannah SHAUNNA Maywood, MD        Patient Profile: Thomas Summers is a 68 y.o. 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  History of Present Illness: Mr. 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 Amio was started as outpatient with initial improvement in symptoms. Significant recurrence of symptoms however last night while laying in bed. Episode lasted about 5-6 hours, similar to his prior symptoms but longer in duration. Due to longevity of symptoms presented to ER  While in ER isolated episode of 2/10 left upper chest pain, pulling like feeling that was transient. He states he thinks may have been related to his tele leads pulling when he was in a certain position, denies any high level of discomfort or anginal symptoms.    K 4 Cr 0.79 BUN 18 TSH 1.2 WBC 8.6 Hgb 14.7 Plt 179  Trop 218-->205 EKG: SR, bigeminy.  CXR: no acute process  09/2023 echo: LVEF 40-45%, no WMAs, grad I dd Past Medical History:  Diagnosis Date   Allergy    Depression    Diabetes mellitus without complication (HCC)    Heart murmur    Hyperlipidemia    Hypertension     Past Surgical History:  Procedure Laterality Date   COLONOSCOPY N/A 08/13/2015   Procedure: COLONOSCOPY;  Surgeon: Claudis RAYMOND Rivet, MD;  [facility];  Service: Endoscopy;  Laterality: N/A;  1200   COLONOSCOPY  08/13/2015   [facility] - Dr.Rehman   COLONOSCOPY N/A  01/14/2021   Procedure: COLONOSCOPY;  Surgeon: Rivet Claudis RAYMOND, MD;  Location: [facility];  Service: Endoscopy;  Laterality: N/A;  7:30   CORONARY STENT INTERVENTION N/A 10/11/2023   Procedure: CORONARY STENT INTERVENTION;  Surgeon: Verlin Lonni BIRCH, MD;  Location: [facility];  Service: Cardiovascular;  Laterality: N/A;   LEFT HEART CATH AND CORONARY ANGIOGRAPHY N/A 10/11/2023   Procedure: LEFT HEART CATH AND CORONARY ANGIOGRAPHY;  Surgeon: Verlin Lonni BIRCH, MD;  Location: [facility];  Service: Cardiovascular;  Laterality: N/A;   MENISCUS REPAIR Left 1997   OTHER SURGICAL HISTORY     left ear surgery   OTHER SURGICAL HISTORY     fracture arm   POLYPECTOMY  01/14/2021   Procedure: POLYPECTOMY;  Surgeon: Rivet Claudis RAYMOND, MD;  Location: [facility];  Service: Endoscopy;;   TONSILLECTOMY       Scheduled Meds:  Continuous Infusions:  PRN Meds:   Allergies:   No Known Allergies  Social History:   Social History   Socioeconomic History   Marital status: Married    Spouse name: Not on file   Number of children: Not on file   Years of education: Not on file   Highest education level: Not on file  Occupational History   Not on file  Tobacco Use   Smoking status: Never   Smokeless tobacco: Never  Substance and Sexual Activity   Alcohol use:  No   Drug use: No   Sexual activity: Not on file  Other Topics Concern   Not on file  Social History Narrative   Not on file   Social Drivers of Health   Financial Resource Strain: Not on file  Food Insecurity: No Food Insecurity (10/11/2023)   Hunger Vital Sign    Worried About Running Out of Food in the Last Year: Never true    Ran Out of Food in the Last Year: Never true  Transportation Needs: No Transportation Needs (10/11/2023)   PRAPARE - Administrator, Civil Service (Medical): No    Lack of Transportation (Non-Medical): No  Physical Activity: Not on file  Stress: Not on file  Social  Connections: Socially Integrated (10/11/2023)   Social Connection and Isolation Panel    Frequency of Communication with Friends and Family: Three times a week    Frequency of Social Gatherings with Friends and Family: Twice a week    Attends Religious Services: 1 to 4 times per year    Active Member of [employer] or Organizations: No    Attends Engineer, structural: 1 to 4 times per year    Marital Status: Married  Catering manager Violence: Not At Risk (10/11/2023)   Humiliation, Afraid, Rape, and Kick questionnaire    Fear of Current or Ex-Partner: No    Emotionally Abused: No    Physically Abused: No    Sexually Abused: No    Family History:   Family History  Problem Relation Age of Onset   Hypertension Father    Heart disease Father    Healthy Mother    Healthy Sister    Healthy Brother    Healthy Sister    Healthy Brother    Crohn's disease Daughter    Migraines Daughter    Healthy Daughter      ROS:  Please see the history of present illness.   All other ROS reviewed and negative.     Physical Exam/Data: Vitals:   10/16/23 0830 10/16/23 0845 10/16/23 0900 10/16/23 0915  BP: 128/80 123/78 132/79 138/84  Pulse: (!) 50 (!) 58 70 69  Resp: 17 11 17 18   Temp:      SpO2: 96% 95% 96% 95%  Weight:      Height:       No intake or output data in the 24 hours ending 10/16/23 1049    10/16/2023    6:25 AM 10/11/2023    1:00 PM 10/04/2023   10:26 AM  Last 3 Weights  Weight (lbs) 199 lb 15.3 oz 200 lb 196 lb 12.8 oz  Weight (kg) 90.7 kg 90.719 kg 89.268 kg     Body mass index is 26.38 kg/m.  General:  Well nourished, well developed, in no acute distress HEENT: normal Neck: no JVD Vascular: No carotid bruits; Distal pulses 2+ bilaterally Cardiac:  normal S1, S2; RRR; no murmur  Lungs:  clear to auscultation bilaterally, no wheezing, rhonchi or rales  Abd: soft, nontender, no hepatomegaly  Ext: no edema Musculoskeletal:  No deformities, BUE and BLE strength normal  and equal Skin: warm and dry  Neuro:  CNs 2-12 intact, no focal abnormalities noted Psych:  Normal affect     Laboratory Data: High Sensitivity Troponin:   Recent Labs  Lab 10/16/23 0701 10/16/23 0918  TROPONINIHS 218* 205*     Chemistry Recent Labs  Lab 10/12/23 0419 10/16/23 0701  NA 136 135  K 4.2 4.0  CL  106 101  CO2 23 24  GLUCOSE 95 99  BUN 18 18  CREATININE 0.97 0.79  CALCIUM  8.5* 9.1  GFRNONAA >60 >60  ANIONGAP 7 10    Recent Labs  Lab 10/16/23 0701  PROT 6.9  ALBUMIN 3.8  AST 19  ALT 25  ALKPHOS 71  BILITOT 1.3*   Lipids  Recent Labs  Lab 10/12/23 0800  CHOL 109  TRIG 66  HDL 49  LDLCALC 47  CHOLHDL 2.2    Hematology Recent Labs  Lab 10/12/23 0419 10/16/23 0701  WBC 10.0 8.6  RBC 4.32 4.68  HGB 13.7 14.7  HCT 41.0 44.1  MCV 94.9 94.2  MCH 31.7 31.4  MCHC 33.4 33.3  RDW 15.5 15.1  PLT 171 179   Thyroid   Recent Labs  Lab 10/16/23 0918  TSH 1.212    BNPNo results for input(s): BNP, PROBNP in the last 168 hours.  DDimer No results for input(s): DDIMER in the last 168 hours.  Radiology/Studies:  DG Chest Portable 1 View Result Date: 10/16/2023 CLINICAL DATA:  Stent inserted last Wednesday, not feeling any better. Dizziness and chest discomfort. EXAM: PORTABLE CHEST 1 VIEW COMPARISON:  Portable chest 03/16/2023 FINDINGS: There are mild chronic changes of the lungs. No focal pneumonia is seen. The sulci are sharp. Heart size and vascular pattern are normal. The mediastinum is stable with mild aortic atherosclerosis. No vascular congestion is seen. No new osseous findings. Thoracic spondylosis. IMPRESSION: No evidence of acute chest disease. Stable chest with chronic changes. Aortic atherosclerosis. Electronically Signed   By: Francis Quam M.D.   On: 10/16/2023 07:57     Assessment and Plan: CAD - 10/03/23: CT cardiac showed coronary artery calcium  score of 3167 (97th percentile for age and sex matched control), extensive total  plaque volume and severe flow-limiting stenosis in the proximal to mid LAD and OM1  - 10/11/23 cath: ostial LAD 40%, mid LAD 80%, ostial LCX 30%, OM 2 99% to small for PCI, mid RCA 40%. LVEDP 10. Received DES to LAD  - presents with chest pain. Isolated atypical episode, he thinks he may have actually been his telemetry leads pulling with a position he was in.  - trop 218-->205, suspect resolving from intervention 7/10 vs small vessel disease too small to stent. EKG without acute ischemic changes - medical therapy with ASA 81, atorva 80, plavix  75, losartan  25 - continue current medical therapy, if recurrent symptoms more suggestive of angina conider adjustement of antianginals as outpatient.    2.Dizziness/PVCs - evaluated as outpatient - event monitor was performed that showed 22.9% PVC burden, 69 runs of NSVT (fastest lasting 10 beats and the longest lasting 18 beats). Symptomatic with NSR, PAC, PVC, ventricular bigeminy and ventricular trigeminy. He also had 4.9% PAC burden and 49 runs of nonsustained SVT.  - from noted dizziness had improved after starting amiodarone  and was thought to be PVC related - recurrent dizziness similar to prior episodes. EKG shows bigeminy, tele SR with PVCs. No sustained arrhythmias - will reload amio 400mg  bid x 7 days then back to 200mg  bid he was on, look to get earlier appt with Dr Mallipeddi. Back on lopressor  12.5mg  bid    3.HFmrEF - 09/2023 echo: LVEF 40-45%, no WMAs, grad I dd -defer medical therapy to his primary cardiologist   [facility] for discharge from ER, we will arrange outpatient f/u. Would plan for amio 400mg  bid x 2 weeks then 200mg  bid. Lopressor  12.5mg  bid  For questions or updates, please  contact [facility] Please consult [email or web address] for contact info under    Signed, Alvan Carrier, MD  10/16/2023 10:49 AM

## 2023-10-18 NOTE — Telephone Encounter (Signed)
 Spoke with pt and he will hold Lopressor  for now and see H. Meng as scheduled.

## 2023-10-19 ENCOUNTER — Telehealth: Payer: Self-pay | Admitting: Internal Medicine

## 2023-10-19 NOTE — Telephone Encounter (Signed)
 Pt c/o medication issue:  1. Name of Medication: Metoprolol   2. How are you currently taking this medication (dosage and times per day)?   3. Are you having a reaction (difficulty breathing--STAT)?   4. What is your medication issue? Patient said Dr Mallipeddi stopped his Metoprolol  and he does not feel right, he says he just really feels bad, and dizzy also

## 2023-10-19 NOTE — Telephone Encounter (Signed)
 HR today in 70-80's with rest and when walking HR 90's.  Denies palpitations, chest pain, or sob. Reports dizziness continues and is unchanged from previous complaint. Reports blood pressures are averaging around 120/75. Reports staying well hydrated. Reports he was told to stop lopressor  12.5 mg BID yesterday and he was starting to feel better.  Reports he doesn't feel comfortable stopping lopressor .  Advised that message would be sent to provider. Advised to keep scheduled visit next week on 10/26/2023 with Janene and if he develops worsening symptoms, to go to the ED for an evaluation. Verbalized understanding of plan.

## 2023-10-21 ENCOUNTER — Encounter (HOSPITAL_COMMUNITY): Payer: Self-pay | Admitting: *Deleted

## 2023-10-21 ENCOUNTER — Emergency Department (HOSPITAL_COMMUNITY)
Admission: EM | Admit: 2023-10-21 | Discharge: 2023-10-21 | Disposition: A | Discharge status: Home / Self Care | Attending: Emergency Medicine | Admitting: Emergency Medicine

## 2023-10-21 ENCOUNTER — Other Ambulatory Visit: Payer: Self-pay

## 2023-10-21 DIAGNOSIS — Z7984 Long term (current) use of oral hypoglycemic drugs: Secondary | ICD-10-CM | POA: Diagnosis not present

## 2023-10-21 DIAGNOSIS — Z7982 Long term (current) use of aspirin: Secondary | ICD-10-CM | POA: Insufficient documentation

## 2023-10-21 DIAGNOSIS — I2510 Atherosclerotic heart disease of native coronary artery without angina pectoris: Secondary | ICD-10-CM | POA: Diagnosis not present

## 2023-10-21 DIAGNOSIS — F419 Anxiety disorder, unspecified: Secondary | ICD-10-CM | POA: Diagnosis not present

## 2023-10-21 DIAGNOSIS — Z955 Presence of coronary angioplasty implant and graft: Secondary | ICD-10-CM | POA: Insufficient documentation

## 2023-10-21 DIAGNOSIS — I10 Essential (primary) hypertension: Secondary | ICD-10-CM | POA: Diagnosis present

## 2023-10-21 DIAGNOSIS — Z7902 Long term (current) use of antithrombotics/antiplatelets: Secondary | ICD-10-CM | POA: Insufficient documentation

## 2023-10-21 DIAGNOSIS — E119 Type 2 diabetes mellitus without complications: Secondary | ICD-10-CM | POA: Insufficient documentation

## 2023-10-21 DIAGNOSIS — Z79899 Other long term (current) drug therapy: Secondary | ICD-10-CM | POA: Insufficient documentation

## 2023-10-21 LAB — CBC
HCT: 44.1 % (ref 39.0–52.0)
Hemoglobin: 14.8 g/dL (ref 13.0–17.0)
MCH: 31.7 pg (ref 26.0–34.0)
MCHC: 33.6 g/dL (ref 30.0–36.0)
MCV: 94.4 fL (ref 80.0–100.0)
Platelets: 172 K/uL (ref 150–400)
RBC: 4.67 MIL/uL (ref 4.22–5.81)
RDW: 14.8 % (ref 11.5–15.5)
WBC: 8.4 K/uL (ref 4.0–10.5)
nRBC: 0 % (ref 0.0–0.2)

## 2023-10-21 LAB — URINALYSIS, ROUTINE W REFLEX MICROSCOPIC
Bilirubin Urine: NEGATIVE
Glucose, UA: NEGATIVE mg/dL
Hgb urine dipstick: NEGATIVE
Ketones, ur: NEGATIVE mg/dL
Leukocytes,Ua: NEGATIVE
Nitrite: NEGATIVE
Protein, ur: NEGATIVE mg/dL
Specific Gravity, Urine: 1.003 — ABNORMAL LOW (ref 1.005–1.030)
pH: 7 (ref 5.0–8.0)

## 2023-10-21 LAB — COMPREHENSIVE METABOLIC PANEL WITH GFR
ALT: 30 U/L (ref 0–44)
AST: 22 U/L (ref 15–41)
Albumin: 4 g/dL (ref 3.5–5.0)
Alkaline Phosphatase: 74 U/L (ref 38–126)
Anion gap: 13 (ref 5–15)
BUN: 17 mg/dL (ref 8–23)
CO2: 20 mmol/L — ABNORMAL LOW (ref 22–32)
Calcium: 8.9 mg/dL (ref 8.9–10.3)
Chloride: 105 mmol/L (ref 98–111)
Creatinine, Ser: 0.74 mg/dL (ref 0.61–1.24)
GFR, Estimated: >60 mL/min (ref >60)
Glucose, Bld: 98 mg/dL (ref 70–99)
Potassium: 3.7 mmol/L (ref 3.5–5.1)
Sodium: 138 mmol/L (ref 135–145)
Total Bilirubin: 1.4 mg/dL — ABNORMAL HIGH (ref 0.0–1.2)
Total Protein: 6.8 g/dL (ref 6.5–8.1)

## 2023-10-21 LAB — TROPONIN I (HIGH SENSITIVITY): Troponin I (High Sensitivity): 15 ng/L (ref <18)

## 2023-10-21 LAB — CBG MONITORING, ED: Glucose-Capillary: 92 mg/dL (ref 70–99)

## 2023-10-21 MED ORDER — HYDROXYZINE HCL 25 MG PO TABS: ORAL_TABLET | ORAL | 1 refills | Status: AC

## 2023-10-21 NOTE — Discharge Instructions (Signed)
 Take 1 metoprolol  in the morning.  Just check your blood pressure once a day.  Keep your appointment with your cardiologist

## 2023-10-21 NOTE — ED Notes (Signed)
 Orthostatic VS: Laying: BP 160/75 MAP 99 HR 66 O2 97   Sitting: BP 147/73 MAP 94 HR 79 O2 97    Standing: BP 148/79 MAP 98 HR 85 O2 98

## 2023-10-21 NOTE — ED Provider Notes (Signed)
 [place] EMERGENCY DEPARTMENT AT [facility] Provider Note   CSN: [id number] Arrival date & time: 10/21/23  1311     Patient presents with: Near Syncope   Thomas Summers is a 68 y.o. male.  {Add pertinent medical, surgical, social history, OB history to YEP:67052} Patient has a history of coronary artery disease and recently had a stent placed.  Patient states his blood pressure has been elevated since he stopped his metoprolol    Near Syncope       Prior to Admission medications   Medication Sig Start Date End Date Taking? Authorizing Provider  hydrOXYzine  (ATARAX ) 25 MG tablet Take one every 12 hours for anxiety or  sleep 10/21/23  Yes Maxwell Lemen, MD  amiodarone  (PACERONE ) 200 MG tablet Take 1 tablet (200 mg total) by mouth 2 (two) times daily. For the next 2 weeks, take 400 mg twice daily.  Go back to 200 mg twice daily after that. 10/16/23   Melvenia Motto, MD  aspirin  EC 81 MG tablet Take 1 tablet (81 mg total) by mouth daily. Swallow whole. 10/04/23   Mallipeddi, Vishnu P, MD  atorvastatin  (LIPITOR) 80 MG tablet Take 1 tablet (80 mg total) by mouth at bedtime. 10/12/23   Henry Manuelita NOVAK, NP  blood glucose meter kit and supplies KIT Dispense based on patient and insurance preference. ( One touch ) diabetes type 2. E11.9. Test blood sugar once a day 04/08/16   [name], MD  clopidogrel  (PLAVIX ) 75 MG tablet Take 1 tablet (75 mg total) by mouth daily with breakfast. 10/13/23   Henry Manuelita NOVAK, NP  fluticasone  (FLONASE ) 50 MCG/ACT nasal spray Place 1 spray into both nostrils daily as needed for allergies or rhinitis. 02/18/23   [provider]  glucose blood test strip Use 1x per day as directed. 11/27/20   Waddell Catholic M, DO  losartan  (COZAAR ) 25 MG tablet Take 1 tablet (25 mg total) by mouth daily. 10/12/23 10/11/24  Henry Manuelita NOVAK, NP  metFORMIN (GLUCOPHAGE) 500 MG tablet Take 500 mg by mouth daily. 05/05/22   [provider]   metoprolol  tartrate (LOPRESSOR ) 25 MG tablet Take 0.5 tablets (12.5 mg total) by mouth 2 (two) times daily. 10/16/23   Melvenia Motto, MD  nitroGLYCERIN  (NITROSTAT ) 0.4 MG SL tablet Place 1 tablet (0.4 mg total) under the tongue every 5 (five) minutes as needed for chest pain. 10/12/23 10/11/24  Henry Manuelita NOVAK, NP  OneTouch Delica Lancets 33G MISC USE ONE  TO CHECK GLUCOSE ONCE DAILY 11/27/20   Waddell Catholic M, DO  silodosin  (RAPAFLO ) 8 MG CAPS capsule TAKE 1 CAPSULE BY MOUTH ONCE DAILY WITH BREAKFAST 09/08/21   McKenzie, Belvie CROME, MD    Allergies: Patient has no known allergies.    Review of Systems  Cardiovascular:  Positive for near-syncope.    Updated Vital Signs BP (!) 142/93   Pulse 63   Temp 98 F (36.7 C)   Resp 16   Ht 6' 1 (1.854 m)   Wt 88.5 kg   SpO2 98%   BMI 25.73 kg/m   Physical Exam  (all labs ordered are listed, but only abnormal results are displayed) Labs Reviewed  COMPREHENSIVE METABOLIC PANEL WITH GFR - Abnormal; Notable for the following components:      Result Value   CO2 20 (*)    Total Bilirubin 1.4 (*)    All other components within normal limits  URINALYSIS, ROUTINE W REFLEX MICROSCOPIC - Abnormal; Notable for the following  components:   Color, Urine STRAW (*)    Specific Gravity, Urine 1.003 (*)    All other components within normal limits  CBC  CBG MONITORING, ED  TROPONIN I (HIGH SENSITIVITY)    EKG: EKG Interpretation Date/Time:  Saturday October 21 2023 13:21:53 EDT Ventricular Rate:  67 PR Interval:  152 QRS Duration:  130 QT Interval:  474 QTC Calculation: 500 R Axis:   34  Text Interpretation: Normal sinus rhythm Right bundle branch block Abnormal ECG When compared with ECG of 16-Oct-2023 07:56, PREVIOUS ECG IS PRESENT No significant change since last tracing Confirmed by Dean Clarity [phone]) on 10/21/2023 1:27:21 PM  Radiology: No results found.  {Document cardiac monitor, telemetry assessment procedure when  appropriate:32947} Procedures   Medications Ordered in the ED - No data to display    {Click here for ABCD2, HEART and other calculators REFRESH Note before signing:1}                              Medical Decision Making Amount and/or Complexity of Data Reviewed Labs: ordered.  Risk Prescription drug management.   Patient with anxiety and hypertension.  He will start back on 1 metoprolol  a day and tak Vistaril  for anxiety and follow-up with his cardiologist  {Document critical care time when appropriate  Document review of labs and clinical decision tools ie CHADS2VASC2, etc  Document your independent review of radiology images and any outside records  Document your discussion with family members, caretakers and with consultants  Document social determinants of health affecting pt's care  Document your decision making why or why not admission, treatments were needed:32947:::1}   Final diagnoses:  Hypertension, unspecified type  Anxiety    ED Discharge Orders          Ordered    hydrOXYzine  (ATARAX ) 25 MG tablet        10/21/23 1635

## 2023-10-21 NOTE — ED Notes (Signed)
 Pt/family received d/c paperwork at this time. After going over the paperwork any questions, comments, or concerns were answered to the best of this nurse's knowledge. The pt/family verbally acknowledged the teachings/instructions.

## 2023-10-21 NOTE — ED Triage Notes (Signed)
 Pt feels like he is going to pass out all day, denies LOC. Denies CP. Pt with recent cardiac cath with stent placement.

## 2023-10-24 NOTE — Telephone Encounter (Signed)
 Patient informed and verbalized understanding of plan. Patient has f/u appointment with PA on Thursday advised patient to discuss these concerns then.

## 2023-10-26 ENCOUNTER — Encounter: Payer: Self-pay | Admitting: Physician Assistant

## 2023-10-26 ENCOUNTER — Ambulatory Visit: Attending: Physician Assistant | Admitting: Physician Assistant

## 2023-10-26 VITALS — BP 123/75 | HR 66 | Ht 73.0 in | Wt 199.0 lb

## 2023-10-26 DIAGNOSIS — I10 Essential (primary) hypertension: Secondary | ICD-10-CM

## 2023-10-26 DIAGNOSIS — I2510 Atherosclerotic heart disease of native coronary artery without angina pectoris: Secondary | ICD-10-CM

## 2023-10-26 DIAGNOSIS — I493 Ventricular premature depolarization: Secondary | ICD-10-CM | POA: Diagnosis not present

## 2023-10-26 DIAGNOSIS — E785 Hyperlipidemia, unspecified: Secondary | ICD-10-CM

## 2023-10-26 DIAGNOSIS — R42 Dizziness and giddiness: Secondary | ICD-10-CM

## 2023-10-26 NOTE — Patient Instructions (Addendum)
 Medication Instructions:  Stop Metoprolol  Tartrate as directed Start taking Losartan  25 mg at night. May start tomorrow since you've taken your Losartan  tablet this morning.  *If you need a refill on your cardiac medications before your next appointment, please call your pharmacy*  Lab Work: NONE ordered at this time of appointment   Testing/Procedures: NONE ordered at this time of appointment   Follow-Up: At [facility], you and your health needs are our priority.  As part of our continuing mission to provide you with exceptional heart care, our providers are all part of one team.  This team includes your primary Cardiologist (physician) and Advanced Practice Providers or APPs (Physician Assistants and Nurse Practitioners) who all work together to provide you with the care you need, when you need it.  Your next appointment:    Keep follow up   Provider:   Vishnu P Mallipeddi, MD    We recommend signing up for the patient portal called MyChart.  Sign up information is provided on this After Visit Summary.  MyChart is used to connect with patients for Virtual Visits (Telemedicine).  Patients are able to view lab/test results, encounter notes, upcoming appointments, etc.  Non-urgent messages can be sent to your provider as well.   To learn more about what you can do with MyChart, go to [email or web address].

## 2023-10-26 NOTE — Progress Notes (Unsigned)
 Cardiology Office Note   Date:  10/26/2023  ID:  Thomas Summers, DOB 06/15/1955, MRN [id number] PCP: Alston Silvio BROCKS, FNP  [facility] Providers Cardiologist:  Diannah SHAUNNA Maywood, MD { Click to update primary MD,subspecialty MD or APP then REFRESH:1}    History of Present Illness Thomas Summers is a 68 y.o. male with past medical history of hypertension, hyperlipidemia, DM2, CAD, and cardiomyopathy with baseline EF of 40 to 45%.  Patient was initially referred to cardiology service for evaluation of elevated proBNP.  He had symptom of dizziness.  Event monitor showed a 22.9% PVC burden, 69 runs of NSVT fastest lasting 10 beats and longest lasting 18 beats.  There is also ventricular bigeminy and trigeminy.  He had a 4.9% PAC burden and 49 runs of nonsustained SVT.  He was started on metoprolol  tartrate 25 mg twice a day and eventually amiodarone  200 mg twice a day due to frequent PVCs on the metoprolol .  Echocardiogram showed EF 40 to 45%, no significant valvular disease.  Cardiac CT showed coronary calcium  score of 3167 7 which placed the patient in 97th percentile for age and sex matched control, extensive total plaque volume, severe flow-limiting stenosis in the proximal to mid LAD and OM1.  Dizziness improved after starting amiodarone  therapy. He was last seen by Dr. Mallipeddi on 10/04/2023 who recommended proceed with cardiac catheterization due to abnormal coronary CT.  It was also recommended for him to proceed with a sleep study.  Cardiac catheterization performed on 10/11/2023 showed a 40% mid RCA lesion, 30% mid left circumflex lesion, 30% ostial left circumflex lesion, 40% ostial to mid LAD lesion, 80 percent mid LAD lesion, 99% OM2 lesion which was too small to fix.  Mid LAD lesion was treated with PTCA and drug-eluting stent.  Patient returned back to the ED 4 days later on 10/16/2023 due to dizziness and chest pain.  Serial troponin was 218-->205.  EKG showed no acute changes.   Patient was seen by Dr. Dorn Ross who recommended continue medical therapy, and if recurrent symptoms may consider adjustment of antianginal as outpatient.  He was instructed to increase amiodarone  to 400 mg twice a day for 2 weeks then 200 mg twice a day thereafter.  Patient presents today for evaluation of dizziness.  He denies any chest pain.  He has been compliant with his antiplatelet medication.  At the recommendation of Dr. Dorn Ross in the emergency room, he has increased amiodarone  to 400 mg twice a day and it will go back down to 200 mg twice a day next week.  He felt good for the past 2 days, however this morning he had recurrent dizzy spell and feels very off balance.  He has intermittent blurry vision, his strength is equal bilaterally.  He denies any prior history of stroke.  Orthostatic vital signs normal.  Blood pressure on manual recheck by myself was 120/64.  I will stop his metoprolol  tartrate 12.5 mg twice a day for the time being to allow his blood pressure to drift higher.  If symptoms persist on the next follow-up, may need to consider stopping the losartan  and consider CT of the head.  I checked his groin, he does have a large hematoma but there is no sign of pseudoaneurysm.  There is no pulsatile mass or bruit on exam.  He can follow-up with Dr. Bettyjo as scheduled.  ROS: ***  Studies Reviewed      *** Risk Assessment/Calculations {Does this patient have  ATRIAL FIBRILLATION?:[phone]}     STOP-Bang Score:  4  { Consider Dx Sleep Disordered Breathing or Sleep Apnea  ICD G47.33          :1}    Physical Exam VS:  BP (!) 110/58 (BP Location: Left Arm, Patient Position: Sitting)   Pulse 67   Ht 6' 1 (1.854 m)   Wt 199 lb (90.3 kg)   SpO2 98%   BMI 26.25 kg/m        Wt Readings from Last 3 Encounters:  10/26/23 199 lb (90.3 kg)  10/21/23 195 lb (88.5 kg)  10/16/23 199 lb 15.3 oz (90.7 kg)    GEN: Well nourished, well developed in no acute  distress NECK: No JVD; No carotid bruits CARDIAC: ***RRR, no murmurs, rubs, gallops RESPIRATORY:  Clear to auscultation without rales, wheezing or rhonchi  ABDOMEN: Soft, non-tender, non-distended EXTREMITIES:  No edema; No deformity   ASSESSMENT AND PLAN ***    {Are you ordering a CV Procedure (e.g. stress test, cath, DCCV, TEE, etc)?   Press F2        :[id number]}  Dispo: ***  Signed, Scot Ford, PA

## 2023-10-31 ENCOUNTER — Telehealth: Payer: Self-pay | Admitting: Internal Medicine

## 2023-10-31 NOTE — Telephone Encounter (Signed)
*  STAT* If patient is at the pharmacy, call can be transferred to refill team.   1. Which medications need to be refilled? (please list name of each medication and dose if known) losartan (COZAAR) 25 MG tablet   2. Which pharmacy/location (including street and city if local pharmacy) is medication to be sent to?  [place], [place]    3. Do they need a 30 day or 90 day supply? [place]

## 2023-11-01 MED ORDER — LOSARTAN POTASSIUM 25 MG PO TABS
25.0000 mg | ORAL_TABLET | Freq: Every day | ORAL | 3 refills | Status: DC
Start: 2023-11-01 — End: 2023-11-07

## 2023-11-01 NOTE — Telephone Encounter (Signed)
 Refill of Losartan  has been sent to [facility], per pt's request.

## 2023-11-06 ENCOUNTER — Telehealth: Payer: Self-pay | Admitting: Internal Medicine

## 2023-11-06 NOTE — Telephone Encounter (Signed)
 Pt c/o BP issue: STAT if pt c/o blurred vision, one-sided weakness or slurred speech.  STAT if BP is GREATER than 180/120 TODAY.  STAT if BP is LESS than 90/60 and SYMPTOMATIC TODAY  1. What is your BP concern? Pt concerned that his bp is high and having other symptoms below   2. Have you taken any BP medication today? Takes bp meds at night, has taken it today yet   3. What are your last 5 BP readings?   155/86 146/80   4. Are you having any other symptoms (ex. Dizziness, headache, blurred vision, passed out)? Dizziness, blurry vision, feeling shaky

## 2023-11-06 NOTE — Telephone Encounter (Signed)
 Spoke with patient stated that his BP is elevated this morning. Makes him feel dizzy and shaky. Takes his Losartan  at night . Taken his Amiodarone  and Plavix  this morning as well as Hydroxyzine  last night and this morning.   Dr. Mallipeddi looked at his chart and recommended that he keep his appointment and if we can get him in sooner will be good so she can see him in person. Did give ER precautions, but trying to keep him from going to avoid his medications being changed. She reviewed his EKGs from last 2 ER visits and she stated that they were fine no PVCs and HR is controlled.   Advised patient of her recommendations and he verbalized understanding. Will call patient if she has a cancellations before the 12th.

## 2023-11-07 ENCOUNTER — Ambulatory Visit: Attending: Internal Medicine

## 2023-11-07 ENCOUNTER — Other Ambulatory Visit: Payer: Self-pay | Admitting: Internal Medicine

## 2023-11-07 ENCOUNTER — Ambulatory Visit: Attending: Internal Medicine | Admitting: Internal Medicine

## 2023-11-07 ENCOUNTER — Encounter (HOSPITAL_COMMUNITY): Payer: Self-pay

## 2023-11-07 ENCOUNTER — Telehealth: Payer: Self-pay | Admitting: Internal Medicine

## 2023-11-07 ENCOUNTER — Encounter: Payer: Self-pay | Admitting: Internal Medicine

## 2023-11-07 VITALS — BP 138/82 | HR 77 | Ht 73.0 in | Wt 197.8 lb

## 2023-11-07 DIAGNOSIS — R42 Dizziness and giddiness: Secondary | ICD-10-CM

## 2023-11-07 DIAGNOSIS — Z955 Presence of coronary angioplasty implant and graft: Secondary | ICD-10-CM | POA: Insufficient documentation

## 2023-11-07 DIAGNOSIS — G4733 Obstructive sleep apnea (adult) (pediatric): Secondary | ICD-10-CM

## 2023-11-07 MED ORDER — AMIODARONE HCL 200 MG PO TABS: 200.0000 mg | ORAL_TABLET | Freq: Every day | ORAL | 5 refills | Status: DC

## 2023-11-07 MED ORDER — LOSARTAN POTASSIUM 25 MG PO TABS: 25.0000 mg | ORAL_TABLET | Freq: Two times a day (BID) | ORAL | 3 refills | Status: DC

## 2023-11-07 NOTE — Progress Notes (Signed)
 Cardiology Office Note  Date: 11/07/2023   ID: Thomas Summers, DOB 1955-11-21, MRN [id number]  PCP:  Alston Silvio BROCKS, FNP  Cardiologist:  Angelus Hoopes P Moneka Mcquinn, MD Electrophysiologist:  None   History of Present Illness: Thomas Summers is a 68 y.o. male known to have HTN, DM 2, CAD s/p mid LAD PCI in 10/2023, new onset cardiomyopathy with LVEF 40 to 45%.  HLD is here for follow-up visit.  Initially referred to cardiology clinic for evaluation of elevated proBNP.  But he did not have any symptoms of DOE.  However he did have symptoms of dizziness for which event monitor was performed that showed  22.9% PVC burden, 69 runs of NSVT (fastest lasting 10 beats and the longest lasting 18 beats).  Symptomatic with NSR, PAC, PVC, ventricular bigeminy and ventricular trigeminy.  He also had 4.9% PAC burden and 49 runs of nonsustained SVT.  He was started on metoprolol  tartrate 25 mg BID and eventually amiodarone  200 mg twice daily due to frequent PVCs on metoprolol .  Echocardiogram showed LVEF 40 to 45%, no valvular heart disease and CVP was 8 mmHg.  CT cardiac showed coronary artery calcium  score of 3167 (97th percentile for age and sex matched control), extensive total plaque volume and severe flow-limiting stenosis in the proximal to mid LAD and OM1.  He underwent PCI to mid LAD in July 2025.  Since then, he had 3 ER visits, seen by cardiology and amiodarone  dose was increased due to PVCs.  Subsequent EKG showed normal sinus rhythm and no PVCs.  He is here today for follow-up visit.  He has been complaining of dizziness lasting for the whole day intermittently since stent placement in July 2025.  He reports that his blood pressure always is elevated whenever he feels dizzy.  He checked his blood pressures and was noted to be around 150 to 160 mmHg SBP at home.  No angina.  No DOE.  Tolerating medications well.  No leg swelling.  Past Medical History:  Diagnosis Date   Allergy    Depression     Diabetes mellitus without complication (HCC)    Heart murmur    Hyperlipidemia    Hypertension     Past Surgical History:  Procedure Laterality Date   COLONOSCOPY N/A 08/13/2015   Procedure: COLONOSCOPY;  Surgeon: Claudis RAYMOND Rivet, MD;  Location: [facility];  Service: Endoscopy;  Laterality: N/A;  1200   COLONOSCOPY  08/13/2015   [facility] - Dr.Rehman   COLONOSCOPY N/A 01/14/2021   Procedure: COLONOSCOPY;  Surgeon: Rivet Claudis RAYMOND, MD;  Location: [facility];  Service: Endoscopy;  Laterality: N/A;  7:30   CORONARY STENT INTERVENTION N/A 10/11/2023   Procedure: CORONARY STENT INTERVENTION;  Surgeon: Verlin Lonni BIRCH, MD;  Location: [facility];  Service: Cardiovascular;  Laterality: N/A;   LEFT HEART CATH AND CORONARY ANGIOGRAPHY N/A 10/11/2023   Procedure: LEFT HEART CATH AND CORONARY ANGIOGRAPHY;  Surgeon: Verlin Lonni BIRCH, MD;  Location: [facility];  Service: Cardiovascular;  Laterality: N/A;   MENISCUS REPAIR Left 1997   OTHER SURGICAL HISTORY     left ear surgery   OTHER SURGICAL HISTORY     fracture arm   POLYPECTOMY  01/14/2021   Procedure: POLYPECTOMY;  Surgeon: Rivet Claudis RAYMOND, MD;  Location: [facility];  Service: Endoscopy;;   TONSILLECTOMY      Current Outpatient Medications  Medication Sig Dispense Refill   amiodarone  (PACERONE ) 200 MG tablet Take 1  tablet (200 mg total) by mouth 2 (two) times daily. For the next 2 weeks, take 400 mg twice daily.  Go back to 200 mg twice daily after that. 60 tablet 5   aspirin  EC 81 MG tablet Take 1 tablet (81 mg total) by mouth daily. Swallow whole. 30 tablet 5   atorvastatin  (LIPITOR) 80 MG tablet Take 1 tablet (80 mg total) by mouth at bedtime. 90 tablet 3   blood glucose meter kit and supplies KIT Dispense based on patient and insurance preference. ( One touch ) diabetes type 2. E11.9. Test blood sugar once a day 1 each 5   clopidogrel  (PLAVIX ) 75 MG tablet Take 1 tablet (75 mg total) by mouth daily with  breakfast. 90 tablet 3   fluticasone  (FLONASE ) 50 MCG/ACT nasal spray Place 1 spray into both nostrils daily as needed for allergies or rhinitis.     glucose blood test strip Use 1x per day as directed. 100 each 3   hydrOXYzine  (ATARAX ) 25 MG tablet Take one every 12 hours for anxiety or  sleep 20 tablet 1   losartan  (COZAAR ) 25 MG tablet Take 1 tablet (25 mg total) by mouth daily. 90 tablet 3   metFORMIN (GLUCOPHAGE) 500 MG tablet Take 500 mg by mouth daily.     nitroGLYCERIN  (NITROSTAT ) 0.4 MG SL tablet Place 1 tablet (0.4 mg total) under the tongue every 5 (five) minutes as needed for chest pain. 25 tablet 3   OneTouch Delica Lancets 33G MISC USE ONE  TO CHECK GLUCOSE ONCE DAILY 100 each 1   silodosin  (RAPAFLO ) 8 MG CAPS capsule TAKE 1 CAPSULE BY MOUTH ONCE DAILY WITH BREAKFAST 30 capsule 0   No current facility-administered medications for this visit.   Allergies:  Patient has no known allergies.   Social History: The patient  reports that he has never smoked. He has never used smokeless tobacco. He reports that he does not drink alcohol and does not use drugs.   Family History: The patient's family history includes Crohn's disease in his daughter; Healthy in his brother, brother, daughter, mother, sister, and sister; Heart disease in his father; Hypertension in his father; Migraines in his daughter.   ROS:  Please see the history of present illness. Otherwise, complete review of systems is positive for none  All other systems are reviewed and negative.   Physical Exam: VS:  BP 138/82   Pulse 77   Ht 6' 1 (1.854 m)   Wt 197 lb 12.8 oz (89.7 kg)   SpO2 98%   BMI 26.10 kg/m , BMI Body mass index is 26.1 kg/m.  Wt Readings from Last 3 Encounters:  11/07/23 197 lb 12.8 oz (89.7 kg)  10/26/23 199 lb (90.3 kg)  10/21/23 195 lb (88.5 kg)    General: Patient appears comfortable at rest. HEENT: Conjunctiva and lids normal, oropharynx clear with moist mucosa. Neck: Supple, no elevated  JVP or carotid bruits, no thyromegaly. Lungs: Clear to auscultation, nonlabored breathing at rest. Cardiac: Regular rate and rhythm, no S3 or significant systolic murmur, no pericardial rub. Abdomen: Soft, nontender, no hepatomegaly, bowel sounds present, no guarding or rebound. Extremities: No pitting edema, distal pulses 2+. Skin: Warm and dry. Musculoskeletal: No kyphosis. Neuropsychiatric: Alert and oriented x3, affect grossly appropriate.  Recent Labwork: 08/24/2023: Magnesium 2.2 10/16/2023: TSH 1.212 10/21/2023: ALT 30; AST 22; BUN 17; Creatinine, Ser 0.74; Hemoglobin 14.8; Platelets 172; Potassium 3.7; Sodium 138     Component Value Date/Time   CHOL 109 10/12/2023  0800   CHOL 149 11/10/2020 0806   TRIG 66 10/12/2023 0800   HDL 49 10/12/2023 0800   HDL 63 11/10/2020 0806   CHOLHDL 2.2 10/12/2023 0800   VLDL 13 10/12/2023 0800   LDLCALC 47 10/12/2023 0800   LDLCALC 71 11/10/2020 0806    Assessment and Plan:  CAD - s/p mid LAD PCI in July 25 - CT cardiac showed flow-limiting stenosis in proximal to mid LAD and OM1. - Echocardiogram showed LVEF 40 to 45%. - Continue DAPT, aspirin  81 mg once daily and Plavix  75 mg once daily.  Recommendation per IC was to complete 1 year DAPT.  If he needs to undergo any time sensitive surgeries, will need to complete at least 6 months of uninterrupted DAPT. - Continue atorvastatin  80 mg nightly. - Cardiac rehab.  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  This occurs few times a week. - EKG showed NSR, no PVCs. - Obtain 2-week event monitor, live. - Not on BB anymore.  Currently on amiodarone  200 mg twice daily, will decrease frequency to 200 mg once daily. - Home sleep study was inconclusive.  Split-night sleep study was ordered around 5 weeks ago but no one called him.  Will follow.  Ischemic cardiomyopathy LVEF 40 to 45% -  Likely secondary to significant PVC burden and CAD - Not on BB, increase losartan  frequency from 25 mg once daily to twice daily. - Will repeat echocardiogram in couple of months.  HLD, at goal - Continue atorvastatin  40 mg nightly.  Total plaque volume is extensive.  Goal LDL will be less than 55.  LDL 47 in 2025.  HTN, poorly controlled - Increase losartan  frequency from 25 mg once daily to twice daily.  DM 2, controlled - On metformin, follow-up with PCP.   30 minutes spent with patient discussing his symptoms of dizziness, HTN, medications.  I answered all his questions.  10 minutes spent in documentation.     Medication Adjustments/Labs and Tests Ordered: Current medicines are reviewed at length with the patient today.  Concerns regarding medicines are outlined above.    Disposition:  Follow up 3 months  Signed Amaziah Ghosh Priya Lashe Oliveira, MD, 11/07/2023 10:17 AM    [facility] at [facility] [address] [place], [place], [place] [address]

## 2023-11-07 NOTE — Patient Instructions (Addendum)
 Medication Instructions:  Your physician has recommended you make the following change in your medication:  Decrease Amiodarone  200 mg twice daily to once daily Increase Losartan  25 mg once daily to twice daily Continue taking all other medications as prescribed  Labwork: None  Testing/Procedures: Your physician has recommended that you have a sleep study. This test records several body functions during sleep, including: brain activity, eye movement, oxygen and carbon dioxide blood levels, heart rate and rhythm, breathing rate and rhythm, the flow of air through your mouth and nose, snoring, body muscle movements, and chest and belly movement.  Your physician has recommended that you wear a Zio monitor.   This monitor is a medical device that records the heart's electrical activity. Doctors most often use these monitors to diagnose arrhythmias. Arrhythmias are problems with the speed or rhythm of the heartbeat. The monitor is a small device applied to your chest. You can wear one while you do your normal daily activities. While wearing this monitor if you have any symptoms to push the button and record what you felt. Once you have worn this monitor for the period of time provider prescribed (for 14 days), you will return the monitor device in the postage paid box. Once it is returned they will download the data collected and provide us  with a report which the provider will then review and we will call you with those results. Important tips:  Avoid showering during the first 24 hours of wearing the monitor. Avoid excessive sweating to help maximize wear time. Do not submerge the device, no hot tubs, and no swimming pools. Keep any lotions or oils away from the patch. After 24 hours you may shower with the patch on. Take brief showers with your back facing the shower head.  Do not remove patch once it has been placed because that will interrupt data and decrease adhesive wear time. Push the button  when you have any symptoms and write down what you were feeling. Once you have completed wearing your monitor, remove and place into box which has postage paid and place in your outgoing mailbox.  If for some reason you have misplaced your box then call our office and we can provide another box and/or mail it off for you.   Follow-Up: Your physician recommends that you schedule a follow-up appointment in: 3 months  Any Other Special Instructions Will Be Listed Below (If Applicable). Thank you for choosing [facility]!     If you need a refill on your cardiac medications before your next appointment, please call your pharmacy.

## 2023-11-07 NOTE — Telephone Encounter (Signed)
 Checking percert on the following    LONG TERM MONITOR X 2 weeks

## 2023-11-10 ENCOUNTER — Encounter (HOSPITAL_COMMUNITY)
Admission: RE | Admit: 2023-11-10 | Discharge: 2023-11-10 | Disposition: A | Discharge status: Home / Self Care | Source: Ambulatory Visit | Attending: Internal Medicine | Admitting: Internal Medicine

## 2023-11-10 DIAGNOSIS — Z955 Presence of coronary angioplasty implant and graft: Secondary | ICD-10-CM | POA: Insufficient documentation

## 2023-11-10 NOTE — Progress Notes (Signed)
 Virtual orientation visit completed for cardiac rehab with stent placement. On-site orientation visit scheduled for 11/14/23 at 0830.

## 2023-11-13 ENCOUNTER — Telehealth: Payer: Self-pay | Admitting: Student in an Organized Health Care Education/Training Program

## 2023-11-13 NOTE — Telephone Encounter (Signed)
 Recent medication changes with losartan  25 mg PO daily to bid and amio decreased 400 mg daily to 200 mg daily. sBP 150s and despite losartan  still 160/80s. No anginal sx. BP better controlled when on lisinopril  and metoprolol . Would like to avoid going back to lisinopril  since the plan looks to be to transition to entresto. He will reach out to Dr. Mallipeddi to discuss further.

## 2023-11-14 ENCOUNTER — Encounter (HOSPITAL_COMMUNITY)
Admission: RE | Admit: 2023-11-14 | Discharge: 2023-11-14 | Disposition: A | Discharge status: Home / Self Care | Source: Ambulatory Visit | Attending: Internal Medicine

## 2023-11-14 ENCOUNTER — Ambulatory Visit: Admitting: Internal Medicine

## 2023-11-14 VITALS — Ht 74.0 in | Wt 196.8 lb

## 2023-11-14 DIAGNOSIS — Z955 Presence of coronary angioplasty implant and graft: Secondary | ICD-10-CM | POA: Diagnosis present

## 2023-11-14 NOTE — Progress Notes (Signed)
 Cardiac Individual Treatment Plan  Patient Details  Name: Thomas Summers MRN: [id number] Date of Birth: 05/14/1955 Referring Provider:   Flowsheet Row CARDIAC REHAB PHASE II ORIENTATION from 11/14/2023 in [facility] CARDIAC REHABILITATION  Referring Provider Stacia Diannah Liv MD    Initial Encounter Date:  Flowsheet Row CARDIAC REHAB PHASE II ORIENTATION from 11/14/2023 in [place] CARDIAC REHABILITATION  Date 11/14/23    Visit Diagnosis: Status post coronary artery stent placement  Patient's Home Medications on Admission:  Current Outpatient Medications:    amiodarone  (PACERONE ) 200 MG tablet, Take 1 tablet (200 mg total) by mouth daily. For the next 2 weeks, take 400 mg twice daily.  Go back to 200 mg twice daily after that., Disp: 30 tablet, Rfl: 5   aspirin  EC 81 MG tablet, Take 1 tablet (81 mg total) by mouth daily. Swallow whole., Disp: 30 tablet, Rfl: 5   atorvastatin  (LIPITOR) 80 MG tablet, Take 1 tablet (80 mg total) by mouth at bedtime., Disp: 90 tablet, Rfl: 3   blood glucose meter kit and supplies KIT, Dispense based on patient and insurance preference. ( One touch ) diabetes type 2. E11.9. Test blood sugar once a day, Disp: 1 each, Rfl: 5   clopidogrel  (PLAVIX ) 75 MG tablet, Take 1 tablet (75 mg total) by mouth daily with breakfast., Disp: 90 tablet, Rfl: 3   fluticasone  (FLONASE ) 50 MCG/ACT nasal spray, Place 1 spray into both nostrils daily as needed for allergies or rhinitis., Disp: , Rfl:    glucose blood test strip, Use 1x per day as directed., Disp: 100 each, Rfl: 3   hydrOXYzine  (ATARAX ) 25 MG tablet, Take one every 12 hours for anxiety or  sleep, Disp: 20 tablet, Rfl: 1   losartan  (COZAAR ) 25 MG tablet, Take 1 tablet (25 mg total) by mouth 2 (two) times daily., Disp: 60 tablet, Rfl: 3   metFORMIN (GLUCOPHAGE) 500 MG tablet, Take 500 mg by mouth daily., Disp: , Rfl:    nitroGLYCERIN  (NITROSTAT ) 0.4 MG SL tablet, Place 1 tablet (0.4 mg total) under the  tongue every 5 (five) minutes as needed for chest pain., Disp: 25 tablet, Rfl: 3   OneTouch Delica Lancets 33G MISC, USE ONE  TO CHECK GLUCOSE ONCE DAILY, Disp: 100 each, Rfl: 1   silodosin  (RAPAFLO ) 8 MG CAPS capsule, TAKE 1 CAPSULE BY MOUTH ONCE DAILY WITH BREAKFAST, Disp: 30 capsule, Rfl: 0  Past Medical History: Past Medical History:  Diagnosis Date   Allergy    Depression    Diabetes mellitus without complication (HCC)    Heart murmur    Hyperlipidemia    Hypertension     Tobacco Use: Social History   Tobacco Use  Smoking Status Never  Smokeless Tobacco Never    Labs: Review Flowsheet  More data exists      Latest Ref Rng & Units 05/08/2019 12/18/2019 03/02/2020 11/10/2020 10/12/2023  Labs for ITP Cardiac and Pulmonary Rehab  Cholestrol 0 - 200 mg/dL 801  871  - 850  890   LDL (calc) 0 - 99 mg/dL 873  60  - 71  47   HDL-C >40 mg/dL 54  53  - 63  49   Trlycerides <150 mg/dL 898  78  - 77  66   Hemoglobin A1c 4.8 - 5.6 % 6.6  6.5  5.7  5.9  5.6      Exercise Target Goals: Exercise Program Goal: Individual exercise prescription set using results from initial 6 min walk test and  THRR while considering  patient's activity barriers and safety.   Exercise Prescription Goal: Initial exercise prescription builds to 30-45 minutes a day of aerobic activity, 2-3 days per week.  Home exercise guidelines will be given to patient during program as part of exercise prescription that the participant will acknowledge.   Education: Aerobic Exercise: - Group verbal and visual presentation on the components of exercise prescription. Introduces F.I.T.T principle from ACSM for exercise prescriptions.  Reviews F.I.T.T. principles of aerobic exercise including progression. Written material provided at class time.   Education: Resistance Exercise: - Group verbal and visual presentation on the components of exercise prescription. Introduces F.I.T.T principle from ACSM for exercise prescriptions   Reviews F.I.T.T. principles of resistance exercise including progression. Written material provided at class time.    Education: Exercise & Equipment Safety: - Individual verbal instruction and demonstration of equipment use and safety with use of the equipment.   Education: Exercise Physiology & General Exercise Guidelines: - Group verbal and written instruction with models to review the exercise physiology of the cardiovascular system and associated critical values. Provides general exercise guidelines with specific guidelines to those with heart or lung disease. Written material provided at class time.   Education: Flexibility, Balance, Mind/Body Relaxation: - Group verbal and visual presentation with interactive activity on the components of exercise prescription. Introduces F.I.T.T principle from ACSM for exercise prescriptions. Reviews F.I.T.T. principles of flexibility and balance exercise training including progression. Also discusses the mind body connection.  Reviews various relaxation techniques to help reduce and manage stress (i.e. Deep breathing, progressive muscle relaxation, and visualization). Balance handout provided to take home. Written material provided at class time.   Activity Barriers & Risk Stratification:  Activity Barriers & Cardiac Risk Stratification - 11/14/23 1008       Activity Barriers & Cardiac Risk Stratification   Activity Barriers Balance Concerns;Deconditioning;Muscular Weakness;Decreased Ventricular Function;Other (comment)    Comments dizziness/lightheaded as blood pressure increases    Cardiac Risk Stratification Moderate          6 Minute Walk:  6 Minute Walk     Row Name 11/14/23 0959         6 Minute Walk   Phase Initial     Distance 1245 feet     Walk Time 6 minutes     # of Rest Breaks 0     MPH 2.36     METS 3.49     RPE 13     VO2 Peak 12.23     Symptoms Yes (comment)     Comments dizzy/lightheaded from increased blood  pressure, relieved as pressure returned to normal     Resting HR 86 bpm     Resting BP 144/62     Resting Oxygen Saturation  98 %     Exercise Oxygen Saturation  during 6 min walk 98 %     Max Ex. HR 102 bpm     Max Ex. BP 170/74     2 Minute Post BP 138/60        Oxygen Initial Assessment:   Oxygen Re-Evaluation:   Oxygen Discharge (Final Oxygen Re-Evaluation):   Initial Exercise Prescription:  Initial Exercise Prescription - 11/14/23 1000       Date of Initial Exercise RX and Referring Provider   Date 11/14/23    Referring Provider Mallipeddi, Vishnu Pyria MD      Oxygen   Maintain Oxygen Saturation 88% or higher      Treadmill   MPH 2.3  Grade 1    Minutes 15    METs 3.08      REL-XR   Level 3    Speed 50    Minutes 15    METs 3      Prescription Details   Frequency (times per week) 3    Duration Progress to 30 minutes of continuous aerobic without signs/symptoms of physical distress      Intensity   THRR 40-80% of Max Heartrate 112-139    Ratings of Perceived Exertion 11-13    Perceived Dyspnea 0-4      Progression   Progression Continue to progress workloads to maintain intensity without signs/symptoms of physical distress.      Resistance Training   Training Prescription Yes    Weight 5 lb    Reps 10-15          Perform Capillary Blood Glucose checks as needed.  Exercise Prescription Changes:   Exercise Prescription Changes     Row Name 11/14/23 1000             Response to Exercise   Blood Pressure (Admit) 144/62       Blood Pressure (Exercise) 170/74       Blood Pressure (Exit) 138/60       Heart Rate (Admit) 86 bpm       Heart Rate (Exercise) 102 bpm       Heart Rate (Exit) 102 bpm       Oxygen Saturation (Admit) 98 %       Oxygen Saturation (Exercise) 98 %       Rating of Perceived Exertion (Exercise) 13       Symptoms dizzy/lightheaded from high BP       Comments walk test results          Exercise  Comments:   Exercise Goals and Review:   Exercise Goals     Row Name 11/14/23 1012             Exercise Goals   Increase Physical Activity Yes       Intervention Provide advice, education, support and counseling about physical activity/exercise needs.;Develop an individualized exercise prescription for aerobic and resistive training based on initial evaluation findings, risk stratification, comorbidities and participant's personal goals.       Expected Outcomes Short Term: Attend rehab on a regular basis to increase amount of physical activity.;Long Term: Exercising regularly at least 3-5 days a week.;Long Term: Add in home exercise to make exercise part of routine and to increase amount of physical activity.       Increase Strength and Stamina Yes       Intervention Provide advice, education, support and counseling about physical activity/exercise needs.;Develop an individualized exercise prescription for aerobic and resistive training based on initial evaluation findings, risk stratification, comorbidities and participant's personal goals.       Expected Outcomes Short Term: Increase workloads from initial exercise prescription for resistance, speed, and METs.;Short Term: Perform resistance training exercises routinely during rehab and add in resistance training at home;Long Term: Improve cardiorespiratory fitness, muscular endurance and strength as measured by increased METs and functional capacity ( )       Able to understand and use rate of perceived exertion (RPE) scale Yes       Intervention Provide education and explanation on how to use RPE scale       Expected Outcomes Short Term: Able to use RPE daily in rehab to express subjective intensity level;Long Term:  Able  to use RPE to guide intensity level when exercising independently       Able to understand and use Dyspnea scale Yes       Intervention Provide education and explanation on how to use Dyspnea scale       Expected  Outcomes Short Term: Able to use Dyspnea scale daily in rehab to express subjective sense of shortness of breath during exertion;Long Term: Able to use Dyspnea scale to guide intensity level when exercising independently       Knowledge and understanding of Target Heart Rate Range (THRR) Yes       Intervention Provide education and explanation of THRR including how the numbers were predicted and where they are located for reference       Expected Outcomes Short Term: Able to state/look up THRR;Long Term: Able to use THRR to govern intensity when exercising independently;Short Term: Able to use daily as guideline for intensity in rehab       Able to check pulse independently Yes       Intervention Provide education and demonstration on how to check pulse in carotid and radial arteries.;Review the importance of being able to check your own pulse for safety during independent exercise       Expected Outcomes Short Term: Able to explain why pulse checking is important during independent exercise;Long Term: Able to check pulse independently and accurately       Understanding of Exercise Prescription Yes       Intervention Provide education, explanation, and written materials on patient's individual exercise prescription       Expected Outcomes Short Term: Able to explain program exercise prescription;Long Term: Able to explain home exercise prescription to exercise independently          Exercise Goals Re-Evaluation :   Discharge Exercise Prescription (Final Exercise Prescription Changes):  Exercise Prescription Changes - 11/14/23 1000       Response to Exercise   Blood Pressure (Admit) 144/62    Blood Pressure (Exercise) 170/74    Blood Pressure (Exit) 138/60    Heart Rate (Admit) 86 bpm    Heart Rate (Exercise) 102 bpm    Heart Rate (Exit) 102 bpm    Oxygen Saturation (Admit) 98 %    Oxygen Saturation (Exercise) 98 %    Rating of Perceived Exertion (Exercise) 13    Symptoms  dizzy/lightheaded from high BP    Comments walk test results          Nutrition:  Target Goals: Understanding of nutrition guidelines, daily intake of sodium 1500mg , cholesterol 200mg , calories 30% from fat and 7% or less from saturated fats, daily to have 5 or more servings of fruits and vegetables.  Education: Nutrition 1 -Group instruction provided by verbal, written material, interactive activities, discussions, models, and posters to present general guidelines for heart healthy nutrition including macronutrients, label reading, and promoting whole foods over processed counterparts. Education serves as Pensions consultant of discussion of heart healthy eating for all. Written material provided at class time.    Education: Nutrition 2 -Group instruction provided by verbal, written material, interactive activities, discussions, models, and posters to present general guidelines for heart healthy nutrition including sodium, cholesterol, and saturated fat. Providing guidance of habit forming to improve blood pressure, cholesterol, and body weight. Written material provided at class time.     Biometrics:  Pre Biometrics - 11/14/23 1012       Pre Biometrics   Height 6' 2 (1.88 m)    Weight 89.3  kg    Waist Circumference 42 inches    Hip Circumference 36 inches    Waist to Hip Ratio 1.17 %    BMI (Calculated) 25.26    Grip Strength 27.4 kg    Single Leg Stand 7.2 seconds           Nutrition Therapy Plan and Nutrition Goals:  Nutrition Therapy & Goals - 11/14/23 1013       Intervention Plan   Intervention Prescribe, educate and counsel regarding individualized specific dietary modifications aiming towards targeted core components such as weight, hypertension, lipid management, diabetes, heart failure and other comorbidities.;Nutrition handout(s) given to patient.    Expected Outcomes Short Term Goal: Understand basic principles of dietary content, such as calories, fat, sodium,  cholesterol and nutrients.;Long Term Goal: Adherence to prescribed nutrition plan.          Nutrition Assessments:  MEDIFICTS Score Key: >=70 Need to make dietary changes  40-70 Heart Healthy Diet <= 40 Therapeutic Level Cholesterol Diet  Flowsheet Row CARDIAC VIRTUAL BASED CARE from 11/10/2023 in [facility] CARDIAC REHABILITATION  Picture Your Plate Total Score on Admission 85   Picture Your Plate Scores: <59 Unhealthy dietary pattern with much room for improvement. 41-50 Dietary pattern unlikely to meet recommendations for good health and room for improvement. 51-60 More healthful dietary pattern, with some room for improvement.  >60 Healthy dietary pattern, although there may be some specific behaviors that could be improved.    Nutrition Goals Re-Evaluation:   Nutrition Goals Discharge (Final Nutrition Goals Re-Evaluation):   Psychosocial: Target Goals: Acknowledge presence or absence of significant depression and/or stress, maximize coping skills, provide positive support system. Participant is able to verbalize types and ability to use techniques and skills needed for reducing stress and depression.   Education: Stress, Anxiety, and Depression - Group verbal and visual presentation to define topics covered.  Reviews how body is impacted by stress, anxiety, and depression.  Also discusses healthy ways to reduce stress and to treat/manage anxiety and depression. Written material provided at class time.   Education: Sleep Hygiene -Provides group verbal and written instruction about how sleep can affect your health.  Define sleep hygiene, discuss sleep cycles and impact of sleep habits. Review good sleep hygiene tips.   Initial Review & Psychosocial Screening:  Initial Psych Review & Screening - 11/10/23 0900       Initial Review   Current issues with Current Sleep Concerns;Current Anxiety/Panic      Family Dynamics   Good Support System? Yes      Barriers    Psychosocial barriers to participate in program The patient should benefit from training in stress management and relaxation.;There are no identifiable barriers or psychosocial needs.      Screening Interventions   Interventions Encouraged to exercise;To provide support and resources with identified psychosocial needs;Provide feedback about the scores to participant    Expected Outcomes Short Term goal: Utilizing psychosocial counselor, staff and physician to assist with identification of specific Stressors or current issues interfering with healing process. Setting desired goal for each stressor or current issue identified.;Long Term Goal: Stressors or current issues are controlled or eliminated.;Short Term goal: Identification and review with participant of any Quality of Life or Depression concerns found by scoring the questionnaire.;Long Term goal: The participant improves quality of Life and PHQ9 Scores as seen by post scores and/or verbalization of changes          Quality of Life Scores:   Quality of Life -  11/10/23 1114       Quality of Life   Select Quality of Life      Quality of Life Scores   Health/Function Pre 21.43 %    Socioeconomic Pre 21.69 %    Psych/Spiritual Pre 20.64 %    Family Pre 24.7 %    GLOBAL Pre 21.18 %         Scores of 19 and below usually indicate a poorer quality of life in these areas.  A difference of  2-3 points is a clinically meaningful difference.  A difference of 2-3 points in the total score of the Quality of Life Index has been associated with significant improvement in overall quality of life, self-image, physical symptoms, and general health in studies assessing change in quality of life.  PHQ-9: Review Flowsheet  More data may exist      11/14/2023 06/01/2020 03/13/2020 04/12/2017 07/12/2016  Depression screen PHQ 2/9  Decreased Interest 0 0 0 0 0  Down, Depressed, Hopeless 0 0 0 0 0  PHQ - 2 Score 0 0 0 0 0  Altered sleeping 0 - - - -   Tired, decreased energy 3 - - - -  Change in appetite 0 - - - -  Feeling bad or failure about yourself  0 - - - -  Trouble concentrating 0 - - - -  Moving slowly or fidgety/restless 1 - - - -  Suicidal thoughts 0 - - - -  PHQ-9 Score 4 - - - -  Difficult doing work/chores Not difficult at all - - - -   Interpretation of Total Score  Total Score Depression Severity:  1-4 = Minimal depression, 5-9 = Mild depression, 10-14 = Moderate depression, 15-19 = Moderately severe depression, 20-27 = Severe depression   Psychosocial Evaluation and Intervention:  Psychosocial Evaluation - 11/10/23 0900       Psychosocial Evaluation & Interventions   Interventions Stress management education;Relaxation education;Encouraged to exercise with the program and follow exercise prescription    Comments Patient was referred to [facility] with stent placement. He denies any depression but has had some anxiety since his stent due to chronic dizziness and elevated B/P. He has been treated in the ED for this since his stent. His symptoms are thought to be caused by PVC burden. Dr. Mallipeddi adjusted his medications 8/6 and put him on a 10 day monitor. He says his dizziness has increased since the med change and plans to call her office today. He was also started on Hydroxyzine  for the anxiety but he says he has not used yet. He says he does not sleep well having trouble staying asleep. He is waiting on a sleep study. He lives with his wife who is his main support along with his chruch family and friends. His goals for the program are to increase his strength and continue to lose weight. He started a weight loss diet in March and has lost 25 lbs. He has no barriers identified to complete the program but is concerned about being able to exercise due to the dizziness.    Expected Outcomes Short Term: Patient will start the program and attend consistently. Long Term: Patient will complete the program meeting personal goals.     Continue Psychosocial Services  Follow up required by staff          Psychosocial Re-Evaluation:   Psychosocial Discharge (Final Psychosocial Re-Evaluation):   Vocational Rehabilitation: Provide vocational rehab assistance to qualifying candidates.   Vocational Rehab  Evaluation & Intervention:  Vocational Rehab - 11/10/23 9140       Initial Vocational Rehab Evaluation & Intervention   Assessment shows need for Vocational Rehabilitation No      Vocational Rehab Re-Evaulation   Comments Patient is retired.          Education: Education Goals: Education classes will be provided on a variety of topics geared toward better understanding of heart health and risk factor modification. Participant will state understanding/return demonstration of topics presented as noted by education test scores.  Learning Barriers/Preferences:  Learning Barriers/Preferences - 11/14/23 0846       Learning Barriers/Preferences   Learning Barriers Hearing    Learning Preferences Written Material          General Cardiac Education Topics:  AED/CPR: - Group verbal and written instruction with the use of models to demonstrate the basic use of the AED with the basic ABC's of resuscitation.   Test and Procedures: - Group verbal and visual presentation and models provide information about basic cardiac anatomy and function. Reviews the testing methods done to diagnose heart disease and the outcomes of the test results. Describes the treatment choices: Medical Management, Angioplasty, or Coronary Bypass Surgery for treating various heart conditions including Myocardial Infarction, Angina, Valve Disease, and Cardiac Arrhythmias. Written material provided at class time.   Medication Safety: - Group verbal and visual instruction to review commonly prescribed medications for heart and lung disease. Reviews the medication, class of the drug, and side effects. Includes the steps to properly store meds  and maintain the prescription regimen. Written material provided at class time.   Intimacy: - Group verbal instruction through game format to discuss how heart and lung disease can affect sexual intimacy. Written material provided at class time.   Know Your Numbers and Heart Failure: - Group verbal and visual instruction to discuss disease risk factors for cardiac and pulmonary disease and treatment options.  Reviews associated critical values for Overweight/Obesity, Hypertension, Cholesterol, and Diabetes.  Discusses basics of heart failure: signs/symptoms and treatments.  Introduces Heart Failure Zone chart for action plan for heart failure. Written material provided at class time.   Infection Prevention: - Provides verbal and written material to individual with discussion of infection control including proper hand washing and proper equipment cleaning during exercise session.   Falls Prevention: - Provides verbal and written material to individual with discussion of falls prevention and safety.   Other: -Provides group and verbal instruction on various topics (see comments)   Knowledge Questionnaire Score:  Knowledge Questionnaire Score - 11/10/23 1114       Knowledge Questionnaire Score   Pre Score 26/26          Core Components/Risk Factors/Patient Goals at Admission:  Personal Goals and Risk Factors at Admission - 11/14/23 1013       Core Components/Risk Factors/Patient Goals on Admission    Weight Management Yes;Weight Loss;Weight Maintenance    Intervention Weight Management: Develop a combined nutrition and exercise program designed to reach desired caloric intake, while maintaining appropriate intake of nutrient and fiber, sodium and fats, and appropriate energy expenditure required for the weight goal.;Weight Management: Provide education and appropriate resources to help participant work on and attain dietary goals.;Weight Management/Obesity: Establish reasonable  short term and long term weight goals.    Admit Weight 196 lb 12.8 oz (89.3 kg)    Goal Weight: Short Term 191 lb (86.6 kg)    Goal Weight: Long Term 190 lb (86.2 kg)  Expected Outcomes Short Term: Continue to assess and modify interventions until short term weight is achieved;Long Term: Adherence to nutrition and physical activity/exercise program aimed toward attainment of established weight goal;Weight Loss: Understanding of general recommendations for a balanced deficit meal plan, which promotes 1-2 lb weight loss per week and includes a negative energy balance of [phone] kcal/d;Understanding recommendations for meals to include 15-35% energy as protein, 25-35% energy from fat, 35-60% energy from carbohydrates, less than 200mg  of dietary cholesterol, 20-35 gm of total fiber daily;Understanding of distribution of calorie intake throughout the day with the consumption of 4-5 meals/snacks;Weight Maintenance: Understanding of the daily nutrition guidelines, which includes 25-35% calories from fat, 7% or less cal from saturated fats, less than 200mg  cholesterol, less than 1.5gm of sodium, & 5 or more servings of fruits and vegetables daily    Diabetes Yes    Intervention Provide education about signs/symptoms and action to take for hypo/hyperglycemia.;Provide education about proper nutrition, including hydration, and aerobic/resistive exercise prescription along with prescribed medications to achieve blood glucose in normal ranges: Fasting glucose 65-99 mg/dL    Expected Outcomes Short Term: Participant verbalizes understanding of the signs/symptoms and immediate care of hyper/hypoglycemia, proper foot care and importance of medication, aerobic/resistive exercise and nutrition plan for blood glucose control.;Long Term: Attainment of HbA1C < 7%.    Heart Failure Yes    Intervention Provide a combined exercise and nutrition program that is supplemented with education, support and counseling about heart  failure. Directed toward relieving symptoms such as shortness of breath, decreased exercise tolerance, and extremity edema.    Expected Outcomes Improve functional capacity of life;Short term: Attendance in program 2-3 days a week with increased exercise capacity. Reported lower sodium intake. Reported increased fruit and vegetable intake. Reports medication compliance.;Short term: Daily weights obtained and reported for increase. Utilizing diuretic protocols set by physician.;Long term: Adoption of self-care skills and reduction of barriers for early signs and symptoms recognition and intervention leading to self-care maintenance.    Hypertension Yes    Intervention Provide education on lifestyle modifcations including regular physical activity/exercise, weight management, moderate sodium restriction and increased consumption of fresh fruit, vegetables, and low fat dairy, alcohol moderation, and smoking cessation.;Monitor prescription use compliance.    Expected Outcomes Short Term: Continued assessment and intervention until BP is < 140/76mm HG in hypertensive participants. < 130/23mm HG in hypertensive participants with diabetes, heart failure or chronic kidney disease.;Long Term: Maintenance of blood pressure at goal levels.    Lipids Yes    Intervention Provide education and support for participant on nutrition & aerobic/resistive exercise along with prescribed medications to achieve LDL 70mg , HDL >40mg .    Expected Outcomes Short Term: Participant states understanding of desired cholesterol values and is compliant with medications prescribed. Participant is following exercise prescription and nutrition guidelines.;Long Term: Cholesterol controlled with medications as prescribed, with individualized exercise RX and with personalized nutrition plan. Value goals: LDL < 70mg , HDL > 40 mg.          Education:Diabetes - Individual verbal and written instruction to review signs/symptoms of diabetes,  desired ranges of glucose level fasting, after meals and with exercise. Acknowledge that pre and post exercise glucose checks will be done for 3 sessions at entry of program.   Core Components/Risk Factors/Patient Goals Review:    Core Components/Risk Factors/Patient Goals at Discharge (Final Review):    ITP Comments:  ITP Comments     Row Name 11/10/23 [phone] 11/14/23 [phone]  ITP Comments Virtual orientation visit completed for cardiac rehab with stent placement. On-site orientation visit scheduled for 11/14/23 at 0830. Patient attend orientation today.  Patient is attending Cardiac Rehabilitation Program.  Documentation for diagnosis can be found in [facility] 10/11/23.  Reviewed medical chart, RPE/RPD, gym safety, and program guidelines.  Patient was fitted to equipment they will be using during rehab.  Patient is scheduled to start exercise on Wednesday 11/15/23 at 1100.   Initial ITP created and sent for review and signature by Dr. Dorn Ross, Medical Director for Cardiac Rehabilitation Program.         Comments: Initial ITP

## 2023-11-14 NOTE — Patient Instructions (Signed)
 Patient Instructions  Patient Details  Name: Thomas Summers MRN: [id number] Date of Birth: May 24, 1955 Referring Provider:  Mallipeddi, Vishnu P, MD  Below are your personal goals for exercise, nutrition, and risk factors. Our goal is to help you stay on track towards obtaining and maintaining these goals. We will be discussing your progress on these goals with you throughout the program.  Initial Exercise Prescription:  Initial Exercise Prescription - 11/14/23 1000       Date of Initial Exercise RX and Referring Provider   Date 11/14/23    Referring Provider Mallipeddi, Diannah Liv MD      Oxygen   Maintain Oxygen Saturation 88% or higher      Treadmill   MPH 2.3    Grade 1    Minutes 15    METs 3.08      REL-XR   Level 3    Speed 50    Minutes 15    METs 3      Prescription Details   Frequency (times per week) 3    Duration Progress to 30 minutes of continuous aerobic without signs/symptoms of physical distress      Intensity   THRR 40-80% of Max Heartrate 112-139    Ratings of Perceived Exertion 11-13    Perceived Dyspnea 0-4      Progression   Progression Continue to progress workloads to maintain intensity without signs/symptoms of physical distress.      Resistance Training   Training Prescription Yes    Weight 5 lb    Reps 10-15          Exercise Goals: Frequency: Be able to perform aerobic exercise two to three times per week in program working toward 2-5 days per week of home exercise.  Intensity: Work with a perceived exertion of 11 (fairly light) - 15 (hard) while following your exercise prescription.  We will make changes to your prescription with you as you progress through the program.   Duration: Be able to do 30 to 45 minutes of continuous aerobic exercise in addition to a 5 minute warm-up and a 5 minute cool-down routine.   Nutrition Goals: Your personal nutrition goals will be established when you do your nutrition analysis with the  dietician.  The following are general nutrition guidelines to follow: Cholesterol < 200mg /day Sodium < 1500mg /day Fiber: Men over 50 yrs - 30 grams per day  Personal Goals:  Personal Goals and Risk Factors at Admission - 11/14/23 1013       Core Components/Risk Factors/Patient Goals on Admission    Weight Management Yes;Weight Loss;Weight Maintenance    Intervention Weight Management: Develop a combined nutrition and exercise program designed to reach desired caloric intake, while maintaining appropriate intake of nutrient and fiber, sodium and fats, and appropriate energy expenditure required for the weight goal.;Weight Management: Provide education and appropriate resources to help participant work on and attain dietary goals.;Weight Management/Obesity: Establish reasonable short term and long term weight goals.    Admit Weight 196 lb 12.8 oz (89.3 kg)    Goal Weight: Short Term 191 lb (86.6 kg)    Goal Weight: Long Term 190 lb (86.2 kg)    Expected Outcomes Short Term: Continue to assess and modify interventions until short term weight is achieved;Long Term: Adherence to nutrition and physical activity/exercise program aimed toward attainment of established weight goal;Weight Loss: Understanding of general recommendations for a balanced deficit meal plan, which promotes 1-2 lb weight loss per week and includes a negative  energy balance of [phone] kcal/d;Understanding recommendations for meals to include 15-35% energy as protein, 25-35% energy from fat, 35-60% energy from carbohydrates, less than 200mg  of dietary cholesterol, 20-35 gm of total fiber daily;Understanding of distribution of calorie intake throughout the day with the consumption of 4-5 meals/snacks;Weight Maintenance: Understanding of the daily nutrition guidelines, which includes 25-35% calories from fat, 7% or less cal from saturated fats, less than 200mg  cholesterol, less than 1.5gm of sodium, & 5 or more servings of fruits and  vegetables daily    Diabetes Yes    Intervention Provide education about signs/symptoms and action to take for hypo/hyperglycemia.;Provide education about proper nutrition, including hydration, and aerobic/resistive exercise prescription along with prescribed medications to achieve blood glucose in normal ranges: Fasting glucose 65-99 mg/dL    Expected Outcomes Short Term: Participant verbalizes understanding of the signs/symptoms and immediate care of hyper/hypoglycemia, proper foot care and importance of medication, aerobic/resistive exercise and nutrition plan for blood glucose control.;Long Term: Attainment of HbA1C < 7%.    Heart Failure Yes    Intervention Provide a combined exercise and nutrition program that is supplemented with education, support and counseling about heart failure. Directed toward relieving symptoms such as shortness of breath, decreased exercise tolerance, and extremity edema.    Expected Outcomes Improve functional capacity of life;Short term: Attendance in program 2-3 days a week with increased exercise capacity. Reported lower sodium intake. Reported increased fruit and vegetable intake. Reports medication compliance.;Short term: Daily weights obtained and reported for increase. Utilizing diuretic protocols set by physician.;Long term: Adoption of self-care skills and reduction of barriers for early signs and symptoms recognition and intervention leading to self-care maintenance.    Hypertension Yes    Intervention Provide education on lifestyle modifcations including regular physical activity/exercise, weight management, moderate sodium restriction and increased consumption of fresh fruit, vegetables, and low fat dairy, alcohol moderation, and smoking cessation.;Monitor prescription use compliance.    Expected Outcomes Short Term: Continued assessment and intervention until BP is < 140/84mm HG in hypertensive participants. < 130/37mm HG in hypertensive participants with  diabetes, heart failure or chronic kidney disease.;Long Term: Maintenance of blood pressure at goal levels.    Lipids Yes    Intervention Provide education and support for participant on nutrition & aerobic/resistive exercise along with prescribed medications to achieve LDL 70mg , HDL >40mg .    Expected Outcomes Short Term: Participant states understanding of desired cholesterol values and is compliant with medications prescribed. Participant is following exercise prescription and nutrition guidelines.;Long Term: Cholesterol controlled with medications as prescribed, with individualized exercise RX and with personalized nutrition plan. Value goals: LDL < 70mg , HDL > 40 mg.          Tobacco Use Initial Evaluation: Social History   Tobacco Use  Smoking Status Never  Smokeless Tobacco Never    Exercise Goals and Review:  Exercise Goals     Row Name 11/14/23 1012             Exercise Goals   Increase Physical Activity Yes       Intervention Provide advice, education, support and counseling about physical activity/exercise needs.;Develop an individualized exercise prescription for aerobic and resistive training based on initial evaluation findings, risk stratification, comorbidities and participant's personal goals.       Expected Outcomes Short Term: Attend rehab on a regular basis to increase amount of physical activity.;Long Term: Exercising regularly at least 3-5 days a week.;Long Term: Add in home exercise to make exercise part of routine and to  increase amount of physical activity.       Increase Strength and Stamina Yes       Intervention Provide advice, education, support and counseling about physical activity/exercise needs.;Develop an individualized exercise prescription for aerobic and resistive training based on initial evaluation findings, risk stratification, comorbidities and participant's personal goals.       Expected Outcomes Short Term: Increase workloads from initial  exercise prescription for resistance, speed, and METs.;Short Term: Perform resistance training exercises routinely during rehab and add in resistance training at home;Long Term: Improve cardiorespiratory fitness, muscular endurance and strength as measured by increased METs and functional capacity ( )       Able to understand and use rate of perceived exertion (RPE) scale Yes       Intervention Provide education and explanation on how to use RPE scale       Expected Outcomes Short Term: Able to use RPE daily in rehab to express subjective intensity level;Long Term:  Able to use RPE to guide intensity level when exercising independently       Able to understand and use Dyspnea scale Yes       Intervention Provide education and explanation on how to use Dyspnea scale       Expected Outcomes Short Term: Able to use Dyspnea scale daily in rehab to express subjective sense of shortness of breath during exertion;Long Term: Able to use Dyspnea scale to guide intensity level when exercising independently       Knowledge and understanding of Target Heart Rate Range (THRR) Yes       Intervention Provide education and explanation of THRR including how the numbers were predicted and where they are located for reference       Expected Outcomes Short Term: Able to state/look up THRR;Long Term: Able to use THRR to govern intensity when exercising independently;Short Term: Able to use daily as guideline for intensity in rehab       Able to check pulse independently Yes       Intervention Provide education and demonstration on how to check pulse in carotid and radial arteries.;Review the importance of being able to check your own pulse for safety during independent exercise       Expected Outcomes Short Term: Able to explain why pulse checking is important during independent exercise;Long Term: Able to check pulse independently and accurately       Understanding of Exercise Prescription Yes       Intervention  Provide education, explanation, and written materials on patient's individual exercise prescription       Expected Outcomes Short Term: Able to explain program exercise prescription;Long Term: Able to explain home exercise prescription to exercise independently        Copy of goals given to participant.

## 2023-11-15 ENCOUNTER — Encounter (HOSPITAL_COMMUNITY): Payer: Self-pay | Admitting: *Deleted

## 2023-11-15 ENCOUNTER — Encounter (HOSPITAL_COMMUNITY)
Admission: RE | Admit: 2023-11-15 | Discharge: 2023-11-15 | Disposition: A | Discharge status: Home / Self Care | Source: Ambulatory Visit | Attending: Internal Medicine | Admitting: Internal Medicine

## 2023-11-15 DIAGNOSIS — Z955 Presence of coronary angioplasty implant and graft: Secondary | ICD-10-CM

## 2023-11-15 LAB — GLUCOSE, CAPILLARY
Glucose-Capillary: 80 mg/dL (ref 70–99)
Glucose-Capillary: 107 mg/dL — ABNORMAL HIGH (ref 70–99)

## 2023-11-15 NOTE — Progress Notes (Signed)
 Daily Session Note  Patient Details  Name: Thomas Summers MRN: [id number] Date of Birth: 1956/03/18 Referring Provider:   Flowsheet Row CARDIAC REHAB PHASE II ORIENTATION from 11/14/2023 in [facility]  Referring Provider Stacia Diannah Liv MD    Encounter Date: 11/15/2023  Check In:  Session Check In - 11/15/23 1037       Check-In   Supervising physician immediately available to respond to emergencies See telemetry face sheet for immediately available MD    Location [facility]    Staff Present Powell Benders, BS, Exercise Physiologist;Victoria Zina, RN;Jessica [place], MA, RCEP, CCRP, CCET    Virtual Visit No    Medication changes reported     No    Fall or balance concerns reported    No    Tobacco Cessation No Change    Warm-up and Cool-down Performed on first and last piece of equipment    Resistance Training Performed Yes    VAD Patient? No    PAD/SET Patient? No      Pain Assessment   Currently in Pain? No/denies    Multiple Pain Sites No          Capillary Blood Glucose: No results found for this or any previous visit (from the past 24 hours).    Social History   Tobacco Use  Smoking Status Never  Smokeless Tobacco Never    Goals Met:  Independence with exercise equipment Exercise tolerated well No report of concerns or symptoms today Strength training completed today  Goals Unmet:  Not Applicable  Comments: First full day of exercise!  Patient was oriented to gym and equipment including functions, settings, policies, and procedures.  Patient's individual exercise prescription and treatment plan were reviewed.  All starting workloads were established based on the results of the 6 minute walk test done at initial orientation visit.  The plan for exercise progression was also introduced and progression will be customized based on patient's performance and goals.

## 2023-11-15 NOTE — Progress Notes (Signed)
 Cardiac Individual Treatment Plan  Patient Details  Name: Thomas Summers MRN: [id number] Date of Birth: [birth year] Referring Provider:   Flowsheet Row CARDIAC REHAB PHASE II ORIENTATION from 11/14/2023 in [facility] CARDIAC REHABILITATION  Referring Provider Stacia Diannah Liv MD    Initial Encounter Date:  Flowsheet Row CARDIAC REHAB PHASE II ORIENTATION from 11/14/2023 in [place] CARDIAC REHABILITATION  Date 11/14/23    Visit Diagnosis: Status post coronary artery stent placement  Patient's Home Medications on Admission:  Current Outpatient Medications:    amiodarone  (PACERONE ) 200 MG tablet, Take 1 tablet (200 mg total) by mouth daily. For the next 2 weeks, take 400 mg twice daily.  Go back to 200 mg twice daily after that., Disp: 30 tablet, Rfl: 5   aspirin  EC 81 MG tablet, Take 1 tablet (81 mg total) by mouth daily. Swallow whole., Disp: 30 tablet, Rfl: 5   atorvastatin  (LIPITOR) 80 MG tablet, Take 1 tablet (80 mg total) by mouth at bedtime., Disp: 90 tablet, Rfl: 3   blood glucose meter kit and supplies KIT, Dispense based on patient and insurance preference. ( One touch ) diabetes type 2. E11.9. Test blood sugar once a day, Disp: 1 each, Rfl: 5   clopidogrel  (PLAVIX ) 75 MG tablet, Take 1 tablet (75 mg total) by mouth daily with breakfast., Disp: 90 tablet, Rfl: 3   fluticasone  (FLONASE ) 50 MCG/ACT nasal spray, Place 1 spray into both nostrils daily as needed for allergies or rhinitis., Disp: , Rfl:    glucose blood test strip, Use 1x per day as directed., Disp: 100 each, Rfl: 3   hydrOXYzine  (ATARAX ) 25 MG tablet, Take one every 12 hours for anxiety or  sleep, Disp: 20 tablet, Rfl: 1   losartan  (COZAAR ) 25 MG tablet, Take 1 tablet (25 mg total) by mouth 2 (two) times daily., Disp: 60 tablet, Rfl: 3   metFORMIN (GLUCOPHAGE) 500 MG tablet, Take 500 mg by mouth daily., Disp: , Rfl:    nitroGLYCERIN  (NITROSTAT ) 0.4 MG SL tablet, Place 1 tablet (0.4 mg total) under the  tongue every 5 (five) minutes as needed for chest pain., Disp: 25 tablet, Rfl: 3   OneTouch Delica Lancets 33G MISC, USE ONE  TO CHECK GLUCOSE ONCE DAILY, Disp: 100 each, Rfl: 1   silodosin  (RAPAFLO ) 8 MG CAPS capsule, TAKE 1 CAPSULE BY MOUTH ONCE DAILY WITH BREAKFAST, Disp: 30 capsule, Rfl: 0  Past Medical History: Past Medical History:  Diagnosis Date   Allergy    Depression    Diabetes mellitus without complication (HCC)    Heart murmur    Hyperlipidemia    Hypertension     Tobacco Use: Social History   Tobacco Use  Smoking Status Never  Smokeless Tobacco Never    Labs: Review Flowsheet  More data exists      Latest Ref Rng & Units 05/08/2019 12/18/2019 03/02/2020 11/10/2020 10/12/2023  Labs for ITP Cardiac and Pulmonary Rehab  Cholestrol 0 - 200 mg/dL 801  871  - 850  890   LDL (calc) 0 - 99 mg/dL 873  60  - 71  47   HDL-C >40 mg/dL 54  53  - 63  49   Trlycerides <150 mg/dL 898  78  - 77  66   Hemoglobin A1c 4.8 - 5.6 % 6.6  6.5  5.7  5.9  5.6     Capillary Blood Glucose: Lab Results  Component Value Date   GLUCAP 92 10/21/2023   GLUCAP 95 10/16/2023  GLUCAP 88 10/12/2023   GLUCAP 98 10/11/2023   GLUCAP 101 (H) 08/24/2023     Exercise Target Goals: Exercise Program Goal: Individual exercise prescription set using results from initial 6 min walk test and THRR while considering  patient's activity barriers and safety.   Exercise Prescription Goal: Starting with aerobic activity 30 plus minutes a day, 3 days per week for initial exercise prescription. Provide home exercise prescription and guidelines that participant acknowledges understanding prior to discharge.  Activity Barriers & Risk Stratification:  Activity Barriers & Cardiac Risk Stratification - 11/14/23 1008       Activity Barriers & Cardiac Risk Stratification   Activity Barriers Balance Concerns;Deconditioning;Muscular Weakness;Decreased Ventricular Function;Other (comment)    Comments  dizziness/lightheaded as blood pressure increases    Cardiac Risk Stratification Moderate          6 Minute Walk:  6 Minute Walk     Row Name 11/14/23 0959         6 Minute Walk   Phase Initial     Distance 1245 feet     Walk Time 6 minutes     # of Rest Breaks 0     MPH 2.36     METS 3.49     RPE 13     VO2 Peak 12.23     Symptoms Yes (comment)     Comments dizzy/lightheaded from increased blood pressure, relieved as pressure returned to normal     Resting HR 86 bpm     Resting BP 144/62     Resting Oxygen Saturation  98 %     Exercise Oxygen Saturation  during 6 min walk 98 %     Max Ex. HR 102 bpm     Max Ex. BP 170/74     2 Minute Post BP 138/60        Oxygen Initial Assessment:   Oxygen Re-Evaluation:   Oxygen Discharge (Final Oxygen Re-Evaluation):   Initial Exercise Prescription:  Initial Exercise Prescription - 11/14/23 1000       Date of Initial Exercise RX and Referring Provider   Date 11/14/23    Referring Provider Mallipeddi, Diannah Liv MD      Oxygen   Maintain Oxygen Saturation 88% or higher      Treadmill   MPH 2.3    Grade 1    Minutes 15    METs 3.08      REL-XR   Level 3    Speed 50    Minutes 15    METs 3      Prescription Details   Frequency (times per week) 3    Duration Progress to 30 minutes of continuous aerobic without signs/symptoms of physical distress      Intensity   THRR 40-80% of Max Heartrate 112-139    Ratings of Perceived Exertion 11-13    Perceived Dyspnea 0-4      Progression   Progression Continue to progress workloads to maintain intensity without signs/symptoms of physical distress.      Resistance Training   Training Prescription Yes    Weight 5 lb    Reps 10-15          Perform Capillary Blood Glucose checks as needed.  Exercise Prescription Changes:   Exercise Prescription Changes     Row Name 11/14/23 1000             Response to Exercise   Blood Pressure (Admit) 144/62        Blood  Pressure (Exercise) 170/74       Blood Pressure (Exit) 138/60       Heart Rate (Admit) 86 bpm       Heart Rate (Exercise) 102 bpm       Heart Rate (Exit) 102 bpm       Oxygen Saturation (Admit) 98 %       Oxygen Saturation (Exercise) 98 %       Rating of Perceived Exertion (Exercise) 13       Symptoms dizzy/lightheaded from high BP       Comments walk test results          Exercise Comments:   Exercise Goals and Review:   Exercise Goals     Row Name 11/14/23 1012             Exercise Goals   Increase Physical Activity Yes       Intervention Provide advice, education, support and counseling about physical activity/exercise needs.;Develop an individualized exercise prescription for aerobic and resistive training based on initial evaluation findings, risk stratification, comorbidities and participant's personal goals.       Expected Outcomes Short Term: Attend rehab on a regular basis to increase amount of physical activity.;Long Term: Exercising regularly at least 3-5 days a week.;Long Term: Add in home exercise to make exercise part of routine and to increase amount of physical activity.       Increase Strength and Stamina Yes       Intervention Provide advice, education, support and counseling about physical activity/exercise needs.;Develop an individualized exercise prescription for aerobic and resistive training based on initial evaluation findings, risk stratification, comorbidities and participant's personal goals.       Expected Outcomes Short Term: Increase workloads from initial exercise prescription for resistance, speed, and METs.;Short Term: Perform resistance training exercises routinely during rehab and add in resistance training at home;Long Term: Improve cardiorespiratory fitness, muscular endurance and strength as measured by increased METs and functional capacity ( )       Able to understand and use rate of perceived exertion (RPE) scale Yes        Intervention Provide education and explanation on how to use RPE scale       Expected Outcomes Short Term: Able to use RPE daily in rehab to express subjective intensity level;Long Term:  Able to use RPE to guide intensity level when exercising independently       Able to understand and use Dyspnea scale Yes       Intervention Provide education and explanation on how to use Dyspnea scale       Expected Outcomes Short Term: Able to use Dyspnea scale daily in rehab to express subjective sense of shortness of breath during exertion;Long Term: Able to use Dyspnea scale to guide intensity level when exercising independently       Knowledge and understanding of Target Heart Rate Range (THRR) Yes       Intervention Provide education and explanation of THRR including how the numbers were predicted and where they are located for reference       Expected Outcomes Short Term: Able to state/look up THRR;Long Term: Able to use THRR to govern intensity when exercising independently;Short Term: Able to use daily as guideline for intensity in rehab       Able to check pulse independently Yes       Intervention Provide education and demonstration on how to check pulse in carotid and radial arteries.;Review the importance of being able to  check your own pulse for safety during independent exercise       Expected Outcomes Short Term: Able to explain why pulse checking is important during independent exercise;Long Term: Able to check pulse independently and accurately       Understanding of Exercise Prescription Yes       Intervention Provide education, explanation, and written materials on patient's individual exercise prescription       Expected Outcomes Short Term: Able to explain program exercise prescription;Long Term: Able to explain home exercise prescription to exercise independently          Exercise Goals Re-Evaluation :    Discharge Exercise Prescription (Final Exercise Prescription Changes):  Exercise  Prescription Changes - 11/14/23 1000       Response to Exercise   Blood Pressure (Admit) 144/62    Blood Pressure (Exercise) 170/74    Blood Pressure (Exit) 138/60    Heart Rate (Admit) 86 bpm    Heart Rate (Exercise) 102 bpm    Heart Rate (Exit) 102 bpm    Oxygen Saturation (Admit) 98 %    Oxygen Saturation (Exercise) 98 %    Rating of Perceived Exertion (Exercise) 13    Symptoms dizzy/lightheaded from high BP    Comments walk test results          Nutrition:  Target Goals: Understanding of nutrition guidelines, daily intake of sodium 1500mg , cholesterol 200mg , calories 30% from fat and 7% or less from saturated fats, daily to have 5 or more servings of fruits and vegetables.  Biometrics:  Pre Biometrics - 11/14/23 1012       Pre Biometrics   Height 6' 2 (1.88 m)    Weight 196 lb 12.8 oz (89.3 kg)    Waist Circumference 42 inches    Hip Circumference 36 inches    Waist to Hip Ratio 1.17 %    BMI (Calculated) 25.26    Grip Strength 27.4 kg    Single Leg Stand 7.2 seconds           Nutrition Therapy Plan and Nutrition Goals:  Nutrition Therapy & Goals - 11/14/23 1013       Intervention Plan   Intervention Prescribe, educate and counsel regarding individualized specific dietary modifications aiming towards targeted core components such as weight, hypertension, lipid management, diabetes, heart failure and other comorbidities.;Nutrition handout(s) given to patient.    Expected Outcomes Short Term Goal: Understand basic principles of dietary content, such as calories, fat, sodium, cholesterol and nutrients.;Long Term Goal: Adherence to prescribed nutrition plan.          Nutrition Assessments:  MEDIFICTS Score Key: >=70 Need to make dietary changes  40-70 Heart Healthy Diet <= 40 Therapeutic Level Cholesterol Diet  Flowsheet Row CARDIAC VIRTUAL BASED CARE from 11/10/2023 in [facility] CARDIAC REHABILITATION  Picture Your Plate Total Score on Admission 85    Picture Your Plate Scores: <59 Unhealthy dietary pattern with much room for improvement. 41-50 Dietary pattern unlikely to meet recommendations for good health and room for improvement. 51-60 More healthful dietary pattern, with some room for improvement.  >60 Healthy dietary pattern, although there may be some specific behaviors that could be improved.    Nutrition Goals Re-Evaluation:   Nutrition Goals Discharge (Final Nutrition Goals Re-Evaluation):   Psychosocial: Target Goals: Acknowledge presence or absence of significant depression and/or stress, maximize coping skills, provide positive support system. Participant is able to verbalize types and ability to use techniques and skills needed for reducing stress and depression.  Initial Review & Psychosocial Screening:  Initial Psych Review & Screening - 11/10/23 0900       Initial Review   Current issues with Current Sleep Concerns;Current Anxiety/Panic      Family Dynamics   Good Support System? Yes      Barriers   Psychosocial barriers to participate in program The patient should benefit from training in stress management and relaxation.;There are no identifiable barriers or psychosocial needs.      Screening Interventions   Interventions Encouraged to exercise;To provide support and resources with identified psychosocial needs;Provide feedback about the scores to participant    Expected Outcomes Short Term goal: Utilizing psychosocial counselor, staff and physician to assist with identification of specific Stressors or current issues interfering with healing process. Setting desired goal for each stressor or current issue identified.;Long Term Goal: Stressors or current issues are controlled or eliminated.;Short Term goal: Identification and review with participant of any Quality of Life or Depression concerns found by scoring the questionnaire.;Long Term goal: The participant improves quality of Life and PHQ9 Scores as seen  by post scores and/or verbalization of changes          Quality of Life Scores:  Quality of Life - 11/10/23 1114       Quality of Life   Select Quality of Life      Quality of Life Scores   Health/Function Pre 21.43 %    Socioeconomic Pre 21.69 %    Psych/Spiritual Pre 20.64 %    Family Pre 24.7 %    GLOBAL Pre 21.18 %         Scores of 19 and below usually indicate a poorer quality of life in these areas.  A difference of  2-3 points is a clinically meaningful difference.  A difference of 2-3 points in the total score of the Quality of Life Index has been associated with significant improvement in overall quality of life, self-image, physical symptoms, and general health in studies assessing change in quality of life.  PHQ-9: Review Flowsheet  More data may exist      11/14/2023 06/01/2020 03/13/2020 04/12/2017 07/12/2016  Depression screen PHQ 2/9  Decreased Interest 0 0 0 0 0  Down, Depressed, Hopeless 0 0 0 0 0  PHQ - 2 Score 0 0 0 0 0  Altered sleeping 0 - - - -  Tired, decreased energy 3 - - - -  Change in appetite 0 - - - -  Feeling bad or failure about yourself  0 - - - -  Trouble concentrating 0 - - - -  Moving slowly or fidgety/restless 1 - - - -  Suicidal thoughts 0 - - - -  PHQ-9 Score 4 - - - -  Difficult doing work/chores Not difficult at all - - - -   Interpretation of Total Score  Total Score Depression Severity:  1-4 = Minimal depression, 5-9 = Mild depression, 10-14 = Moderate depression, 15-19 = Moderately severe depression, 20-27 = Severe depression   Psychosocial Evaluation and Intervention:  Psychosocial Evaluation - 11/10/23 0900       Psychosocial Evaluation & Interventions   Interventions Stress management education;Relaxation education;Encouraged to exercise with the program and follow exercise prescription    Comments Patient was referred to [facility] with stent placement. He denies any depression but has had some anxiety since his stent due to  chronic dizziness and elevated B/P. He has been treated in the ED for this since his stent. 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 Dr. Mallipeddi adjusted his medications 8/6 and put him on a 10 day monitor. He says his dizziness has increased since the med change and plans to call her office today. He was also started on Hydroxyzine  for the anxiety but he says he has not used yet. He says he does not sleep well having trouble staying asleep. He is waiting on a sleep study. He lives with his wife who is his main support along with his chruch family and friends. His goals for the program are to increase his strength and continue to lose weight. He started a weight loss diet in March and has lost 25 lbs. He has no barriers identified to complete the program but is concerned about being able to exercise due to the dizziness.    Expected Outcomes Short Term: Patient will start the program and attend consistently. Long Term: Patient will complete the program meeting personal goals.    Continue Psychosocial Services  Follow up required by staff          Psychosocial Re-Evaluation:   Psychosocial Discharge (Final Psychosocial Re-Evaluation):   Vocational Rehabilitation: Provide vocational rehab assistance to qualifying candidates.   Vocational Rehab Evaluation & Intervention:  Vocational Rehab - 11/10/23 0859       Initial Vocational Rehab Evaluation & Intervention   Assessment shows need for Vocational Rehabilitation No      Vocational Rehab Re-Evaulation   Comments Patient is retired.          Education: Education Goals: Education classes will be provided on a weekly basis, covering required topics. Participant will state understanding/return demonstration of topics presented.  Learning Barriers/Preferences:  Learning Barriers/Preferences - 11/14/23 0846       Learning Barriers/Preferences   Learning Barriers Hearing    Learning Preferences Written Material           Education Topics: Hypertension, Hypertension Reduction -Define heart disease and high blood pressure. Discus how high blood pressure affects the body and ways to reduce high blood pressure.   Exercise and Your Heart -Discuss why it is important to exercise, the FITT principles of exercise, normal and abnormal responses to exercise, and how to exercise safely.   Angina -Discuss definition of angina, causes of angina, treatment of angina, and how to decrease risk of having angina.   Cardiac Medications -Review what the following cardiac medications are used for, how they affect the body, and side effects that may occur when taking the medications.  Medications include Aspirin , Beta blockers, calcium  channel blockers, ACE Inhibitors, angiotensin receptor blockers, diuretics, digoxin, and antihyperlipidemics.   Congestive Heart Failure -Discuss the definition of CHF, how to live with CHF, the signs and symptoms of CHF, and how keep track of weight and sodium intake.   Heart Disease and Intimacy -Discus the effect sexual activity has on the heart, how changes occur during intimacy as we age, and safety during sexual activity.   Smoking Cessation / COPD -Discuss different methods to quit smoking, the health benefits of quitting smoking, and the definition of COPD.   Nutrition I: Fats -Discuss the types of cholesterol, what cholesterol does to the heart, and how cholesterol levels can be controlled.   Nutrition II: Labels -Discuss the different components of food labels and how to read food label   Heart Parts/Heart Disease and PAD -Discuss the anatomy of the heart, the pathway of blood circulation through the heart, and these are affected by heart disease.   Stress  I: Signs and Symptoms -Discuss the causes of stress, how stress may lead to anxiety and depression, and ways to limit stress.   Stress II: Relaxation -Discuss different types of relaxation techniques to limit  stress.   Warning Signs of Stroke / TIA -Discuss definition of a stroke, what the signs and symptoms are of a stroke, and how to identify when someone is having stroke.   Knowledge Questionnaire Score:  Knowledge Questionnaire Score - 11/10/23 1114       Knowledge Questionnaire Score   Pre Score 26/26          Core Components/Risk Factors/Patient Goals at Admission:  Personal Goals and Risk Factors at Admission - 11/14/23 1013       Core Components/Risk Factors/Patient Goals on Admission    Weight Management Yes;Weight Loss;Weight Maintenance    Intervention Weight Management: Develop a combined nutrition and exercise program designed to reach desired caloric intake, while maintaining appropriate intake of nutrient and fiber, sodium and fats, and appropriate energy expenditure required for the weight goal.;Weight Management: Provide education and appropriate resources to help participant work on and attain dietary goals.;Weight Management/Obesity: Establish reasonable short term and long term weight goals.    Admit Weight 196 lb 12.8 oz (89.3 kg)    Goal Weight: Short Term 191 lb (86.6 kg)    Goal Weight: Long Term 190 lb (86.2 kg)    Expected Outcomes Short Term: Continue to assess and modify interventions until short term weight is achieved;Long Term: Adherence to nutrition and physical activity/exercise program aimed toward attainment of established weight goal;Weight Loss: Understanding of general recommendations for a balanced deficit meal plan, which promotes 1-2 lb weight loss per week and includes a negative energy balance of [phone] kcal/d;Understanding recommendations for meals to include 15-35% energy as protein, 25-35% energy from fat, 35-60% energy from carbohydrates, less than 200mg  of dietary cholesterol, 20-35 gm of total fiber daily;Understanding of distribution of calorie intake throughout the day with the consumption of 4-5 meals/snacks;Weight Maintenance:  Understanding of the daily nutrition guidelines, which includes 25-35% calories from fat, 7% or less cal from saturated fats, less than 200mg  cholesterol, less than 1.5gm of sodium, & 5 or more servings of fruits and vegetables daily    Diabetes Yes    Intervention Provide education about signs/symptoms and action to take for hypo/hyperglycemia.;Provide education about proper nutrition, including hydration, and aerobic/resistive exercise prescription along with prescribed medications to achieve blood glucose in normal ranges: Fasting glucose 65-99 mg/dL    Expected Outcomes Short Term: Participant verbalizes understanding of the signs/symptoms and immediate care of hyper/hypoglycemia, proper foot care and importance of medication, aerobic/resistive exercise and nutrition plan for blood glucose control.;Long Term: Attainment of HbA1C < 7%.    Heart Failure Yes    Intervention Provide a combined exercise and nutrition program that is supplemented with education, support and counseling about heart failure. Directed toward relieving symptoms such as shortness of breath, decreased exercise tolerance, and extremity edema.    Expected Outcomes Improve functional capacity of life;Short term: Attendance in program 2-3 days a week with increased exercise capacity. Reported lower sodium intake. Reported increased fruit and vegetable intake. Reports medication compliance.;Short term: Daily weights obtained and reported for increase. Utilizing diuretic protocols set by physician.;Long term: Adoption of self-care skills and reduction of barriers for early signs and symptoms recognition and intervention leading to self-care maintenance.    Hypertension Yes    Intervention Provide education on lifestyle modifcations including regular physical activity/exercise, weight management, moderate  sodium restriction and increased consumption of fresh fruit, vegetables, and low fat dairy, alcohol moderation, and smoking  cessation.;Monitor prescription use compliance.    Expected Outcomes Short Term: Continued assessment and intervention until BP is < 140/78mm HG in hypertensive participants. < 130/91mm HG in hypertensive participants with diabetes, heart failure or chronic kidney disease.;Long Term: Maintenance of blood pressure at goal levels.    Lipids Yes    Intervention Provide education and support for participant on nutrition & aerobic/resistive exercise along with prescribed medications to achieve LDL 70mg , HDL >40mg .    Expected Outcomes Short Term: Participant states understanding of desired cholesterol values and is compliant with medications prescribed. Participant is following exercise prescription and nutrition guidelines.;Long Term: Cholesterol controlled with medications as prescribed, with individualized exercise RX and with personalized nutrition plan. Value goals: LDL < 70mg , HDL > 40 mg.          Core Components/Risk Factors/Patient Goals Review:    Core Components/Risk Factors/Patient Goals at Discharge (Final Review):    ITP Comments:  ITP Comments     Row Name 11/10/23 0948 11/14/23 0959 11/15/23 1013       ITP Comments Virtual orientation visit completed for cardiac rehab with stent placement. On-site orientation visit scheduled for 11/14/23 at 0830. Patient attend orientation today.  Patient is attending Cardiac Rehabilitation Program.  Documentation for diagnosis can be found in [facility] 10/11/23.  Reviewed medical chart, RPE/RPD, gym safety, and program guidelines.  Patient was fitted to equipment they will be using during rehab.  Patient is scheduled to start exercise on Wednesday 11/15/23 at 1100.   Initial ITP created and sent for review and signature by Dr. Dorn Ross, Medical Director for Cardiac Rehabilitation Program. 30 day review completed. ITP sent to Dr. Dorn Ross, Medical Director of Cardiac Rehab. Continue with ITP unless changes are made by physician.  Only oriented  yesterday.        Comments: 30 day review

## 2023-11-17 ENCOUNTER — Telehealth: Payer: Self-pay

## 2023-11-17 ENCOUNTER — Telehealth: Payer: Self-pay | Admitting: Internal Medicine

## 2023-11-17 ENCOUNTER — Encounter (HOSPITAL_COMMUNITY)
Admission: RE | Admit: 2023-11-17 | Discharge: 2023-11-17 | Disposition: A | Discharge status: Home / Self Care | Source: Ambulatory Visit | Attending: Internal Medicine | Admitting: Internal Medicine

## 2023-11-17 DIAGNOSIS — Z955 Presence of coronary angioplasty implant and graft: Secondary | ICD-10-CM

## 2023-11-17 LAB — GLUCOSE, CAPILLARY: Glucose-Capillary: 100 mg/dL — ABNORMAL HIGH (ref 70–99)

## 2023-11-17 NOTE — Telephone Encounter (Signed)
 Pt came into office w/someone from Cardiac rehab. The patient is feeling woozy in his head and eyes and feeling shakey. He would like to be seen or talk to a nurse.

## 2023-11-17 NOTE — Telephone Encounter (Signed)
**Note De-Identified Lynniah Janoski Obfuscation** Per the [facility] provider portal:  I have notified the pt of this outcome and I provided him with the Sleep Lab's phone number so he can call them to schedule his Split Night Sleep Study. He and his wife both verbalized understanding and thanked me for my call.

## 2023-11-17 NOTE — Telephone Encounter (Signed)
 Pt notified to stop Amiodarone  and to come for EKG in one week.

## 2023-11-17 NOTE — Telephone Encounter (Signed)
 Started feeling bad yesterday morning after getting up. Patient complains of fuzzy, not clear, and shakey.  He walks with no problem. Was brought over from cardiac rehab. BP 140/73. Felt good Wednesday.  Did cardiac rehab and everything went well. 5-wks post stent.  In [facility]: 1BP: 42/78, Pulse: 99% and 83. Performed by LuKisha Pinnix.

## 2023-11-17 NOTE — Progress Notes (Signed)
 Incomplete Session Note  Patient Details  Name: Thomas Summers MRN: [id number] Date of Birth: 08-Apr-1955 Referring Provider:   Flowsheet Row CARDIAC REHAB PHASE II ORIENTATION from 11/14/2023 in [facility] CARDIAC REHABILITATION  Referring Provider [place], Diannah Liv MD    Vaughn JONELLE Level did not complete his rehab session.  Patient arrived stating he was not feeling well starting yesterday. He says he feels off in his head. He denies any chest pain or SOB, dizziness, headache, or nasal congestion. He says he has had these feelings off and on since his stent. He says Dr. Mallipeddi is aware and she has been adjusting his medications. He said he was not up to exercising. CBG was 100. VS WNL. He was advised to see urgent care or the ED if symptoms persist or worsen. He verbalized understanding.

## 2023-11-17 NOTE — Telephone Encounter (Addendum)
 Thomas Summers in cardiac rehab notified that Dr. Mallipeddi would like to have modified cardiac rehab. Pt notified and voiced understanding.

## 2023-11-20 ENCOUNTER — Encounter (HOSPITAL_COMMUNITY)
Admission: RE | Admit: 2023-11-20 | Discharge: 2023-11-20 | Discharge status: Home / Self Care | Source: Ambulatory Visit | Attending: Internal Medicine

## 2023-11-20 DIAGNOSIS — Z955 Presence of coronary angioplasty implant and graft: Secondary | ICD-10-CM | POA: Diagnosis not present

## 2023-11-20 NOTE — Progress Notes (Signed)
 Daily Session Note  Patient Details  Name: Thomas Summers MRN: [id number] Date of Birth: [birth year] Referring Provider:   Flowsheet Row CARDIAC REHAB PHASE II ORIENTATION from 11/14/2023 in [facility] CARDIAC REHABILITATION  Referring Provider Stacia Diannah Liv MD    Encounter Date: 11/20/2023  Check In:  Session Check In - 11/20/23 1100       Check-In   Supervising physician immediately available to respond to emergencies See telemetry face sheet for immediately available MD    Location [facility]    Staff Present Powell Benders, BS, Exercise Physiologist;Jessica Vonzell, MA, RCEP, CCRP, CCET;Brittany Jackquline, BSN, RN, WTA-C    Virtual Visit No    Medication changes reported     Yes    Comments already updated    Fall or balance concerns reported    No    Tobacco Cessation No Change    Warm-up and Cool-down Performed on first and last piece of equipment    Resistance Training Performed Yes    VAD Patient? No    PAD/SET Patient? No      Pain Assessment   Currently in Pain? No/denies    Multiple Pain Sites No          Capillary Blood Glucose: No results found for this or any previous visit (from the past 24 hours).    Social History   Tobacco Use  Smoking Status Never  Smokeless Tobacco Never    Goals Met:  Independence with exercise equipment Exercise tolerated well No report of concerns or symptoms today Strength training completed today  Goals Unmet:  Not Applicable  Comments: Pt able to follow exercise prescription today without complaint.  Will continue to monitor for progression.

## 2023-11-22 ENCOUNTER — Encounter (HOSPITAL_COMMUNITY)
Admission: RE | Admit: 2023-11-22 | Discharge: 2023-11-22 | Disposition: A | Discharge status: Home / Self Care | Source: Ambulatory Visit | Attending: Internal Medicine | Admitting: Internal Medicine

## 2023-11-22 DIAGNOSIS — Z955 Presence of coronary angioplasty implant and graft: Secondary | ICD-10-CM | POA: Diagnosis not present

## 2023-11-22 LAB — GLUCOSE, CAPILLARY: Glucose-Capillary: 113 mg/dL — ABNORMAL HIGH (ref 70–99)

## 2023-11-22 NOTE — Progress Notes (Signed)
 Daily Session Note  Patient Details  Name: TALIS IWAN MRN: [id number] Date of Birth: 04/12/1955 Referring Provider:   Flowsheet Row CARDIAC REHAB PHASE II ORIENTATION from 11/14/2023 in [facility] CARDIAC REHABILITATION  Referring Provider Stacia Diannah Liv MD    Encounter Date: 11/22/2023  Check In:  Session Check In - 11/22/23 1058       Check-In   Supervising physician immediately available to respond to emergencies See telemetry face sheet for immediately available MD    Location [facility]    Staff Present Danney Daring, BS, RRT, CPFT;Jessica [place], MA, RCEP, CCRP, Sueellen Louder, RN, BSN    Virtual Visit No    Medication changes reported     No    Fall or balance concerns reported    No    Tobacco Cessation No Change    Warm-up and Cool-down Performed on first and last piece of equipment    Resistance Training Performed Yes    VAD Patient? No    PAD/SET Patient? No      Pain Assessment   Currently in Pain? No/denies          Capillary Blood Glucose: Results for orders placed or performed during the hospital encounter of 11/22/23 (from the past 24 hours)  Glucose, capillary     Status: Abnormal   Collection Time: 11/22/23 10:43 AM  Result Value Ref Range   Glucose-Capillary 113 (H) 70 - 99 mg/dL      Social History   Tobacco Use  Smoking Status Never  Smokeless Tobacco Never    Goals Met:  Independence with exercise equipment Exercise tolerated well No report of concerns or symptoms today Strength training completed today  Goals Unmet:  Not Applicable  Comments: Pt able to follow exercise prescription today without complaint.  Will continue to monitor for progression.

## 2023-11-24 ENCOUNTER — Encounter (HOSPITAL_COMMUNITY)
Admission: RE | Admit: 2023-11-24 | Discharge: 2023-11-24 | Disposition: A | Discharge status: Home / Self Care | Source: Ambulatory Visit | Attending: Internal Medicine | Admitting: Internal Medicine

## 2023-11-24 ENCOUNTER — Ambulatory Visit: Attending: Cardiology

## 2023-11-24 DIAGNOSIS — I10 Essential (primary) hypertension: Secondary | ICD-10-CM

## 2023-11-24 DIAGNOSIS — Z955 Presence of coronary angioplasty implant and graft: Secondary | ICD-10-CM

## 2023-11-24 NOTE — Progress Notes (Signed)
 Patient here for EKG 1 week after stopping amiodarone  per VM  Patient states he is still very fatigue, weak and shaky he states his not driving because he does not fell very good at all. Patient denies CP, dizziness and SOB  Otherwise patient presents well \\advised  patient if symptoms worsen before hearing back or over the weekend to proceed to the ED  Allowed patient to leave and routed to provider for review

## 2023-11-24 NOTE — Progress Notes (Signed)
 Daily Session Note  Patient Details  Name: BRAXTIN BAMBA MRN: [id number] Date of Birth: 29-Sep-1955 Referring Provider:   Flowsheet Row CARDIAC REHAB PHASE II ORIENTATION from 11/14/2023 in [facility] CARDIAC REHABILITATION  Referring Provider Stacia Diannah Liv MD    Encounter Date: 11/24/2023  Check In:  Session Check In - 11/24/23 1105       Check-In   Supervising physician immediately available to respond to emergencies See telemetry face sheet for immediately available MD    Location [facility]    Staff Present Powell Benders, BS, Exercise Physiologist;Jessica Vonzell, MA, RCEP, CCRP, CCET    Virtual Visit No    Medication changes reported     No    Fall or balance concerns reported    No    Tobacco Cessation No Change    Warm-up and Cool-down Performed on first and last piece of equipment    Resistance Training Performed Yes    VAD Patient? No    PAD/SET Patient? No      Pain Assessment   Currently in Pain? No/denies          Capillary Blood Glucose: No results found for this or any previous visit (from the past 24 hours).    Social History   Tobacco Use  Smoking Status Never  Smokeless Tobacco Never    Goals Met:  Independence with exercise equipment Exercise tolerated well No report of concerns or symptoms today Strength training completed today  Goals Unmet:  Not Applicable  Comments: Pt able to follow exercise prescription today without complaint.  Will continue to monitor for progression.

## 2023-11-27 ENCOUNTER — Encounter (HOSPITAL_COMMUNITY)

## 2023-11-28 ENCOUNTER — Telehealth: Payer: Self-pay | Admitting: Internal Medicine

## 2023-11-28 DIAGNOSIS — R42 Dizziness and giddiness: Secondary | ICD-10-CM

## 2023-11-28 NOTE — Telephone Encounter (Signed)
 Pt's spouse is requesting a callback regarding pt not having any energy and stated he feels the same way he did before having the procedure done on 10/11/2023 if not worse. Please advise.

## 2023-11-29 ENCOUNTER — Telehealth: Payer: Self-pay | Admitting: Internal Medicine

## 2023-11-29 ENCOUNTER — Encounter: Payer: Self-pay | Admitting: Internal Medicine

## 2023-11-29 ENCOUNTER — Encounter (HOSPITAL_COMMUNITY)

## 2023-11-29 DIAGNOSIS — R42 Dizziness and giddiness: Secondary | ICD-10-CM

## 2023-11-29 NOTE — Addendum Note (Signed)
 Addended by: Maylen Waltermire M on: 11/29/2023 03:01 PM   Modules accepted: Orders

## 2023-11-29 NOTE — Telephone Encounter (Signed)
 Checking percert on the following patient for testing scheduled at [facility].    CT HEAD WO CONTRAST   12/13/2023

## 2023-11-29 NOTE — Telephone Encounter (Signed)
 Patient informed and verbalized understanding of plan. Agrees to CT.

## 2023-11-29 NOTE — Telephone Encounter (Signed)
 Spoke with patient who says he was advised that Dr. Mallipeddi was awaiting heart monitor result to determine treatment for PVC's. Reports he confirmed that his heart monitor was received by [employer] and advised it was received on yesterday. 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 Reports dizziness did not improve after stopping amiodarone . Reports he did an EKG this past Friday at our office that showed PVC's. Reports his symptoms have not worsened. Advised that message would be sent to provider. ER precautions advised for worsening symptoms. Verbalized understanding.

## 2023-11-29 NOTE — Telephone Encounter (Signed)
 Patient is requesting a referral to [facility] ENT regarding his dizziness  Fax #[phone]

## 2023-12-01 ENCOUNTER — Telehealth: Payer: Self-pay | Admitting: Internal Medicine

## 2023-12-01 ENCOUNTER — Ambulatory Visit: Payer: Self-pay | Admitting: Internal Medicine

## 2023-12-01 ENCOUNTER — Encounter (HOSPITAL_COMMUNITY)

## 2023-12-01 DIAGNOSIS — R42 Dizziness and giddiness: Secondary | ICD-10-CM

## 2023-12-01 NOTE — Telephone Encounter (Signed)
 Normal event monitor.  2% PVC burden noted (previously this was 22.9% prior to PCI).  2% PVC burden should not cause dizziness.  His dizziness is completely noncardiac based on event monitor findings. LONG TERM MONITOR-LIVE TELEMETRY AT (3-14 DAYS)  Patient informed and verbalized understanding of plan.

## 2023-12-01 NOTE — Telephone Encounter (Signed)
 Calling in regarding the heart monitor results. Please advise

## 2023-12-05 ENCOUNTER — Telehealth (HOSPITAL_COMMUNITY): Payer: Self-pay | Admitting: *Deleted

## 2023-12-05 ENCOUNTER — Encounter (HOSPITAL_COMMUNITY): Payer: Self-pay | Admitting: *Deleted

## 2023-12-05 DIAGNOSIS — Z955 Presence of coronary angioplasty implant and graft: Secondary | ICD-10-CM

## 2023-12-05 NOTE — Telephone Encounter (Signed)
 Pt called to say that he was still not feeling well.  He has a CT scan scheduled for next week and they also want him to see an ENT.  He would like to be placed on medical hold until he is able to return and feel better.  We currently have him out until 12/25/23.

## 2023-12-05 NOTE — Telephone Encounter (Signed)
 Patient is calling because ENT has not received the referral. Patient is calling to follow up. Please advise

## 2023-12-06 ENCOUNTER — Encounter (HOSPITAL_COMMUNITY)

## 2023-12-08 ENCOUNTER — Encounter (HOSPITAL_COMMUNITY)

## 2023-12-11 ENCOUNTER — Emergency Department (HOSPITAL_COMMUNITY)

## 2023-12-11 ENCOUNTER — Other Ambulatory Visit: Payer: Self-pay

## 2023-12-11 ENCOUNTER — Encounter (HOSPITAL_COMMUNITY): Payer: Self-pay

## 2023-12-11 ENCOUNTER — Emergency Department (HOSPITAL_COMMUNITY)
Admission: EM | Admit: 2023-12-11 | Discharge: 2023-12-11 | Disposition: A | Discharge status: Home / Self Care | Attending: Emergency Medicine | Admitting: Emergency Medicine

## 2023-12-11 ENCOUNTER — Encounter (HOSPITAL_COMMUNITY)

## 2023-12-11 DIAGNOSIS — R5383 Other fatigue: Secondary | ICD-10-CM | POA: Diagnosis present

## 2023-12-11 DIAGNOSIS — R2681 Unsteadiness on feet: Secondary | ICD-10-CM | POA: Diagnosis not present

## 2023-12-11 DIAGNOSIS — Z7982 Long term (current) use of aspirin: Secondary | ICD-10-CM | POA: Insufficient documentation

## 2023-12-11 DIAGNOSIS — R0602 Shortness of breath: Secondary | ICD-10-CM | POA: Insufficient documentation

## 2023-12-11 LAB — CBC WITH DIFFERENTIAL/PLATELET
Abs Immature Granulocytes: 0.14 K/uL — ABNORMAL HIGH (ref 0.00–0.07)
Basophils Absolute: 0.1 K/uL (ref 0.0–0.1)
Basophils Relative: 1 %
Eosinophils Absolute: 0 K/uL (ref 0.0–0.5)
Eosinophils Relative: 0 %
HCT: 44.2 % (ref 39.0–52.0)
Hemoglobin: 15.1 g/dL (ref 13.0–17.0)
Immature Granulocytes: 2 %
Lymphocytes Relative: 13 %
Lymphs Abs: 1.1 K/uL (ref 0.7–4.0)
MCH: 32.8 pg (ref 26.0–34.0)
MCHC: 34.2 g/dL (ref 30.0–36.0)
MCV: 96.1 fL (ref 80.0–100.0)
Monocytes Absolute: 0.6 K/uL (ref 0.1–1.0)
Monocytes Relative: 6 %
Neutro Abs: 6.8 K/uL (ref 1.7–7.7)
Neutrophils Relative %: 78 %
Platelets: 154 K/uL (ref 150–400)
RBC: 4.6 MIL/uL (ref 4.22–5.81)
RDW: 15.6 % — ABNORMAL HIGH (ref 11.5–15.5)
WBC: 8.7 K/uL (ref 4.0–10.5)
nRBC: 0 % (ref 0.0–0.2)

## 2023-12-11 LAB — COMPREHENSIVE METABOLIC PANEL WITH GFR
ALT: 33 U/L (ref 0–44)
AST: 23 U/L (ref 15–41)
Albumin: 3.8 g/dL (ref 3.5–5.0)
Alkaline Phosphatase: 79 U/L (ref 38–126)
Anion gap: 12 (ref 5–15)
BUN: 22 mg/dL (ref 8–23)
CO2: 25 mmol/L (ref 22–32)
Calcium: 8.9 mg/dL (ref 8.9–10.3)
Chloride: 101 mmol/L (ref 98–111)
Creatinine, Ser: 0.75 mg/dL (ref 0.61–1.24)
GFR, Estimated: >60 mL/min (ref >60)
Glucose, Bld: 147 mg/dL — ABNORMAL HIGH (ref 70–99)
Potassium: 3.5 mmol/L (ref 3.5–5.1)
Sodium: 138 mmol/L (ref 135–145)
Total Bilirubin: 0.9 mg/dL (ref 0.0–1.2)
Total Protein: 6.5 g/dL (ref 6.5–8.1)

## 2023-12-11 LAB — RESP PANEL BY RT-PCR (RSV, FLU A&B, COVID)  RVPGX2
Influenza A by PCR: NEGATIVE
Influenza B by PCR: NEGATIVE
Resp Syncytial Virus by PCR: NEGATIVE
SARS Coronavirus 2 by RT PCR: NEGATIVE

## 2023-12-11 LAB — BRAIN NATRIURETIC PEPTIDE: B Natriuretic Peptide: 34 pg/mL (ref 0.0–100.0)

## 2023-12-11 LAB — TROPONIN I (HIGH SENSITIVITY)
Troponin I (High Sensitivity): 6 ng/L (ref <18)
Troponin I (High Sensitivity): 6 ng/L (ref <18)

## 2023-12-11 LAB — MAGNESIUM: Magnesium: 2.1 mg/dL (ref 1.7–2.4)

## 2023-12-11 NOTE — ED Triage Notes (Signed)
 Pt arrived via POV c/o on-going fatigue and not feeling right since Saturday. Pt reports recent Stent placed in July. Pt reports feeling dizzy, and blurry vision as well.

## 2023-12-11 NOTE — ED Notes (Signed)
 ED Provider at bedside.

## 2023-12-11 NOTE — ED Notes (Signed)
 AVS provided by edp was reviewed with the pt. Pt verbalized understanding with no additional questions at this time. Pt wheelchair to car. Pt to go home with wife at bedside.

## 2023-12-11 NOTE — Discharge Instructions (Signed)
 As discussed, your evaluation today has been largely reassuring.  But, it is important that you monitor your condition carefully, and do not hesitate to return to the ED if you develop new, or concerning changes in your condition.  Otherwise, please follow-up with your physician for appropriate ongoing care.  Considerations for your ongoing unsteadiness include (but are not limited to ) vestibulitis, post cardiac procedure recovery, mild infection.  Discussed these with your physician.

## 2023-12-11 NOTE — ED Provider Notes (Signed)
 [facility] EMERGENCY DEPARTMENT AT [facility] Provider Note   CSN: [id number] Arrival date & time: 12/11/23  1348     Patient presents with: Fatigue   Thomas Summers is a 68 y.o. male.   HPI Patient presents with his wife who assists with the history. Patient has a history of cardiac stent placed 9 weeks ago.  In essence patient states that since that time he has felt differently from normal, and though he had no chest pain beforehand, it was generally well, he has now unsteady on his feet, with weakness, sense of listlessness. No true vertiginous room spinning sensation, but he notes that when upright he has unsteady, feels as though his ears need to be cleared. He has seen his cardiologist, has seen his primary care physician, has pending follow-up with ENT and neurology. With more pronounced symptoms over the past 2 days he presents for evaluation.    Prior to Admission medications   Medication Sig Start Date End Date Taking? Authorizing Provider  aspirin  EC 81 MG tablet Take 1 tablet (81 mg total) by mouth daily. Swallow whole. 10/04/23   Mallipeddi, Vishnu P, MD  atorvastatin  (LIPITOR) 80 MG tablet Take 1 tablet (80 mg total) by mouth at bedtime. 10/12/23   Henry Manuelita NOVAK, NP  blood glucose meter kit and supplies KIT Dispense based on patient and insurance preference. ( One touch ) diabetes type 2. E11.9. Test blood sugar once a day 04/08/16   [name], MD  clopidogrel  (PLAVIX ) 75 MG tablet Take 1 tablet (75 mg total) by mouth daily with breakfast. 10/13/23   Henry Manuelita NOVAK, NP  fluticasone  (FLONASE ) 50 MCG/ACT nasal spray Place 1 spray into both nostrils daily as needed for allergies or rhinitis. 02/18/23   [provider]  glucose blood test strip Use 1x per day as directed. 11/27/20   Waddell Catholic M, DO  hydrOXYzine  (ATARAX ) 25 MG tablet Take one every 12 hours for anxiety or  sleep 10/21/23   Suzette Pac, MD  losartan  (COZAAR ) 25 MG  tablet Take 1 tablet (25 mg total) by mouth 2 (two) times daily. 11/07/23   Mallipeddi, Vishnu P, MD  metFORMIN (GLUCOPHAGE) 500 MG tablet Take 500 mg by mouth daily. 05/05/22   [provider]  nitroGLYCERIN  (NITROSTAT ) 0.4 MG SL tablet Place 1 tablet (0.4 mg total) under the tongue every 5 (five) minutes as needed for chest pain. 10/12/23 10/11/24  Henry Manuelita NOVAK, NP  OneTouch Delica Lancets 33G MISC USE ONE  TO CHECK GLUCOSE ONCE DAILY 11/27/20   Waddell, Malena M, DO  silodosin  (RAPAFLO ) 8 MG CAPS capsule TAKE 1 CAPSULE BY MOUTH ONCE DAILY WITH BREAKFAST 09/08/21   McKenzie, Belvie CROME, MD    Allergies: Patient has no known allergies.    Review of Systems  Updated Vital Signs BP (!) 159/87 (BP Location: Right Arm)   Pulse 95   Temp 98 F (36.7 C) (Oral)   Resp 16   Ht 1.88 m (6' 2)   Wt 89.3 kg   SpO2 100%   BMI 25.28 kg/m   Physical Exam Vitals and nursing note reviewed.  Constitutional:      General: He is not in acute distress.    Appearance: He is well-developed.  HENT:     Head: Normocephalic and atraumatic.  Eyes:     Conjunctiva/sclera: Conjunctivae normal.  Cardiovascular:     Rate and Rhythm: Normal rate and regular rhythm.  Pulmonary:  Effort: Pulmonary effort is normal. No respiratory distress.     Breath sounds: No stridor.  Abdominal:     General: There is no distension.  Skin:    General: Skin is warm and dry.  Neurological:     General: No focal deficit present.     Mental Status: He is alert and oriented to person, place, and time.     Motor: No weakness.     Coordination: Coordination normal.     (all labs ordered are listed, but only abnormal results are displayed) Labs Reviewed  COMPREHENSIVE METABOLIC PANEL WITH GFR - Abnormal; Notable for the following components:      Result Value   Glucose, Bld 147 (*)    All other components within normal limits  CBC WITH DIFFERENTIAL/PLATELET - Abnormal; Notable for the following components:    RDW 15.6 (*)    Abs Immature Granulocytes 0.14 (*)    All other components within normal limits  RESP PANEL BY RT-PCR (RSV, FLU A&B, COVID)  [id number]  BRAIN NATRIURETIC PEPTIDE  MAGNESIUM  TROPONIN I (HIGH SENSITIVITY)  TROPONIN I (HIGH SENSITIVITY)    EKG: EKG Interpretation Date/Time:  Monday December 11 2023 15:03:28 EDT Ventricular Rate:  82 PR Interval:  152 QRS Duration:  126 QT Interval:  420 QTC Calculation: 490 R Axis:   -24  Text Interpretation: Sinus rhythm with occasional Premature ventricular complexes Right bundle branch block Minimal voltage criteria for LVH, may be normal variant ( R in aVL ) Abnormal ECG Confirmed by Garrick Charleston [phone]) on 12/11/2023 5:16:27 PM  Radiology: MR BRAIN WO CONTRAST Result Date: 12/11/2023 EXAM: MRI BRAIN WITHOUT CONTRAST 12/11/2023 06:32:45 PM TECHNIQUE: Multiplanar multisequence MRI of the head/brain was performed without the administration of intravenous contrast. COMPARISON: None available. CLINICAL HISTORY: Neuro deficit, acute, stroke suspected; gait challenges, new, concern for posterior CVA. FINDINGS: BRAIN AND VENTRICLES: No acute infarct. No intracranial hemorrhage. No mass. No midline shift. No hydrocephalus. The sella is unremarkable. Normal flow voids. Minimal scattered foci of T2/FLAIR intensity in the periventricular and subcortical white matter likely reflecting chronic microvascular ischemic changes. ORBITS: No acute abnormality. SINUSES AND MASTOIDS: Trace effusion in the right mastoid tip. BONES AND SOFT TISSUES: Normal marrow signal. No acute soft tissue abnormality. IMPRESSION: 1. No acute intracranial abnormality. Electronically signed by: Donnice Mania MD 12/11/2023 07:26 PM EDT RP Workstation: [id number]   DG Chest Port 1 View Result Date: 12/11/2023 CLINICAL DATA:  Short of breath EXAM: PORTABLE CHEST 1 VIEW COMPARISON:  10/16/2023 FINDINGS: Single frontal view of the chest demonstrates an unremarkable cardiac silhouette.  No acute airspace disease, effusion, or pneumothorax. No acute bony abnormalities. IMPRESSION: 1. Stable chest, no acute process. Electronically Signed   By: Ozell Daring M.D.   On: 12/11/2023 15:09     Procedures   Medications Ordered in the ED - No data to display                                  Medical Decision Making Adult male, now status post cardiac stent presents with ongoing weakness, unsteadiness, no focal neuro abnormality, no focal physical exam finding, no hemodynamic instability.  He has been normal with his physicians, had has occasion changes, though medication effects remain a possibility. Other considerations include bacteremia, sepsis, vestibulitis, electrolyte abnormalities, arrhythmia. Patient has had Holter monitor since the stent, without alarming findings, however. Cardiac 95 sinus normal pulse ox 100% room air  normal  Amount and/or Complexity of Data Reviewed Independent Historian: spouse External Data Reviewed: notes. Labs: ordered. Decision-making details documented in ED Course. Radiology: ordered and independent interpretation performed. Decision-making details documented in ED Course. ECG/medicine tests: ordered and independent interpretation performed. Decision-making details documented in ED Course.  Update: Patient in no distress initial studies reassuring. 7:59 PM MRI results available reassuring, no stroke.  Close to normal troponin, nonischemic EKG, reassuring labs, and his description of symptoms for some time suspicion for vestibulitis or some similar pathology, otherwise no evidence for acute new phenomena.  He, his wife and I had a lengthy conversation about this, importance of follow-up, with primary care and is scheduled previously with ENT and neurology.     Final diagnoses:  Unsteadiness    ED Discharge Orders     None          Garrick Charleston, MD 12/11/23 [phone]

## 2023-12-12 ENCOUNTER — Telehealth: Payer: Self-pay | Admitting: Internal Medicine

## 2023-12-12 NOTE — Telephone Encounter (Signed)
 Patient went to the ER at [facility] on 12-11-2023. They did an MRI of the brain. He is wanting to know if she needs CT scan tomorrow.

## 2023-12-13 ENCOUNTER — Ambulatory Visit (HOSPITAL_COMMUNITY)

## 2023-12-13 ENCOUNTER — Encounter (HOSPITAL_COMMUNITY)

## 2023-12-13 ENCOUNTER — Encounter (HOSPITAL_COMMUNITY): Payer: Self-pay | Admitting: *Deleted

## 2023-12-13 DIAGNOSIS — Z955 Presence of coronary angioplasty implant and graft: Secondary | ICD-10-CM

## 2023-12-13 NOTE — Progress Notes (Signed)
 Cardiac Individual Treatment Plan  Patient Details  Name: Thomas Summers MRN: [id number] Date of Birth: Jun 10, 1955 Referring Provider:   Flowsheet Row CARDIAC REHAB PHASE II ORIENTATION from 11/14/2023 in [facility] CARDIAC REHABILITATION  Referring Provider Stacia Diannah Liv MD    Initial Encounter Date:  Flowsheet Row CARDIAC REHAB PHASE II ORIENTATION from 11/14/2023 in [place] CARDIAC REHABILITATION  Date 11/14/23    Visit Diagnosis: Status post coronary artery stent placement  Patient's Home Medications on Admission:  Current Outpatient Medications:    aspirin  EC 81 MG tablet, Take 1 tablet (81 mg total) by mouth daily. Swallow whole., Disp: 30 tablet, Rfl: 5   atorvastatin  (LIPITOR) 80 MG tablet, Take 1 tablet (80 mg total) by mouth at bedtime., Disp: 90 tablet, Rfl: 3   blood glucose meter kit and supplies KIT, Dispense based on patient and insurance preference. ( One touch ) diabetes type 2. E11.9. Test blood sugar once a day, Disp: 1 each, Rfl: 5   clopidogrel  (PLAVIX ) 75 MG tablet, Take 1 tablet (75 mg total) by mouth daily with breakfast., Disp: 90 tablet, Rfl: 3   fluticasone  (FLONASE ) 50 MCG/ACT nasal spray, Place 1 spray into both nostrils daily as needed for allergies or rhinitis., Disp: , Rfl:    glucose blood test strip, Use 1x per day as directed., Disp: 100 each, Rfl: 3   hydrOXYzine  (ATARAX ) 25 MG tablet, Take one every 12 hours for anxiety or  sleep, Disp: 20 tablet, Rfl: 1   losartan  (COZAAR ) 25 MG tablet, Take 1 tablet (25 mg total) by mouth 2 (two) times daily., Disp: 60 tablet, Rfl: 3   metFORMIN (GLUCOPHAGE) 500 MG tablet, Take 500 mg by mouth daily., Disp: , Rfl:    nitroGLYCERIN  (NITROSTAT ) 0.4 MG SL tablet, Place 1 tablet (0.4 mg total) under the tongue every 5 (five) minutes as needed for chest pain., Disp: 25 tablet, Rfl: 3   OneTouch Delica Lancets 33G MISC, USE ONE  TO CHECK GLUCOSE ONCE DAILY, Disp: 100 each, Rfl: 1   silodosin  (RAPAFLO ) 8  MG CAPS capsule, TAKE 1 CAPSULE BY MOUTH ONCE DAILY WITH BREAKFAST, Disp: 30 capsule, Rfl: 0  Past Medical History: Past Medical History:  Diagnosis Date   Allergy    Depression    Diabetes mellitus without complication (HCC)    Heart murmur    Hyperlipidemia    Hypertension     Tobacco Use: Social History   Tobacco Use  Smoking Status Never  Smokeless Tobacco Never    Labs: Review Flowsheet  More data exists      Latest Ref Rng & Units 05/08/2019 12/18/2019 03/02/2020 11/10/2020 10/12/2023  Labs for ITP Cardiac and Pulmonary Rehab  Cholestrol 0 - 200 mg/dL 801  871  - 850  890   LDL (calc) 0 - 99 mg/dL 873  60  - 71  47   HDL-C >40 mg/dL 54  53  - 63  49   Trlycerides <150 mg/dL 898  78  - 77  66   Hemoglobin A1c 4.8 - 5.6 % 6.6  6.5  5.7  5.9  5.6     Capillary Blood Glucose: Lab Results  Component Value Date   GLUCAP 113 (H) 11/22/2023   GLUCAP 100 (H) 11/17/2023   GLUCAP 107 (H) 11/15/2023   GLUCAP 80 11/15/2023   GLUCAP 92 10/21/2023     Exercise Target Goals: Exercise Program Goal: Individual exercise prescription set using results from initial 6 min walk test and THRR  while considering  patient's activity barriers and safety.   Exercise Prescription Goal: Starting with aerobic activity 30 plus minutes a day, 3 days per week for initial exercise prescription. Provide home exercise prescription and guidelines that participant acknowledges understanding prior to discharge.  Activity Barriers & Risk Stratification:  Activity Barriers & Cardiac Risk Stratification - 11/14/23 1008       Activity Barriers & Cardiac Risk Stratification   Activity Barriers Balance Concerns;Deconditioning;Muscular Weakness;Decreased Ventricular Function;Other (comment)    Comments dizziness/lightheaded as blood pressure increases    Cardiac Risk Stratification Moderate          6 Minute Walk:  6 Minute Walk     Row Name 11/14/23 0959         6 Minute Walk   Phase  Initial     Distance 1245 feet     Walk Time 6 minutes     # of Rest Breaks 0     MPH 2.36     METS 3.49     RPE 13     VO2 Peak 12.23     Symptoms Yes (comment)     Comments dizzy/lightheaded from increased blood pressure, relieved as pressure returned to normal     Resting HR 86 bpm     Resting BP 144/62     Resting Oxygen Saturation  98 %     Exercise Oxygen Saturation  during 6 min walk 98 %     Max Ex. HR 102 bpm     Max Ex. BP 170/74     2 Minute Post BP 138/60        Oxygen Initial Assessment:   Oxygen Re-Evaluation:   Oxygen Discharge (Final Oxygen Re-Evaluation):   Initial Exercise Prescription:  Initial Exercise Prescription - 11/14/23 1000       Date of Initial Exercise RX and Referring Provider   Date 11/14/23    Referring Provider Mallipeddi, Diannah Liv MD      Oxygen   Maintain Oxygen Saturation 88% or higher      Treadmill   MPH 2.3    Grade 1    Minutes 15    METs 3.08      REL-XR   Level 3    Speed 50    Minutes 15    METs 3      Prescription Details   Frequency (times per week) 3    Duration Progress to 30 minutes of continuous aerobic without signs/symptoms of physical distress      Intensity   THRR 40-80% of Max Heartrate 112-139    Ratings of Perceived Exertion 11-13    Perceived Dyspnea 0-4      Progression   Progression Continue to progress workloads to maintain intensity without signs/symptoms of physical distress.      Resistance Training   Training Prescription Yes    Weight 5 lb    Reps 10-15          Perform Capillary Blood Glucose checks as needed.  Exercise Prescription Changes:   Exercise Prescription Changes     Row Name 11/14/23 1000 11/17/23 0800           Response to Exercise   Blood Pressure (Admit) 144/62 126/72      Blood Pressure (Exercise) 170/74 150/72      Blood Pressure (Exit) 138/60 136/70      Heart Rate (Admit) 86 bpm 90 bpm      Heart Rate (Exercise) 102 bpm 124 bpm  Heart  Rate (Exit) 102 bpm 105 bpm      Oxygen Saturation (Admit) 98 % --      Oxygen Saturation (Exercise) 98 % --      Rating of Perceived Exertion (Exercise) 13 12      Symptoms dizzy/lightheaded from high BP --      Comments walk test results --      Duration -- Continue with 30 min of aerobic exercise without signs/symptoms of physical distress.      Intensity -- THRR unchanged        Progression   Progression -- Continue to progress workloads to maintain intensity without signs/symptoms of physical distress.        Resistance Training   Training Prescription -- Yes      Weight -- 5      Reps -- 10-15        Treadmill   MPH -- 1.3      Grade -- 0      Minutes -- 15      METs -- 1.99        REL-XR   Level -- 4      Speed -- 56      Minutes -- 15      METs -- 4.2         Exercise Comments:   Exercise Comments     Row Name 11/15/23 1043           Exercise Comments First full day of exercise!  Patient was oriented to gym and equipment including functions, settings, policies, and procedures.  Patient's individual exercise prescription and treatment plan were reviewed.  All starting workloads were established based on the results of the 6 minute walk test done at initial orientation visit.  The plan for exercise progression was also introduced and progression will be customized based on patient's performance and goals.          Exercise Goals and Review:   Exercise Goals     Row Name 11/14/23 1012             Exercise Goals   Increase Physical Activity Yes       Intervention Provide advice, education, support and counseling about physical activity/exercise needs.;Develop an individualized exercise prescription for aerobic and resistive training based on initial evaluation findings, risk stratification, comorbidities and participant's personal goals.       Expected Outcomes Short Term: Attend rehab on a regular basis to increase amount of physical activity.;Long Term:  Exercising regularly at least 3-5 days a week.;Long Term: Add in home exercise to make exercise part of routine and to increase amount of physical activity.       Increase Strength and Stamina Yes       Intervention Provide advice, education, support and counseling about physical activity/exercise needs.;Develop an individualized exercise prescription for aerobic and resistive training based on initial evaluation findings, risk stratification, comorbidities and participant's personal goals.       Expected Outcomes Short Term: Increase workloads from initial exercise prescription for resistance, speed, and METs.;Short Term: Perform resistance training exercises routinely during rehab and add in resistance training at home;Long Term: Improve cardiorespiratory fitness, muscular endurance and strength as measured by increased METs and functional capacity ( )       Able to understand and use rate of perceived exertion (RPE) scale Yes       Intervention Provide education and explanation on how to use RPE scale  Expected Outcomes Short Term: Able to use RPE daily in rehab to express subjective intensity level;Long Term:  Able to use RPE to guide intensity level when exercising independently       Able to understand and use Dyspnea scale Yes       Intervention Provide education and explanation on how to use Dyspnea scale       Expected Outcomes Short Term: Able to use Dyspnea scale daily in rehab to express subjective sense of shortness of breath during exertion;Long Term: Able to use Dyspnea scale to guide intensity level when exercising independently       Knowledge and understanding of Target Heart Rate Range (THRR) Yes       Intervention Provide education and explanation of THRR including how the numbers were predicted and where they are located for reference       Expected Outcomes Short Term: Able to state/look up THRR;Long Term: Able to use THRR to govern intensity when exercising  independently;Short Term: Able to use daily as guideline for intensity in rehab       Able to check pulse independently Yes       Intervention Provide education and demonstration on how to check pulse in carotid and radial arteries.;Review the importance of being able to check your own pulse for safety during independent exercise       Expected Outcomes Short Term: Able to explain why pulse checking is important during independent exercise;Long Term: Able to check pulse independently and accurately       Understanding of Exercise Prescription Yes       Intervention Provide education, explanation, and written materials on patient's individual exercise prescription       Expected Outcomes Short Term: Able to explain program exercise prescription;Long Term: Able to explain home exercise prescription to exercise independently          Exercise Goals Re-Evaluation :  Exercise Goals Re-Evaluation     Row Name 11/15/23 1044             Exercise Goal Re-Evaluation   Exercise Goals Review Able to understand and use rate of perceived exertion (RPE) scale;Knowledge and understanding of Target Heart Rate Range (THRR)       Comments Reviewed RPE and dyspnea scale, THR and program prescription with pt today.  Pt voiced understanding and was given a copy of goals to take home.       Expected Outcomes Short: Use RPE daily to regulate intensity.  Long: Follow program prescription in THR.           Discharge Exercise Prescription (Final Exercise Prescription Changes):  Exercise Prescription Changes - 11/17/23 0800       Response to Exercise   Blood Pressure (Admit) 126/72    Blood Pressure (Exercise) 150/72    Blood Pressure (Exit) 136/70    Heart Rate (Admit) 90 bpm    Heart Rate (Exercise) 124 bpm    Heart Rate (Exit) 105 bpm    Rating of Perceived Exertion (Exercise) 12    Duration Continue with 30 min of aerobic exercise without signs/symptoms of physical distress.    Intensity THRR  unchanged      Progression   Progression Continue to progress workloads to maintain intensity without signs/symptoms of physical distress.      Resistance Training   Training Prescription Yes    Weight 5    Reps 10-15      Treadmill   MPH 1.3    Grade 0  Minutes 15    METs 1.99      REL-XR   Level 4    Speed 56    Minutes 15    METs 4.2          Nutrition:  Target Goals: Understanding of nutrition guidelines, daily intake of sodium 1500mg , cholesterol 200mg , calories 30% from fat and 7% or less from saturated fats, daily to have 5 or more servings of fruits and vegetables.  Biometrics:  Pre Biometrics - 11/14/23 1012       Pre Biometrics   Height 6' 2 (1.88 m)    Weight 196 lb 12.8 oz (89.3 kg)    Waist Circumference 42 inches    Hip Circumference 36 inches    Waist to Hip Ratio 1.17 %    BMI (Calculated) 25.26    Grip Strength 27.4 kg    Single Leg Stand 7.2 seconds           Nutrition Therapy Plan and Nutrition Goals:  Nutrition Therapy & Goals - 11/14/23 1013       Intervention Plan   Intervention Prescribe, educate and counsel regarding individualized specific dietary modifications aiming towards targeted core components such as weight, hypertension, lipid management, diabetes, heart failure and other comorbidities.;Nutrition handout(s) given to patient.    Expected Outcomes Short Term Goal: Understand basic principles of dietary content, such as calories, fat, sodium, cholesterol and nutrients.;Long Term Goal: Adherence to prescribed nutrition plan.          Nutrition Assessments:  MEDIFICTS Score Key: >=70 Need to make dietary changes  40-70 Heart Healthy Diet <= 40 Therapeutic Level Cholesterol Diet  Flowsheet Row CARDIAC VIRTUAL BASED CARE from 11/10/2023 in [facility] CARDIAC REHABILITATION  Picture Your Plate Total Score on Admission 85   Picture Your Plate Scores: <59 Unhealthy dietary pattern with much room for improvement. 41-50  Dietary pattern unlikely to meet recommendations for good health and room for improvement. 51-60 More healthful dietary pattern, with some room for improvement.  >60 Healthy dietary pattern, although there may be some specific behaviors that could be improved.    Nutrition Goals Re-Evaluation:   Nutrition Goals Discharge (Final Nutrition Goals Re-Evaluation):   Psychosocial: Target Goals: Acknowledge presence or absence of significant depression and/or stress, maximize coping skills, provide positive support system. Participant is able to verbalize types and ability to use techniques and skills needed for reducing stress and depression.  Initial Review & Psychosocial Screening:  Initial Psych Review & Screening - 11/10/23 0900       Initial Review   Current issues with Current Sleep Concerns;Current Anxiety/Panic      Family Dynamics   Good Support System? Yes      Barriers   Psychosocial barriers to participate in program The patient should benefit from training in stress management and relaxation.;There are no identifiable barriers or psychosocial needs.      Screening Interventions   Interventions Encouraged to exercise;To provide support and resources with identified psychosocial needs;Provide feedback about the scores to participant    Expected Outcomes Short Term goal: Utilizing psychosocial counselor, staff and physician to assist with identification of specific Stressors or current issues interfering with healing process. Setting desired goal for each stressor or current issue identified.;Long Term Goal: Stressors or current issues are controlled or eliminated.;Short Term goal: Identification and review with participant of any Quality of Life or Depression concerns found by scoring the questionnaire.;Long Term goal: The participant improves quality of Life and PHQ9 Scores as seen  by post scores and/or verbalization of changes          Quality of Life Scores:  Quality of  Life - 11/10/23 1114       Quality of Life   Select Quality of Life      Quality of Life Scores   Health/Function Pre 21.43 %    Socioeconomic Pre 21.69 %    Psych/Spiritual Pre 20.64 %    Family Pre 24.7 %    GLOBAL Pre 21.18 %         Scores of 19 and below usually indicate a poorer quality of life in these areas.  A difference of  2-3 points is a clinically meaningful difference.  A difference of 2-3 points in the total score of the Quality of Life Index has been associated with significant improvement in overall quality of life, self-image, physical symptoms, and general health in studies assessing change in quality of life.  PHQ-9: Review Flowsheet  More data may exist      11/14/2023 06/01/2020 03/13/2020 04/12/2017 07/12/2016  Depression screen PHQ 2/9  Decreased Interest 0 0 0 0 0  Down, Depressed, Hopeless 0 0 0 0 0  PHQ - 2 Score 0 0 0 0 0  Altered sleeping 0 - - - -  Tired, decreased energy 3 - - - -  Change in appetite 0 - - - -  Feeling bad or failure about yourself  0 - - - -  Trouble concentrating 0 - - - -  Moving slowly or fidgety/restless 1 - - - -  Suicidal thoughts 0 - - - -  PHQ-9 Score 4 - - - -  Difficult doing work/chores Not difficult at all - - - -   Interpretation of Total Score  Total Score Depression Severity:  1-4 = Minimal depression, 5-9 = Mild depression, 10-14 = Moderate depression, 15-19 = Moderately severe depression, 20-27 = Severe depression   Psychosocial Evaluation and Intervention:  Psychosocial Evaluation - 11/10/23 0900       Psychosocial Evaluation & Interventions   Interventions Stress management education;Relaxation education;Encouraged to exercise with the program and follow exercise prescription    Comments Patient was referred to [facility] with stent placement. He denies any depression but has had some anxiety since his stent due to chronic dizziness and elevated B/P. He has been treated in the ED for this since his stent. His  symptoms are thought to be caused by PVC burden. Dr. Mallipeddi adjusted his medications 8/6 and put him on a 10 day monitor. He says his dizziness has increased since the med change and plans to call her office today. He was also started on Hydroxyzine  for the anxiety but he says he has not used yet. He says he does not sleep well having trouble staying asleep. He is waiting on a sleep study. He lives with his wife who is his main support along with his chruch family and friends. His goals for the program are to increase his strength and continue to lose weight. He started a weight loss diet in March and has lost 25 lbs. He has no barriers identified to complete the program but is concerned about being able to exercise due to the dizziness.    Expected Outcomes Short Term: Patient will start the program and attend consistently. Long Term: Patient will complete the program meeting personal goals.    Continue Psychosocial Services  Follow up required by staff  Psychosocial Re-Evaluation:   Psychosocial Discharge (Final Psychosocial Re-Evaluation):   Vocational Rehabilitation: Provide vocational rehab assistance to qualifying candidates.   Vocational Rehab Evaluation & Intervention:  Vocational Rehab - 11/10/23 0859       Initial Vocational Rehab Evaluation & Intervention   Assessment shows need for Vocational Rehabilitation No      Vocational Rehab Re-Evaulation   Comments Patient is retired.          Education: Education Goals: Education classes will be provided on a weekly basis, covering required topics. Participant will state understanding/return demonstration of topics presented.  Learning Barriers/Preferences:  Learning Barriers/Preferences - 11/14/23 0846       Learning Barriers/Preferences   Learning Barriers Hearing    Learning Preferences Written Material          Education Topics: Hypertension, Hypertension Reduction -Define heart disease and high  blood pressure. Discus how high blood pressure affects the body and ways to reduce high blood pressure.   Exercise and Your Heart -Discuss why it is important to exercise, the FITT principles of exercise, normal and abnormal responses to exercise, and how to exercise safely.   Angina -Discuss definition of angina, causes of angina, treatment of angina, and how to decrease risk of having angina.   Cardiac Medications -Review what the following cardiac medications are used for, how they affect the body, and side effects that may occur when taking the medications.  Medications include Aspirin , Beta blockers, calcium  channel blockers, ACE Inhibitors, angiotensin receptor blockers, diuretics, digoxin, and antihyperlipidemics.   Congestive Heart Failure -Discuss the definition of CHF, how to live with CHF, the signs and symptoms of CHF, and how keep track of weight and sodium intake.   Heart Disease and Intimacy -Discus the effect sexual activity has on the heart, how changes occur during intimacy as we age, and safety during sexual activity.   Smoking Cessation / COPD -Discuss different methods to quit smoking, the health benefits of quitting smoking, and the definition of COPD.   Nutrition I: Fats -Discuss the types of cholesterol, what cholesterol does to the heart, and how cholesterol levels can be controlled.   Nutrition II: Labels -Discuss the different components of food labels and how to read food label   Heart Parts/Heart Disease and PAD -Discuss the anatomy of the heart, the pathway of blood circulation through the heart, and these are affected by heart disease.   Stress I: Signs and Symptoms -Discuss the causes of stress, how stress may lead to anxiety and depression, and ways to limit stress.   Stress II: Relaxation -Discuss different types of relaxation techniques to limit stress.   Warning Signs of Stroke / TIA -Discuss definition of a stroke, what the signs  and symptoms are of a stroke, and how to identify when someone is having stroke.   Knowledge Questionnaire Score:  Knowledge Questionnaire Score - 11/10/23 1114       Knowledge Questionnaire Score   Pre Score 26/26          Core Components/Risk Factors/Patient Goals at Admission:  Personal Goals and Risk Factors at Admission - 11/14/23 1013       Core Components/Risk Factors/Patient Goals on Admission    Weight Management Yes;Weight Loss;Weight Maintenance    Intervention Weight Management: Develop a combined nutrition and exercise program designed to reach desired caloric intake, while maintaining appropriate intake of nutrient and fiber, sodium and fats, and appropriate energy expenditure required for the weight goal.;Weight Management: Provide education and appropriate resources  to help participant work on and attain dietary goals.;Weight Management/Obesity: Establish reasonable short term and long term weight goals.    Admit Weight 196 lb 12.8 oz (89.3 kg)    Goal Weight: Short Term 191 lb (86.6 kg)    Goal Weight: Long Term 190 lb (86.2 kg)    Expected Outcomes Short Term: Continue to assess and modify interventions until short term weight is achieved;Long Term: Adherence to nutrition and physical activity/exercise program aimed toward attainment of established weight goal;Weight Loss: Understanding of general recommendations for a balanced deficit meal plan, which promotes 1-2 lb weight loss per week and includes a negative energy balance of [phone] kcal/d;Understanding recommendations for meals to include 15-35% energy as protein, 25-35% energy from fat, 35-60% energy from carbohydrates, less than 200mg  of dietary cholesterol, 20-35 gm of total fiber daily;Understanding of distribution of calorie intake throughout the day with the consumption of 4-5 meals/snacks;Weight Maintenance: Understanding of the daily nutrition guidelines, which includes 25-35% calories from fat, 7% or less cal  from saturated fats, less than 200mg  cholesterol, less than 1.5gm of sodium, & 5 or more servings of fruits and vegetables daily    Diabetes Yes    Intervention Provide education about signs/symptoms and action to take for hypo/hyperglycemia.;Provide education about proper nutrition, including hydration, and aerobic/resistive exercise prescription along with prescribed medications to achieve blood glucose in normal ranges: Fasting glucose 65-99 mg/dL    Expected Outcomes Short Term: Participant verbalizes understanding of the signs/symptoms and immediate care of hyper/hypoglycemia, proper foot care and importance of medication, aerobic/resistive exercise and nutrition plan for blood glucose control.;Long Term: Attainment of HbA1C < 7%.    Heart Failure Yes    Intervention Provide a combined exercise and nutrition program that is supplemented with education, support and counseling about heart failure. Directed toward relieving symptoms such as shortness of breath, decreased exercise tolerance, and extremity edema.    Expected Outcomes Improve functional capacity of life;Short term: Attendance in program 2-3 days a week with increased exercise capacity. Reported lower sodium intake. Reported increased fruit and vegetable intake. Reports medication compliance.;Short term: Daily weights obtained and reported for increase. Utilizing diuretic protocols set by physician.;Long term: Adoption of self-care skills and reduction of barriers for early signs and symptoms recognition and intervention leading to self-care maintenance.    Hypertension Yes    Intervention Provide education on lifestyle modifcations including regular physical activity/exercise, weight management, moderate sodium restriction and increased consumption of fresh fruit, vegetables, and low fat dairy, alcohol moderation, and smoking cessation.;Monitor prescription use compliance.    Expected Outcomes Short Term: Continued assessment and  intervention until BP is < 140/7mm HG in hypertensive participants. < 130/93mm HG in hypertensive participants with diabetes, heart failure or chronic kidney disease.;Long Term: Maintenance of blood pressure at goal levels.    Lipids Yes    Intervention Provide education and support for participant on nutrition & aerobic/resistive exercise along with prescribed medications to achieve LDL 70mg , HDL >40mg .    Expected Outcomes Short Term: Participant states understanding of desired cholesterol values and is compliant with medications prescribed. Participant is following exercise prescription and nutrition guidelines.;Long Term: Cholesterol controlled with medications as prescribed, with individualized exercise RX and with personalized nutrition plan. Value goals: LDL < 70mg , HDL > 40 mg.          Core Components/Risk Factors/Patient Goals Review:    Core Components/Risk Factors/Patient Goals at Discharge (Final Review):    ITP Comments:  ITP Comments     Row  Name 11/10/23 0948 11/14/23 0959 11/15/23 1013 11/15/23 1043 12/05/23 0931   ITP Comments Virtual orientation visit completed for cardiac rehab with stent placement. On-site orientation visit scheduled for 11/14/23 at 0830. Patient attend orientation today.  Patient is attending Cardiac Rehabilitation Program.  Documentation for diagnosis can be found in [facility] 10/11/23.  Reviewed medical chart, RPE/RPD, gym safety, and program guidelines.  Patient was fitted to equipment they will be using during rehab.  Patient is scheduled to start exercise on Wednesday 11/15/23 at 1100.   Initial ITP created and sent for review and signature by Dr. Dorn Ross, Medical Director for Cardiac Rehabilitation Program. 30 day review completed. ITP sent to Dr. Dorn Ross, Medical Director of Cardiac Rehab. Continue with ITP unless changes are made by physician.  Only oriented yesterday. First full day of exercise!  Patient was oriented to gym and equipment  including functions, settings, policies, and procedures.  Patient's individual exercise prescription and treatment plan were reviewed.  All starting workloads were established based on the results of the 6 minute walk test done at initial orientation visit.  The plan for exercise progression was also introduced and progression will be customized based on patient's performance and goals. Pt called to say that he was still not feeling well.  He has a CT scan scheduled for next week and they also want him to see an ENT.  He would like to be placed on medical hold until he is able to return and feel better.  We currently have him out until 12/25/23.    Row Name 12/13/23 1442           ITP Comments 30 day review completed. ITP sent to Dr. Dorn Ross, Medical Director of Cardiac Rehab. Continue with ITP unless changes are made by physician.  Still newer to program, but currently out on medical hold.          Comments: 30 day review

## 2023-12-14 NOTE — Telephone Encounter (Signed)
 Patient informed and verbalized understanding of plan.

## 2023-12-15 ENCOUNTER — Encounter (HOSPITAL_COMMUNITY)

## 2023-12-18 ENCOUNTER — Encounter (HOSPITAL_COMMUNITY)

## 2023-12-20 ENCOUNTER — Encounter (HOSPITAL_COMMUNITY)

## 2023-12-20 ENCOUNTER — Telehealth: Payer: Self-pay | Admitting: Internal Medicine

## 2023-12-20 DIAGNOSIS — E785 Hyperlipidemia, unspecified: Secondary | ICD-10-CM

## 2023-12-20 NOTE — Telephone Encounter (Signed)
 Pt c/o medication issue:  1. Name of Medication: Lipitor 80 mg   2. How are you currently taking this medication (dosage and times per day)? daily at bedtime  3. Are you having a reaction (difficulty breathing--STAT)? no  4. What is your medication issue? Patient states that since taking the 80mg  he stays lightheaded. Patient wants to know if he needs to cut the dosage.

## 2023-12-20 NOTE — Telephone Encounter (Signed)
 Spoke to patient who stated that he has been on Atorvastatin  80 mg for 9 weeks and since increasing dosage from 20 mg, pt has been feeling more fatigue and light headed. Pt denied muscle aches or pains. Pt is requesting to reduce Atorvastatin  to 40 mg daily to see if this helps his symptoms.   Please advise.

## 2023-12-21 ENCOUNTER — Other Ambulatory Visit (HOSPITAL_COMMUNITY): Payer: Self-pay

## 2023-12-22 ENCOUNTER — Encounter (HOSPITAL_COMMUNITY)

## 2023-12-25 ENCOUNTER — Encounter (HOSPITAL_COMMUNITY)
Admission: RE | Admit: 2023-12-25 | Discharge: 2023-12-25 | Disposition: A | Discharge status: Home / Self Care | Source: Ambulatory Visit | Attending: Internal Medicine | Admitting: Internal Medicine

## 2023-12-25 DIAGNOSIS — Z955 Presence of coronary angioplasty implant and graft: Secondary | ICD-10-CM | POA: Insufficient documentation

## 2023-12-25 MED ORDER — ATORVASTATIN CALCIUM 40 MG PO TABS: 40.0000 mg | ORAL_TABLET | Freq: Every day | ORAL | 3 refills | Status: DC

## 2023-12-25 NOTE — Telephone Encounter (Signed)
 Patient notified and verbalized understanding. New RX for Atorvastatin  40 mg send the [facility] per patients request.

## 2023-12-25 NOTE — Progress Notes (Signed)
 Daily Session Note  Patient Details  Name: YASHUA BRACCO MRN: [id number] Date of Birth: [birth year] Referring Provider:   Flowsheet Row CARDIAC REHAB PHASE II ORIENTATION from 11/14/2023 in [facility] CARDIAC REHABILITATION  Referring Provider Stacia Diannah Liv MD    Encounter Date: 12/25/2023  Check In:  Session Check In - 12/25/23 1129       Check-In   Supervising physician immediately available to respond to emergencies See telemetry face sheet for immediately available MD    Location [facility]    Staff Present Powell Benders, BS, Exercise Physiologist;Victoria Zina, RN;Brittany Jackquline, BSN, RN, WTA-C    Virtual Visit No    Medication changes reported     Yes    Comments changed statin medication starting today    Fall or balance concerns reported    No    Warm-up and Cool-down Performed on first and last piece of equipment    Resistance Training Performed Yes    VAD Patient? No    PAD/SET Patient? No      Pain Assessment   Currently in Pain? No/denies          Capillary Blood Glucose: No results found for this or any previous visit (from the past 24 hours).    Social History   Tobacco Use  Smoking Status Never  Smokeless Tobacco Never    Goals Met:  Independence with exercise equipment Exercise tolerated well No report of concerns or symptoms today Strength training completed today  Goals Unmet:  Not Applicable  Comments: Pt able to follow exercise prescription today without complaint.  Will continue to monitor for progression.

## 2023-12-27 ENCOUNTER — Encounter (HOSPITAL_COMMUNITY)

## 2023-12-29 ENCOUNTER — Telehealth: Payer: Self-pay | Admitting: Internal Medicine

## 2023-12-29 ENCOUNTER — Encounter (HOSPITAL_COMMUNITY)
Admission: RE | Admit: 2023-12-29 | Discharge: 2023-12-29 | Disposition: A | Discharge status: Home / Self Care | Source: Ambulatory Visit | Attending: Internal Medicine | Admitting: Internal Medicine

## 2023-12-29 MED ORDER — CLOPIDOGREL BISULFATE 75 MG PO TABS: 75.0000 mg | ORAL_TABLET | Freq: Every day | ORAL | 3 refills | Status: AC

## 2023-12-29 NOTE — Progress Notes (Signed)
 Discharge Progress Report  Patient Details  Name: Thomas Summers MRN: [id number] Date of Birth: 01-31-1956 Referring Provider:   Flowsheet Row CARDIAC REHAB PHASE II ORIENTATION from 11/14/2023 in [facility] CARDIAC REHABILITATION  Referring Provider Stacia Diannah Liv MD     Number of Visits: 8  Reason for Discharge:  Early Exit:  Personal Pt states he has too much going on with his health at the moment and lots of tests coming up. Will get a new referral after all this.  Smoking History:  Social History   Tobacco Use  Smoking Status Never  Smokeless Tobacco Never    Diagnosis:  No diagnosis found.  ADL [facility]:   Initial Exercise Prescription:  Initial Exercise Prescription - 11/14/23 1000       Date of Initial Exercise RX and Referring Provider   Date 11/14/23    Referring Provider Mallipeddi, Diannah Liv MD      Oxygen   Maintain Oxygen Saturation 88% or higher      Treadmill   MPH 2.3    Grade 1    Minutes 15    METs 3.08      REL-XR   Level 3    Speed 50    Minutes 15    METs 3      Prescription Details   Frequency (times per week) 3    Duration Progress to 30 minutes of continuous aerobic without signs/symptoms of physical distress      Intensity   THRR 40-80% of Max Heartrate 112-139    Ratings of Perceived Exertion 11-13    Perceived Dyspnea 0-4      Progression   Progression Continue to progress workloads to maintain intensity without signs/symptoms of physical distress.      Resistance Training   Training Prescription Yes    Weight 5 lb    Reps 10-15          Discharge Exercise Prescription (Final Exercise Prescription Changes):  Exercise Prescription Changes - 11/17/23 0800       Response to Exercise   Blood Pressure (Admit) 126/72    Blood Pressure (Exercise) 150/72    Blood Pressure (Exit) 136/70    Heart Rate (Admit) 90 bpm    Heart Rate (Exercise) 124 bpm    Heart Rate (Exit) 105 bpm    Rating of Perceived  Exertion (Exercise) 12    Duration Continue with 30 min of aerobic exercise without signs/symptoms of physical distress.    Intensity THRR unchanged      Progression   Progression Continue to progress workloads to maintain intensity without signs/symptoms of physical distress.      Resistance Training   Training Prescription Yes    Weight 5    Reps 10-15      Treadmill   MPH 1.3    Grade 0    Minutes 15    METs 1.99      REL-XR   Level 4    Speed 56    Minutes 15    METs 4.2          Functional Capacity:  6 Minute Walk     Row Name 11/14/23 0959         6 Minute Walk   Phase Initial     Distance 1245 feet     Walk Time 6 minutes     # of Rest Breaks 0     MPH 2.36     METS 3.49  RPE 13     VO2 Peak 12.23     Symptoms Yes (comment)     Comments dizzy/lightheaded from increased blood pressure, relieved as pressure returned to normal     Resting HR 86 bpm     Resting BP 144/62     Resting Oxygen Saturation  98 %     Exercise Oxygen Saturation  during 6 min walk 98 %     Max Ex. HR 102 bpm     Max Ex. BP 170/74     2 Minute Post BP 138/60        Psychological, QOL, Others - Outcomes: PHQ 2/9:    11/14/2023    8:44 AM 06/01/2020   10:25 AM 03/13/2020    9:01 AM 04/12/2017   10:40 AM 07/12/2016    8:53 AM  Depression screen PHQ 2/9  Decreased Interest 0 0 0 0 0  Down, Depressed, Hopeless 0 0 0 0 0  PHQ - 2 Score 0 0 0 0 0  Altered sleeping 0      Tired, decreased energy 3      Change in appetite 0      Feeling bad or failure about yourself  0      Trouble concentrating 0      Moving slowly or fidgety/restless 1      Suicidal thoughts 0      PHQ-9 Score 4      Difficult doing work/chores Not difficult at all        Quality of Life:  Quality of Life - 11/10/23 1114       Quality of Life   Select Quality of Life      Quality of Life Scores   Health/Function Pre 21.43 %    Socioeconomic Pre 21.69 %    Psych/Spiritual Pre 20.64 %    Family  Pre 24.7 %    GLOBAL Pre 21.18 %          Personal Goals: Goals established at orientation with interventions provided to work toward goal.  Personal Goals and Risk Factors at Admission - 11/14/23 1013       Core Components/Risk Factors/Patient Goals on Admission    Weight Management Yes;Weight Loss;Weight Maintenance    Intervention Weight Management: Develop a combined nutrition and exercise program designed to reach desired caloric intake, while maintaining appropriate intake of nutrient and fiber, sodium and fats, and appropriate energy expenditure required for the weight goal.;Weight Management: Provide education and appropriate resources to help participant work on and attain dietary goals.;Weight Management/Obesity: Establish reasonable short term and long term weight goals.    Admit Weight 196 lb 12.8 oz (89.3 kg)    Goal Weight: Short Term 191 lb (86.6 kg)    Goal Weight: Long Term 190 lb (86.2 kg)    Expected Outcomes Short Term: Continue to assess and modify interventions until short term weight is achieved;Long Term: Adherence to nutrition and physical activity/exercise program aimed toward attainment of established weight goal;Weight Loss: Understanding of general recommendations for a balanced deficit meal plan, which promotes 1-2 lb weight loss per week and includes a negative energy balance of [phone] kcal/d;Understanding recommendations for meals to include 15-35% energy as protein, 25-35% energy from fat, 35-60% energy from carbohydrates, less than 200mg  of dietary cholesterol, 20-35 gm of total fiber daily;Understanding of distribution of calorie intake throughout the day with the consumption of 4-5 meals/snacks;Weight Maintenance: Understanding of the daily nutrition guidelines, which includes 25-35% calories from fat, 7%  or less cal from saturated fats, less than 200mg  cholesterol, less than 1.5gm of sodium, & 5 or more servings of fruits and vegetables daily    Diabetes Yes     Intervention Provide education about signs/symptoms and action to take for hypo/hyperglycemia.;Provide education about proper nutrition, including hydration, and aerobic/resistive exercise prescription along with prescribed medications to achieve blood glucose in normal ranges: Fasting glucose 65-99 mg/dL    Expected Outcomes Short Term: Participant verbalizes understanding of the signs/symptoms and immediate care of hyper/hypoglycemia, proper foot care and importance of medication, aerobic/resistive exercise and nutrition plan for blood glucose control.;Long Term: Attainment of HbA1C < 7%.    Heart Failure Yes    Intervention Provide a combined exercise and nutrition program that is supplemented with education, support and counseling about heart failure. Directed toward relieving symptoms such as shortness of breath, decreased exercise tolerance, and extremity edema.    Expected Outcomes Improve functional capacity of life;Short term: Attendance in program 2-3 days a week with increased exercise capacity. Reported lower sodium intake. Reported increased fruit and vegetable intake. Reports medication compliance.;Short term: Daily weights obtained and reported for increase. Utilizing diuretic protocols set by physician.;Long term: Adoption of self-care skills and reduction of barriers for early signs and symptoms recognition and intervention leading to self-care maintenance.    Hypertension Yes    Intervention Provide education on lifestyle modifcations including regular physical activity/exercise, weight management, moderate sodium restriction and increased consumption of fresh fruit, vegetables, and low fat dairy, alcohol moderation, and smoking cessation.;Monitor prescription use compliance.    Expected Outcomes Short Term: Continued assessment and intervention until BP is < 140/45mm HG in hypertensive participants. < 130/51mm HG in hypertensive participants with diabetes, heart failure or chronic kidney  disease.;Long Term: Maintenance of blood pressure at goal levels.    Lipids Yes    Intervention Provide education and support for participant on nutrition & aerobic/resistive exercise along with prescribed medications to achieve LDL 70mg , HDL >40mg .    Expected Outcomes Short Term: Participant states understanding of desired cholesterol values and is compliant with medications prescribed. Participant is following exercise prescription and nutrition guidelines.;Long Term: Cholesterol controlled with medications as prescribed, with individualized exercise RX and with personalized nutrition plan. Value goals: LDL < 70mg , HDL > 40 mg.           Personal Goals Discharge:   Exercise Goals and Review:  Exercise Goals     Row Name 11/14/23 1012             Exercise Goals   Increase Physical Activity Yes       Intervention Provide advice, education, support and counseling about physical activity/exercise needs.;Develop an individualized exercise prescription for aerobic and resistive training based on initial evaluation findings, risk stratification, comorbidities and participant's personal goals.       Expected Outcomes Short Term: Attend rehab on a regular basis to increase amount of physical activity.;Long Term: Exercising regularly at least 3-5 days a week.;Long Term: Add in home exercise to make exercise part of routine and to increase amount of physical activity.       Increase Strength and Stamina Yes       Intervention Provide advice, education, support and counseling about physical activity/exercise needs.;Develop an individualized exercise prescription for aerobic and resistive training based on initial evaluation findings, risk stratification, comorbidities and participant's personal goals.       Expected Outcomes Short Term: Increase workloads from initial exercise prescription for resistance, speed, and METs.;Short Term: Perform  resistance training exercises routinely during rehab and  add in resistance training at home;Long Term: Improve cardiorespiratory fitness, muscular endurance and strength as measured by increased METs and functional capacity ( )       Able to understand and use rate of perceived exertion (RPE) scale Yes       Intervention Provide education and explanation on how to use RPE scale       Expected Outcomes Short Term: Able to use RPE daily in rehab to express subjective intensity level;Long Term:  Able to use RPE to guide intensity level when exercising independently       Able to understand and use Dyspnea scale Yes       Intervention Provide education and explanation on how to use Dyspnea scale       Expected Outcomes Short Term: Able to use Dyspnea scale daily in rehab to express subjective sense of shortness of breath during exertion;Long Term: Able to use Dyspnea scale to guide intensity level when exercising independently       Knowledge and understanding of Target Heart Rate Range (THRR) Yes       Intervention Provide education and explanation of THRR including how the numbers were predicted and where they are located for reference       Expected Outcomes Short Term: Able to state/look up THRR;Long Term: Able to use THRR to govern intensity when exercising independently;Short Term: Able to use daily as guideline for intensity in rehab       Able to check pulse independently Yes       Intervention Provide education and demonstration on how to check pulse in carotid and radial arteries.;Review the importance of being able to check your own pulse for safety during independent exercise       Expected Outcomes Short Term: Able to explain why pulse checking is important during independent exercise;Long Term: Able to check pulse independently and accurately       Understanding of Exercise Prescription Yes       Intervention Provide education, explanation, and written materials on patient's individual exercise prescription       Expected Outcomes Short Term:  Able to explain program exercise prescription;Long Term: Able to explain home exercise prescription to exercise independently          Exercise Goals Re-Evaluation:  Exercise Goals Re-Evaluation     Row Name 11/15/23 1044             Exercise Goal Re-Evaluation   Exercise Goals Review Able to understand and use rate of perceived exertion (RPE) scale;Knowledge and understanding of Target Heart Rate Range (THRR)       Comments Reviewed RPE and dyspnea scale, THR and program prescription with pt today.  Pt voiced understanding and was given a copy of goals to take home.       Expected Outcomes Short: Use RPE daily to regulate intensity.  Long: Follow program prescription in THR.          Nutrition & Weight - Outcomes:  Pre Biometrics - 11/14/23 1012       Pre Biometrics   Height 6' 2 (1.88 m)    Weight 89.3 kg    Waist Circumference 42 inches    Hip Circumference 36 inches    Waist to Hip Ratio 1.17 %    BMI (Calculated) 25.26    Grip Strength 27.4 kg    Single Leg Stand 7.2 seconds           Nutrition:  Nutrition Therapy & Goals - 11/14/23 1013       Intervention Plan   Intervention Prescribe, educate and counsel regarding individualized specific dietary modifications aiming towards targeted core components such as weight, hypertension, lipid management, diabetes, heart failure and other comorbidities.;Nutrition handout(s) given to patient.    Expected Outcomes Short Term Goal: Understand basic principles of dietary content, such as calories, fat, sodium, cholesterol and nutrients.;Long Term Goal: Adherence to prescribed nutrition plan.          Nutrition Discharge:   Education Questionnaire Score:  Knowledge Questionnaire Score - 11/10/23 1114       Knowledge Questionnaire Score   Pre Score 26/26          Goals reviewed with patient; copy given to patient.

## 2023-12-29 NOTE — Progress Notes (Signed)
 Cardiac Individual Treatment Plan  Patient Details  Name: Thomas Summers MRN: [id number] Date of Birth: Feb 27, 1956 Referring Provider:   Flowsheet Row CARDIAC REHAB PHASE II ORIENTATION from 11/14/2023 in [facility] CARDIAC REHABILITATION  Referring Provider Stacia Diannah Liv MD    Initial Encounter Date:  Flowsheet Row CARDIAC REHAB PHASE II ORIENTATION from 11/14/2023 in [place]  Date 11/14/23    Visit Diagnosis: No diagnosis found.  Patient's Home Medications on Admission:  Current Outpatient Medications:    aspirin  EC 81 MG tablet, Take 1 tablet (81 mg total) by mouth daily. Swallow whole., Disp: 30 tablet, Rfl: 5   atorvastatin  (LIPITOR) 40 MG tablet, Take 1 tablet (40 mg total) by mouth daily., Disp: 90 tablet, Rfl: 3   blood glucose meter kit and supplies KIT, Dispense based on patient and insurance preference. ( One touch ) diabetes type 2. E11.9. Test blood sugar once a day, Disp: 1 each, Rfl: 5   clopidogrel  (PLAVIX ) 75 MG tablet, Take 1 tablet (75 mg total) by mouth daily with breakfast., Disp: 90 tablet, Rfl: 3   fluticasone  (FLONASE ) 50 MCG/ACT nasal spray, Place 1 spray into both nostrils daily as needed for allergies or rhinitis., Disp: , Rfl:    glucose blood test strip, Use 1x per day as directed., Disp: 100 each, Rfl: 3   hydrOXYzine  (ATARAX ) 25 MG tablet, Take one every 12 hours for anxiety or  sleep, Disp: 20 tablet, Rfl: 1   losartan  (COZAAR ) 25 MG tablet, Take 1 tablet (25 mg total) by mouth 2 (two) times daily., Disp: 60 tablet, Rfl: 3   metFORMIN (GLUCOPHAGE) 500 MG tablet, Take 500 mg by mouth daily., Disp: , Rfl:    nitroGLYCERIN  (NITROSTAT ) 0.4 MG SL tablet, Place 1 tablet (0.4 mg total) under the tongue every 5 (five) minutes as needed for chest pain., Disp: 25 tablet, Rfl: 3   OneTouch Delica Lancets 33G MISC, USE ONE  TO CHECK GLUCOSE ONCE DAILY, Disp: 100 each, Rfl: 1   silodosin  (RAPAFLO ) 8 MG CAPS capsule, TAKE 1  CAPSULE BY MOUTH ONCE DAILY WITH BREAKFAST, Disp: 30 capsule, Rfl: 0  Past Medical History: Past Medical History:  Diagnosis Date   Allergy    Depression    Diabetes mellitus without complication (HCC)    Heart murmur    Hyperlipidemia    Hypertension     Tobacco Use: Social History   Tobacco Use  Smoking Status Never  Smokeless Tobacco Never    Labs: Review Flowsheet  More data exists      Latest Ref Rng & Units 05/08/2019 12/18/2019 03/02/2020 11/10/2020 10/12/2023  Labs for ITP Cardiac and Pulmonary Rehab  Cholestrol 0 - 200 mg/dL 801  871  - 850  890   LDL (calc) 0 - 99 mg/dL 873  60  - 71  47   HDL-C >40 mg/dL 54  53  - 63  49   Trlycerides <150 mg/dL 898  78  - 77  66   Hemoglobin A1c 4.8 - 5.6 % 6.6  6.5  5.7  5.9  5.6      Exercise Target Goals: Exercise Program Goal: Individual exercise prescription set using results from initial 6 min walk test and THRR while considering  patient's activity barriers and safety.   Exercise Prescription Goal: Initial exercise prescription builds to 30-45 minutes a day of aerobic activity, 2-3 days per week.  Home exercise guidelines will be given to patient during program as part of exercise  prescription that the participant will acknowledge.   Education: Aerobic Exercise: - Group verbal and visual presentation on the components of exercise prescription. Introduces F.I.T.T principle from ACSM for exercise prescriptions.  Reviews F.I.T.T. principles of aerobic exercise including progression. Written material provided at class time.   Education: Resistance Exercise: - Group verbal and visual presentation on the components of exercise prescription. Introduces F.I.T.T principle from ACSM for exercise prescriptions  Reviews F.I.T.T. principles of resistance exercise including progression. Written material provided at class time.    Education: Exercise & Equipment Safety: - Individual verbal instruction and demonstration of equipment  use and safety with use of the equipment.   Education: Exercise Physiology & General Exercise Guidelines: - Group verbal and written instruction with models to review the exercise physiology of the cardiovascular system and associated critical values. Provides general exercise guidelines with specific guidelines to those with heart or lung disease. Written material provided at class time. Flowsheet Row CARDIAC REHAB PHASE II EXERCISE from 11/22/2023 in [place]  Date 11/15/23  Educator jh  Instruction Review Code 2- Demonstrated Understanding    Education: Flexibility, Balance, Mind/Body Relaxation: - Group verbal and visual presentation with interactive activity on the components of exercise prescription. Introduces F.I.T.T principle from ACSM for exercise prescriptions. Reviews F.I.T.T. principles of flexibility and balance exercise training including progression. Also discusses the mind body connection.  Reviews various relaxation techniques to help reduce and manage stress (i.e. Deep breathing, progressive muscle relaxation, and visualization). Balance handout provided to take home. Written material provided at class time.   Activity Barriers & Risk Stratification:  Activity Barriers & Cardiac Risk Stratification - 11/14/23 1008       Activity Barriers & Cardiac Risk Stratification   Activity Barriers Balance Concerns;Deconditioning;Muscular Weakness;Decreased Ventricular Function;Other (comment)    Comments dizziness/lightheaded as blood pressure increases    Cardiac Risk Stratification Moderate          6 Minute Walk:  6 Minute Walk     Row Name 11/14/23 0959         6 Minute Walk   Phase Initial     Distance 1245 feet     Walk Time 6 minutes     # of Rest Breaks 0     MPH 2.36     METS 3.49     RPE 13     VO2 Peak 12.23     Symptoms Yes (comment)     Comments dizzy/lightheaded from increased blood pressure, relieved as pressure returned to  normal     Resting HR 86 bpm     Resting BP 144/62     Resting Oxygen Saturation  98 %     Exercise Oxygen Saturation  during 6 min walk 98 %     Max Ex. HR 102 bpm     Max Ex. BP 170/74     2 Minute Post BP 138/60        Oxygen Initial Assessment:   Oxygen Re-Evaluation:   Oxygen Discharge (Final Oxygen Re-Evaluation):   Initial Exercise Prescription:  Initial Exercise Prescription - 11/14/23 1000       Date of Initial Exercise RX and Referring Provider   Date 11/14/23    Referring Provider Mallipeddi, Vishnu Pyria MD      Oxygen   Maintain Oxygen Saturation 88% or higher      Treadmill   MPH 2.3    Grade 1    Minutes 15    METs 3.08  REL-XR   Level 3    Speed 50    Minutes 15    METs 3      Prescription Details   Frequency (times per week) 3    Duration Progress to 30 minutes of continuous aerobic without signs/symptoms of physical distress      Intensity   THRR 40-80% of Max Heartrate 112-139    Ratings of Perceived Exertion 11-13    Perceived Dyspnea 0-4      Progression   Progression Continue to progress workloads to maintain intensity without signs/symptoms of physical distress.      Resistance Training   Training Prescription Yes    Weight 5 lb    Reps 10-15          Perform Capillary Blood Glucose checks as needed.  Exercise Prescription Changes:   Exercise Prescription Changes     Row Name 11/14/23 1000 11/17/23 0800           Response to Exercise   Blood Pressure (Admit) 144/62 126/72      Blood Pressure (Exercise) 170/74 150/72      Blood Pressure (Exit) 138/60 136/70      Heart Rate (Admit) 86 bpm 90 bpm      Heart Rate (Exercise) 102 bpm 124 bpm      Heart Rate (Exit) 102 bpm 105 bpm      Oxygen Saturation (Admit) 98 % --      Oxygen Saturation (Exercise) 98 % --      Rating of Perceived Exertion (Exercise) 13 12      Symptoms dizzy/lightheaded from high BP --      Comments walk test results --      Duration --  Continue with 30 min of aerobic exercise without signs/symptoms of physical distress.      Intensity -- THRR unchanged        Progression   Progression -- Continue to progress workloads to maintain intensity without signs/symptoms of physical distress.        Resistance Training   Training Prescription -- Yes      Weight -- 5      Reps -- 10-15        Treadmill   MPH -- 1.3      Grade -- 0      Minutes -- 15      METs -- 1.99        REL-XR   Level -- 4      Speed -- 56      Minutes -- 15      METs -- 4.2         Exercise Comments:   Exercise Comments     Row Name 11/15/23 1043           Exercise Comments First full day of exercise!  Patient was oriented to gym and equipment including functions, settings, policies, and procedures.  Patient's individual exercise prescription and treatment plan were reviewed.  All starting workloads were established based on the results of the 6 minute walk test done at initial orientation visit.  The plan for exercise progression was also introduced and progression will be customized based on patient's performance and goals.          Exercise Goals and Review:   Exercise Goals     Row Name 11/14/23 1012             Exercise Goals   Increase Physical Activity Yes  Intervention Provide advice, education, support and counseling about physical activity/exercise needs.;Develop an individualized exercise prescription for aerobic and resistive training based on initial evaluation findings, risk stratification, comorbidities and participant's personal goals.       Expected Outcomes Short Term: Attend rehab on a regular basis to increase amount of physical activity.;Long Term: Exercising regularly at least 3-5 days a week.;Long Term: Add in home exercise to make exercise part of routine and to increase amount of physical activity.       Increase Strength and Stamina Yes       Intervention Provide advice, education, support and  counseling about physical activity/exercise needs.;Develop an individualized exercise prescription for aerobic and resistive training based on initial evaluation findings, risk stratification, comorbidities and participant's personal goals.       Expected Outcomes Short Term: Increase workloads from initial exercise prescription for resistance, speed, and METs.;Short Term: Perform resistance training exercises routinely during rehab and add in resistance training at home;Long Term: Improve cardiorespiratory fitness, muscular endurance and strength as measured by increased METs and functional capacity ( )       Able to understand and use rate of perceived exertion (RPE) scale Yes       Intervention Provide education and explanation on how to use RPE scale       Expected Outcomes Short Term: Able to use RPE daily in rehab to express subjective intensity level;Long Term:  Able to use RPE to guide intensity level when exercising independently       Able to understand and use Dyspnea scale Yes       Intervention Provide education and explanation on how to use Dyspnea scale       Expected Outcomes Short Term: Able to use Dyspnea scale daily in rehab to express subjective sense of shortness of breath during exertion;Long Term: Able to use Dyspnea scale to guide intensity level when exercising independently       Knowledge and understanding of Target Heart Rate Range (THRR) Yes       Intervention Provide education and explanation of THRR including how the numbers were predicted and where they are located for reference       Expected Outcomes Short Term: Able to state/look up THRR;Long Term: Able to use THRR to govern intensity when exercising independently;Short Term: Able to use daily as guideline for intensity in rehab       Able to check pulse independently Yes       Intervention Provide education and demonstration on how to check pulse in carotid and radial arteries.;Review the importance of being able to  check your own pulse for safety during independent exercise       Expected Outcomes Short Term: Able to explain why pulse checking is important during independent exercise;Long Term: Able to check pulse independently and accurately       Understanding of Exercise Prescription Yes       Intervention Provide education, explanation, and written materials on patient's individual exercise prescription       Expected Outcomes Short Term: Able to explain program exercise prescription;Long Term: Able to explain home exercise prescription to exercise independently          Exercise Goals Re-Evaluation :  Exercise Goals Re-Evaluation     Row Name 11/15/23 1044             Exercise Goal Re-Evaluation   Exercise Goals Review Able to understand and use rate of perceived exertion (RPE) scale;Knowledge and understanding of Target Heart Rate  Range (THRR)       Comments Reviewed RPE and dyspnea scale, THR and program prescription with pt today.  Pt voiced understanding and was given a copy of goals to take home.       Expected Outcomes Short: Use RPE daily to regulate intensity.  Long: Follow program prescription in THR.          Discharge Exercise Prescription (Final Exercise Prescription Changes):  Exercise Prescription Changes - 11/17/23 0800       Response to Exercise   Blood Pressure (Admit) 126/72    Blood Pressure (Exercise) 150/72    Blood Pressure (Exit) 136/70    Heart Rate (Admit) 90 bpm    Heart Rate (Exercise) 124 bpm    Heart Rate (Exit) 105 bpm    Rating of Perceived Exertion (Exercise) 12    Duration Continue with 30 min of aerobic exercise without signs/symptoms of physical distress.    Intensity THRR unchanged      Progression   Progression Continue to progress workloads to maintain intensity without signs/symptoms of physical distress.      Resistance Training   Training Prescription Yes    Weight 5    Reps 10-15      Treadmill   MPH 1.3    Grade 0    Minutes 15     METs 1.99      REL-XR   Level 4    Speed 56    Minutes 15    METs 4.2          Nutrition:  Target Goals: Understanding of nutrition guidelines, daily intake of sodium 1500mg , cholesterol 200mg , calories 30% from fat and 7% or less from saturated fats, daily to have 5 or more servings of fruits and vegetables.  Education: Nutrition 1 -Group instruction provided by verbal, written material, interactive activities, discussions, models, and posters to present general guidelines for heart healthy nutrition including macronutrients, label reading, and promoting whole foods over processed counterparts. Education serves as Pensions consultant of discussion of heart healthy eating for all. Written material provided at class time.    Education: Nutrition 2 -Group instruction provided by verbal, written material, interactive activities, discussions, models, and posters to present general guidelines for heart healthy nutrition including sodium, cholesterol, and saturated fat. Providing guidance of habit forming to improve blood pressure, cholesterol, and body weight. Written material provided at class time.     Biometrics:  Pre Biometrics - 11/14/23 1012       Pre Biometrics   Height 6' 2 (1.88 m)    Weight 89.3 kg    Waist Circumference 42 inches    Hip Circumference 36 inches    Waist to Hip Ratio 1.17 %    BMI (Calculated) 25.26    Grip Strength 27.4 kg    Single Leg Stand 7.2 seconds           Nutrition Therapy Plan and Nutrition Goals:  Nutrition Therapy & Goals - 11/14/23 1013       Intervention Plan   Intervention Prescribe, educate and counsel regarding individualized specific dietary modifications aiming towards targeted core components such as weight, hypertension, lipid management, diabetes, heart failure and other comorbidities.;Nutrition handout(s) given to patient.    Expected Outcomes Short Term Goal: Understand basic principles of dietary content, such as calories,  fat, sodium, cholesterol and nutrients.;Long Term Goal: Adherence to prescribed nutrition plan.          Nutrition Assessments:  MEDIFICTS Score Key: >=70 Need to make  dietary changes  40-70 Heart Healthy Diet <= 40 Therapeutic Level Cholesterol Diet  Flowsheet Row CARDIAC VIRTUAL BASED CARE from 11/10/2023 in [facility] CARDIAC RE[facility]AB[facility]ITATION  Picture Your Plate Total Score on Admission 85   Picture Your Plate Scores: <59 Unhealthy dietary pattern with much room for improvement. 41-50 Dietary pattern unlikely to meet recommendations for good health and room for improvement. 51-60 More healthful dietary pattern, with some room for improvement.  >60 Healthy dietary pattern, although there may be some specific behaviors that could be improved.    Nutrition Goals Re-Evaluation:   Nutrition Goals Discharge (Final Nutrition Goals Re-Evaluation):   Psychosocial: Target Goals: Acknowledge presence or absence of significant depression and/or stress, maximize coping skills, provide positive support system. Participant is able to verbalize types and ability to use techniques and skills needed for reducing stress and depression.   Education: Stress, Anxiety, and Depression - Group verbal and visual presentation to define topics covered.  Reviews how body is impacted by stress, anxiety, and depression.  Also discusses healthy ways to reduce stress and to treat/manage anxiety and depression. Written material provided at class time.   Education: Sleep Hygiene -Provides group verbal and written instruction about how sleep can affect your health.  Define sleep hygiene, discuss sleep cycles and impact of sleep habits. Review good sleep hygiene tips.   Initial Review & Psychosocial Screening:  Initial Psych Review & Screening - 11/10/23 0900       Initial Review   Current issues with Current Sleep Concerns;Current Anxiety/Panic      Family Dynamics   Good Support System? Yes       Barriers   Psychosocial barriers to participate in program The patient should benefit from training in stress management and relaxation.;There are no identifiable barriers or psychosocial needs.      Screening Interventions   Interventions Encouraged to exercise;To provide support and resources with identified psychosocial needs;Provide feedback about the scores to participant    Expected Outcomes Short Term goal: Utilizing psychosocial counselor, staff and physician to assist with identification of specific Stressors or current issues interfering with healing process. Setting desired goal for each stressor or current issue identified.;Long Term Goal: Stressors or current issues are controlled or eliminated.;Short Term goal: Identification and review with participant of any Quality of Life or Depression concerns found by scoring the questionnaire.;Long Term goal: The participant improves quality of Life and PHQ9 Scores as seen by post scores and/or verbalization of changes          Quality of Life Scores:   Quality of Life - 11/10/23 1114       Quality of Life   Select Quality of Life      Quality of Life Scores   Health/Function Pre 21.43 %    Socioeconomic Pre 21.69 %    Psych/Spiritual Pre 20.64 %    Family Pre 24.7 %    GLOBAL Pre 21.18 %         Scores of 19 and below usually indicate a poorer quality of life in these areas.  A difference of  2-3 points is a clinically meaningful difference.  A difference of 2-3 points in the total score of the Quality of Life Index has been associated with significant improvement in overall quality of life, self-image, physical symptoms, and general health in studies assessing change in quality of life.  PHQ-9: Review Flowsheet  More data may exist      11/14/2023 06/01/2020 03/13/2020 04/12/2017 07/12/2016  Depression screen  PHQ 2/9  Decreased Interest 0 0 0 0 0  Down, Depressed, Hopeless 0 0 0 0 0  PHQ - 2 Score 0 0 0 0 0  Altered sleeping 0  - - - -  Tired, decreased energy 3 - - - -  Change in appetite 0 - - - -  Feeling bad or failure about yourself  0 - - - -  Trouble concentrating 0 - - - -  Moving slowly or fidgety/restless 1 - - - -  Suicidal thoughts 0 - - - -  PHQ-9 Score 4 - - - -  Difficult doing work/chores Not difficult at all - - - -   Interpretation of Total Score  Total Score Depression Severity:  1-4 = Minimal depression, 5-9 = Mild depression, 10-14 = Moderate depression, 15-19 = Moderately severe depression, 20-27 = Severe depression   Psychosocial Evaluation and Intervention:  Psychosocial Evaluation - 11/10/23 0900       Psychosocial Evaluation & Interventions   Interventions Stress management education;Relaxation education;Encouraged to exercise with the program and follow exercise prescription    Comments Patient was referred to [facility] with stent placement. He denies any depression but has had some anxiety since his stent due to chronic dizziness and elevated B/P. He has been treated in the ED for this since his stent. His symptoms are thought to be caused by PVC burden. Dr. Mallipeddi adjusted his medications 8/6 and put him on a 10 day monitor. He says his dizziness has increased since the med change and plans to call her office today. He was also started on Hydroxyzine  for the anxiety but he says he has not used yet. He says he does not sleep well having trouble staying asleep. He is waiting on a sleep study. He lives with his wife who is his main support along with his chruch family and friends. His goals for the program are to increase his strength and continue to lose weight. He started a weight loss diet in March and has lost 25 lbs. He has no barriers identified to complete the program but is concerned about being able to exercise due to the dizziness.    Expected Outcomes Short Term: Patient will start the program and attend consistently. Long Term: Patient will complete the program meeting personal goals.     Continue Psychosocial Services  Follow up required by staff          Psychosocial Re-Evaluation:   Psychosocial Discharge (Final Psychosocial Re-Evaluation):   Vocational Rehabilitation: Provide vocational rehab assistance to qualifying candidates.   Vocational Rehab Evaluation & Intervention:  Vocational Rehab - 11/10/23 0859       Initial Vocational Rehab Evaluation & Intervention   Assessment shows need for Vocational Rehabilitation No      Vocational Rehab Re-Evaulation   Comments Patient is retired.          Education: Education Goals: Education classes will be provided on a variety of topics geared toward better understanding of heart health and risk factor modification. Participant will state understanding/return demonstration of topics presented as noted by education test scores.  Learning Barriers/Preferences:  Learning Barriers/Preferences - 11/14/23 0846       Learning Barriers/Preferences   Learning Barriers Hearing    Learning Preferences Written Material          General Cardiac Education Topics:  AED/CPR: - Group verbal and written instruction with the use of models to demonstrate the basic use of the AED with the basic ABC's  of resuscitation.   Test and Procedures: - Group verbal and visual presentation and models provide information about basic cardiac anatomy and function. Reviews the testing methods done to diagnose heart disease and the outcomes of the test results. Describes the treatment choices: Medical Management, Angioplasty, or Coronary Bypass Surgery for treating various heart conditions including Myocardial Infarction, Angina, Valve Disease, and Cardiac Arrhythmias. Written material provided at class time. Flowsheet Row CARDIAC REHAB PHASE II EXERCISE from 11/22/2023 in [place] CARDIAC [facility]ILIT[facility]  Date 11/22/23  Educator DJ  Instruction Review Code 1- Verbalizes Understanding    Medication Safety: - Group verbal and  visual instruction to review commonly prescribed medications for heart and lung disease. Reviews the medication, class of the drug, and side effects. Includes the steps to properly store meds and maintain the prescription regimen. Written material provided at class time.   Intimacy: - Group verbal instruction through game format to discuss how heart and lung disease can affect sexual intimacy. Written material provided at class time.   Know Your Numbers and Heart Failure: - Group verbal and visual instruction to discuss disease risk factors for cardiac and pulmonary disease and treatment options.  Reviews associated critical values for Overweight/Obesity, Hypertension, Cholesterol, and Diabetes.  Discusses basics of heart failure: signs/symptoms and treatments.  Introduces Heart Failure Zone chart for action plan for heart failure. Written material provided at class time.   Infection Prevention: - Provides verbal and written material to individual with discussion of infection control including proper hand washing and proper equipment cleaning during exercise session.   Falls Prevention: - Provides verbal and written material to individual with discussion of falls prevention and safety.   Other: -Provides group and verbal instruction on various topics (see comments)   Knowledge Questionnaire Score:  Knowledge Questionnaire Score - 11/10/23 1114       Knowledge Questionnaire Score   Pre Score 26/26          Core Components/Risk Factors/Patient Goals at Admission:  Personal Goals and Risk Factors at Admission - 11/14/23 1013       Core Components/Risk Factors/Patient Goals on Admission    Weight Management Yes;Weight Loss;Weight Maintenance    Intervention Weight Management: Develop a combined nutrition and exercise program designed to reach desired caloric intake, while maintaining appropriate intake of nutrient and fiber, sodium and fats, and appropriate energy expenditure  required for the weight goal.;Weight Management: Provide education and appropriate resources to help participant work on and attain dietary goals.;Weight Management/Obesity: Establish reasonable short term and long term weight goals.    Admit Weight 196 lb 12.8 oz (89.3 kg)    Goal Weight: Short Term 191 lb (86.6 kg)    Goal Weight: Long Term 190 lb (86.2 kg)    Expected Outcomes Short Term: Continue to assess and modify interventions until short term weight is achieved;Long Term: Adherence to nutrition and physical activity/exercise program aimed toward attainment of established weight goal;Weight Loss: Understanding of general recommendations for a balanced deficit meal plan, which promotes 1-2 lb weight loss per week and includes a negative energy balance of [phone] kcal/d;Understanding recommendations for meals to include 15-35% energy as protein, 25-35% energy from fat, 35-60% energy from carbohydrates, less than 200mg  of dietary cholesterol, 20-35 gm of total fiber daily;Understanding of distribution of calorie intake throughout the day with the consumption of 4-5 meals/snacks;Weight Maintenance: Understanding of the daily nutrition guidelines, which includes 25-35% calories from fat, 7% or less cal from saturated fats, less than 200mg  cholesterol, less than  1.5gm of sodium, & 5 or more servings of fruits and vegetables daily    Diabetes Yes    Intervention Provide education about signs/symptoms and action to take for hypo/hyperglycemia.;Provide education about proper nutrition, including hydration, and aerobic/resistive exercise prescription along with prescribed medications to achieve blood glucose in normal ranges: Fasting glucose 65-99 mg/dL    Expected Outcomes Short Term: Participant verbalizes understanding of the signs/symptoms and immediate care of hyper/hypoglycemia, proper foot care and importance of medication, aerobic/resistive exercise and nutrition plan for blood glucose control.;Long  Term: Attainment of HbA1C < 7%.    Heart Failure Yes    Intervention Provide a combined exercise and nutrition program that is supplemented with education, support and counseling about heart failure. Directed toward relieving symptoms such as shortness of breath, decreased exercise tolerance, and extremity edema.    Expected Outcomes Improve functional capacity of life;Short term: Attendance in program 2-3 days a week with increased exercise capacity. Reported lower sodium intake. Reported increased fruit and vegetable intake. Reports medication compliance.;Short term: Daily weights obtained and reported for increase. Utilizing diuretic protocols set by physician.;Long term: Adoption of self-care skills and reduction of barriers for early signs and symptoms recognition and intervention leading to self-care maintenance.    Hypertension Yes    Intervention Provide education on lifestyle modifcations including regular physical activity/exercise, weight management, moderate sodium restriction and increased consumption of fresh fruit, vegetables, and low fat dairy, alcohol moderation, and smoking cessation.;Monitor prescription use compliance.    Expected Outcomes Short Term: Continued assessment and intervention until BP is < 140/20mm HG in hypertensive participants. < 130/66mm HG in hypertensive participants with diabetes, heart failure or chronic kidney disease.;Long Term: Maintenance of blood pressure at goal levels.    Lipids Yes    Intervention Provide education and support for participant on nutrition & aerobic/resistive exercise along with prescribed medications to achieve LDL 70mg , HDL >40mg .    Expected Outcomes Short Term: Participant states understanding of desired cholesterol values and is compliant with medications prescribed. Participant is following exercise prescription and nutrition guidelines.;Long Term: Cholesterol controlled with medications as prescribed, with individualized exercise RX  and with personalized nutrition plan. Value goals: LDL < 70mg , HDL > 40 mg.          Education:Diabetes - Individual verbal and written instruction to review signs/symptoms of diabetes, desired ranges of glucose level fasting, after meals and with exercise. Acknowledge that pre and post exercise glucose checks will be done for 3 sessions at entry of program.   Core Components/Risk Factors/Patient Goals Review:    Core Components/Risk Factors/Patient Goals at Discharge (Final Review):    ITP Comments:  ITP Comments     Row Name 11/10/23 0948 11/14/23 0959 11/15/23 1013 11/15/23 1043 12/05/23 0931   ITP Comments Virtual orientation visit completed for cardiac rehab with stent placement. On-site orientation visit scheduled for 11/14/23 at 0830. Patient attend orientation today.  Patient is attending Cardiac Rehabilitation Program.  Documentation for diagnosis can be found in [facility] 10/11/23.  Reviewed medical chart, RPE/RPD, gym safety, and program guidelines.  Patient was fitted to equipment they will be using during rehab.  Patient is scheduled to start exercise on Wednesday 11/15/23 at 1100.   Initial ITP created and sent for review and signature by Dr. Dorn Ross, Medical Director for Cardiac Rehabilitation Program. 30 day review completed. ITP sent to Dr. Dorn Ross, Medical Director of Cardiac Rehab. Continue with ITP unless changes are made by physician.  Only oriented yesterday. First full day  of exercise!  Patient was oriented to gym and equipment including functions, settings, policies, and procedures.  Patient's individual exercise prescription and treatment plan were reviewed.  All starting workloads were established based on the results of the 6 minute walk test done at initial orientation visit.  The plan for exercise progression was also introduced and progression will be customized based on patient's performance and goals. Pt called to say that he was still not feeling well.  He  has a CT scan scheduled for next week and they also want him to see an ENT.  He would like to be placed on medical hold until he is able to return and feel better.  We currently have him out until 12/25/23.    Row Name 12/13/23 1442           ITP Comments 30 day review completed. ITP sent to Dr. Dorn Ross, Medical Director of Cardiac Rehab. Continue with ITP unless changes are made by physician.  Still newer to program, but currently out on medical hold.          Comments: Discharge ITP. Pt states he has too much going on with health, will get a new referral.

## 2023-12-29 NOTE — Telephone Encounter (Signed)
     1. Which medications need to be refilled? (please list name of each medication and dose if known) Plavix     2. Would you like to learn more about the convenience, safety, & potential cost savings by using the [facility]? Not sure   3. Are you open to using the [facility] (Type [facility]. doubtful   4. Which pharmacy/location (including street and city if local pharmacy) is medication to be sent to?[facility] [place]    5. Do they need a 30 day or 90 day supply? 30

## 2023-12-29 NOTE — Telephone Encounter (Signed)
 Rx sent to pharmacy

## 2024-01-01 ENCOUNTER — Encounter (HOSPITAL_COMMUNITY)

## 2024-01-03 ENCOUNTER — Encounter (HOSPITAL_COMMUNITY)

## 2024-01-04 ENCOUNTER — Ambulatory Visit (HOSPITAL_BASED_OUTPATIENT_CLINIC_OR_DEPARTMENT_OTHER): Admitting: Cardiology

## 2024-01-05 ENCOUNTER — Encounter (HOSPITAL_COMMUNITY)

## 2024-01-08 ENCOUNTER — Encounter (HOSPITAL_COMMUNITY)

## 2024-01-10 ENCOUNTER — Telehealth: Payer: Self-pay | Admitting: Internal Medicine

## 2024-01-10 ENCOUNTER — Encounter (HOSPITAL_COMMUNITY)

## 2024-01-10 DIAGNOSIS — R42 Dizziness and giddiness: Secondary | ICD-10-CM

## 2024-01-10 NOTE — Telephone Encounter (Signed)
 I will forward to MD for review.

## 2024-01-10 NOTE — Telephone Encounter (Signed)
 Pt called in to let Dr. Stacia know that he saw and ENT and they do not think he has vertigo, he was advised to c/b here and discuss his medications.

## 2024-01-11 NOTE — Telephone Encounter (Signed)
 I spoke with patient and he says he symptoms since July and notes a total change in his quality of life. He is now unable to drive, do any activities he used to enjoy with his family. His best description is generalized weakness, intermittent shaking,fatigue.His symptoms are essentially every day, lasting all day.He checks his glucose daily and has normal readings. His BP at home runs systolic's of 130-160 He is willing to try any medication changes. He is at a loss for what to do next.

## 2024-01-12 ENCOUNTER — Encounter (HOSPITAL_COMMUNITY)

## 2024-01-12 NOTE — Telephone Encounter (Signed)
 Orders entered, patient made aware to expect call to schedule

## 2024-01-15 ENCOUNTER — Encounter (HOSPITAL_COMMUNITY)

## 2024-01-17 ENCOUNTER — Telehealth: Payer: Self-pay | Admitting: Internal Medicine

## 2024-01-17 ENCOUNTER — Encounter (HOSPITAL_COMMUNITY)

## 2024-01-17 NOTE — Telephone Encounter (Signed)
 Patient calling to say sense the stint he has gotten worse. Not really able to do anything without getting out of breath. Calling to see what the dr suggest. Please advise

## 2024-01-17 NOTE — Telephone Encounter (Signed)
 Carotid & UE arterial duplex scheduled for 01/30/2024.

## 2024-01-19 ENCOUNTER — Encounter (HOSPITAL_COMMUNITY)

## 2024-01-22 ENCOUNTER — Encounter (HOSPITAL_COMMUNITY)

## 2024-01-22 NOTE — Telephone Encounter (Signed)
 Patient notified via mychart

## 2024-01-22 NOTE — Telephone Encounter (Signed)
 Spoke with patient.  States he really does not feel SOB.  His main issue is the weakness & extreme fatigue. Says hard to explain the way his head feels, not so much as dizzy as it feels full.  Everything else seems to be fine - eating & drinking like normal.

## 2024-01-24 ENCOUNTER — Encounter (HOSPITAL_COMMUNITY)

## 2024-01-25 ENCOUNTER — Other Ambulatory Visit: Payer: Self-pay

## 2024-01-25 ENCOUNTER — Emergency Department (HOSPITAL_COMMUNITY)

## 2024-01-25 ENCOUNTER — Emergency Department (HOSPITAL_COMMUNITY)
Admission: EM | Admit: 2024-01-25 | Discharge: 2024-01-25 | Disposition: A | Discharge status: Home / Self Care | Source: Ambulatory Visit | Attending: Emergency Medicine | Admitting: Emergency Medicine

## 2024-01-25 ENCOUNTER — Encounter (HOSPITAL_COMMUNITY): Payer: Self-pay | Admitting: Emergency Medicine

## 2024-01-25 DIAGNOSIS — Z79899 Other long term (current) drug therapy: Secondary | ICD-10-CM | POA: Insufficient documentation

## 2024-01-25 DIAGNOSIS — R5383 Other fatigue: Secondary | ICD-10-CM | POA: Insufficient documentation

## 2024-01-25 DIAGNOSIS — I10 Essential (primary) hypertension: Secondary | ICD-10-CM | POA: Diagnosis not present

## 2024-01-25 DIAGNOSIS — I2510 Atherosclerotic heart disease of native coronary artery without angina pectoris: Secondary | ICD-10-CM | POA: Insufficient documentation

## 2024-01-25 DIAGNOSIS — E1165 Type 2 diabetes mellitus with hyperglycemia: Secondary | ICD-10-CM | POA: Diagnosis not present

## 2024-01-25 DIAGNOSIS — R079 Chest pain, unspecified: Secondary | ICD-10-CM | POA: Insufficient documentation

## 2024-01-25 DIAGNOSIS — J3489 Other specified disorders of nose and nasal sinuses: Secondary | ICD-10-CM | POA: Diagnosis not present

## 2024-01-25 DIAGNOSIS — R42 Dizziness and giddiness: Secondary | ICD-10-CM | POA: Diagnosis present

## 2024-01-25 DIAGNOSIS — R053 Chronic cough: Secondary | ICD-10-CM | POA: Insufficient documentation

## 2024-01-25 DIAGNOSIS — Z7984 Long term (current) use of oral hypoglycemic drugs: Secondary | ICD-10-CM | POA: Insufficient documentation

## 2024-01-25 DIAGNOSIS — R000 Tachycardia, unspecified: Secondary | ICD-10-CM | POA: Insufficient documentation

## 2024-01-25 DIAGNOSIS — Z7982 Long term (current) use of aspirin: Secondary | ICD-10-CM | POA: Diagnosis not present

## 2024-01-25 LAB — CBC
HCT: 45.1 % (ref 39.0–52.0)
Hemoglobin: 15.3 g/dL (ref 13.0–17.0)
MCH: 32.4 pg (ref 26.0–34.0)
MCHC: 33.9 g/dL (ref 30.0–36.0)
MCV: 95.6 fL (ref 80.0–100.0)
Platelets: 181 K/uL (ref 150–400)
RBC: 4.72 MIL/uL (ref 4.22–5.81)
RDW: 15.3 % (ref 11.5–15.5)
WBC: 10.1 K/uL (ref 4.0–10.5)
nRBC: 0 % (ref 0.0–0.2)

## 2024-01-25 LAB — COMPREHENSIVE METABOLIC PANEL WITH GFR
ALT: 30 U/L (ref 0–44)
AST: 22 U/L (ref 15–41)
Albumin: 4.2 g/dL (ref 3.5–5.0)
Alkaline Phosphatase: 84 U/L (ref 38–126)
Anion gap: 13 (ref 5–15)
BUN: 27 mg/dL — ABNORMAL HIGH (ref 8–23)
CO2: 22 mmol/L (ref 22–32)
Calcium: 9.3 mg/dL (ref 8.9–10.3)
Chloride: 102 mmol/L (ref 98–111)
Creatinine, Ser: 0.67 mg/dL (ref 0.61–1.24)
GFR, Estimated: >60 mL/min (ref >60)
Glucose, Bld: 103 mg/dL — ABNORMAL HIGH (ref 70–99)
Potassium: 4.3 mmol/L (ref 3.5–5.1)
Sodium: 138 mmol/L (ref 135–145)
Total Bilirubin: 0.5 mg/dL (ref 0.0–1.2)
Total Protein: 6.7 g/dL (ref 6.5–8.1)

## 2024-01-25 LAB — TROPONIN T, HIGH SENSITIVITY
Troponin T High Sensitivity: 17 ng/L (ref 0–19)
Troponin T High Sensitivity: 15 ng/L (ref 0–19)

## 2024-01-25 LAB — RESP PANEL BY RT-PCR (RSV, FLU A&B, COVID)  RVPGX2
Influenza A by PCR: NEGATIVE
Influenza B by PCR: NEGATIVE
Resp Syncytial Virus by PCR: NEGATIVE
SARS Coronavirus 2 by RT PCR: NEGATIVE

## 2024-01-25 LAB — MAGNESIUM: Magnesium: 2.3 mg/dL (ref 1.7–2.4)

## 2024-01-25 MED ORDER — IOHEXOL 350 MG/ML SOLN
75.0000 mL | Freq: Once | INTRAVENOUS | Status: AC | PRN
  Administered 2024-01-25: 75 mL via INTRAVENOUS

## 2024-01-25 MED ORDER — SODIUM CHLORIDE 0.9 % IV BOLUS
500.0000 mL | Freq: Once | INTRAVENOUS | Status: AC
  Administered 2024-01-25: 500 mL via INTRAVENOUS

## 2024-01-25 NOTE — ED Provider Notes (Signed)
 [place] EMERGENCY DEPARTMENT AT [facility] Provider Note   CSN: [id number] Arrival date & time: 01/25/24  1355     Patient presents with: Chest Pain   Thomas Summers is a 68 y.o. male with PMHx DM, HLD, HTN, CAD who presents to ED concerned for lightheadedness since July.   Ligthheadedness. Extremely fatigued.denies vertigo. Sympoms happening since July. Cardiologist wants carotid. Since July 9th. Thought maybe medications but these were switched. ENT already saw. Lightheadedness same whether standing or sitting. Couple intermittent twinges of chest pain recently. Sleep study scheduled for tomorrow. Slight congestion and cough that is chronic but slightly worsening recently.   Denies fever, dyspnea, nausea, vomiting, diarrhea, dysuria, hematuria, hematochezia.   {Add pertinent medical, surgical, social history, OB history to HPI:32947}  Chest Pain      Prior to Admission medications   Medication Sig Start Date End Date Taking? Authorizing Provider  aspirin  EC 81 MG tablet Take 1 tablet (81 mg total) by mouth daily. Swallow whole. 10/04/23   Mallipeddi, Vishnu P, MD  atorvastatin  (LIPITOR) 40 MG tablet Take 1 tablet (40 mg total) by mouth daily. 12/25/23 03/24/24  Mallipeddi, Vishnu P, MD  Azelastine HCl 137 MCG/SPRAY SOLN Place 2 puffs into the nose 2 (two) times daily as needed (congestion).    [provider]  blood glucose meter kit and supplies KIT Dispense based on patient and insurance preference. ( One touch ) diabetes type 2. E11.9. Test blood sugar once a day 04/08/16   Thomas Elsie RAMAN, MD  clopidogrel  (PLAVIX ) 75 MG tablet Take 1 tablet (75 mg total) by mouth daily with breakfast. 12/29/23   Mallipeddi, Vishnu P, MD  fluticasone  (FLONASE ) 50 MCG/ACT nasal spray Place 1 spray into both nostrils daily as needed for allergies or rhinitis. 02/18/23   [provider]  glucose blood test strip Use 1x per day as directed. 11/27/20   [name], DO  hydrOXYzine  (ATARAX ) 25 MG tablet Take one every 12 hours for anxiety or  sleep 10/21/23   Suzette Pac, MD  losartan  (COZAAR ) 25 MG tablet Take 1 tablet (25 mg total) by mouth 2 (two) times daily. 11/07/23   Mallipeddi, Vishnu P, MD  metFORMIN (GLUCOPHAGE) 500 MG tablet Take 500 mg by mouth daily. 05/05/22   [provider]  nitroGLYCERIN  (NITROSTAT ) 0.4 MG SL tablet Place 1 tablet (0.4 mg total) under the tongue every 5 (five) minutes as needed for chest pain. 10/12/23 10/11/24  Henry Manuelita NOVAK, NP  OneTouch Delica Lancets 33G MISC USE ONE  TO CHECK GLUCOSE ONCE DAILY 11/27/20   Waddell, Malena M, DO  silodosin  (RAPAFLO ) 8 MG CAPS capsule TAKE 1 CAPSULE BY MOUTH ONCE DAILY WITH BREAKFAST 09/08/21   McKenzie, Belvie CROME, MD    Allergies: Patient has no known allergies.    Review of Systems  Cardiovascular:  Positive for chest pain.    Updated Vital Signs BP (!) 150/90 (BP Location: Right Arm)   Pulse (!) 113   Resp 19   Ht 6' 2 (1.88 m)   Wt 89 kg   SpO2 100%   BMI 25.19 kg/m   Physical Exam  (all labs ordered are listed, but only abnormal results are displayed) Labs Reviewed  BASIC METABOLIC PANEL WITH GFR  CBC  TROPONIN T, HIGH SENSITIVITY    EKG: None  Radiology: No results found.  {Document cardiac monitor, telemetry assessment procedure when appropriate:32947} Procedures   Medications Ordered in the ED - No data to  display    {Click here for ABCD2, HEART and other calculators REFRESH Note before signing:1}                              Medical Decision Making Amount and/or Complexity of Data Reviewed Labs: ordered. Radiology: ordered.  Risk Prescription drug management.   ***  {Document critical care time when appropriate  Document review of labs and clinical decision tools ie CHADS2VASC2, etc  Document your independent review of radiology images and any outside records  Document your discussion with family members, caretakers and with  consultants  Document social determinants of health affecting pt's care  Document your decision making why or why not admission, treatments were needed:32947:::1}   Final diagnoses:  None    ED Discharge Orders     None

## 2024-01-25 NOTE — ED Notes (Signed)
 Patient transported to CT

## 2024-01-25 NOTE — ED Triage Notes (Signed)
 Pt c/o of feeling lightheaded and dizzy since July but states that yesterday it has gotten much worse. Pt states he has these episodes but this one has lasted longer than all the others.

## 2024-01-25 NOTE — Discharge Instructions (Signed)
 As discussed, please follow-up with your cardiologist and primary care provider.  Seek emergency care if experiencing any new or worsening symptoms.

## 2024-01-26 ENCOUNTER — Ambulatory Visit (HOSPITAL_BASED_OUTPATIENT_CLINIC_OR_DEPARTMENT_OTHER): Attending: Internal Medicine | Admitting: Cardiology

## 2024-01-26 ENCOUNTER — Encounter (HOSPITAL_COMMUNITY)

## 2024-01-26 VITALS — Ht 73.0 in | Wt 196.0 lb

## 2024-01-26 DIAGNOSIS — G4733 Obstructive sleep apnea (adult) (pediatric): Secondary | ICD-10-CM

## 2024-01-26 DIAGNOSIS — I493 Ventricular premature depolarization: Secondary | ICD-10-CM | POA: Diagnosis not present

## 2024-01-26 DIAGNOSIS — R42 Dizziness and giddiness: Secondary | ICD-10-CM | POA: Diagnosis not present

## 2024-01-29 ENCOUNTER — Encounter (HOSPITAL_COMMUNITY)

## 2024-01-30 ENCOUNTER — Encounter (HOSPITAL_COMMUNITY): Attending: Internal Medicine

## 2024-01-30 ENCOUNTER — Encounter

## 2024-01-30 DIAGNOSIS — I2510 Atherosclerotic heart disease of native coronary artery without angina pectoris: Secondary | ICD-10-CM

## 2024-01-30 DIAGNOSIS — R42 Dizziness and giddiness: Secondary | ICD-10-CM | POA: Insufficient documentation

## 2024-01-30 NOTE — Procedures (Signed)
  Indications for Polysomnography The patient is a 68 year-old Male who is 6' 1 and weighs 196.0 lbs. His BMI equals 26.0.  A full night polysomnogram was performed to evaluate for obstructive sleep apnea 0.  MedicationLow Dose AspirinAtorvastatin Polysomnogram Data A full night polysomnogram recorded the standard physiologic parameters including EEG, EOG, EMG, EKG, nasal and oral airflow.  Respiratory parameters of chest and abdominal movements were recorded with Respiratory Inductance Plethysmography belts.   Oxygen saturation was recorded by pulse oximetry.  Sleep Architecture The total recording time of the polysomnogram was 417.0 minutes.  The total sleep time was 240.0 minutes.  The patient spent 16.5% of total sleep time in Stage N1, 35.4% in Stage N2, 23.5% in Stages N3, and 24.6% in REM.  Sleep latency was 60.2 minutes.   REM latency was 170.5 minutes.  Sleep Efficiency was 57.6%.  Wake after Sleep Onset time was 117.0 minutes.  Respiratory Events The polysomnogram revealed a presence of 0 obstructive, 0 central, and 0 mixed apneas resulting in an Apnea index of 0 events per hour.  There were 24 hypopneas (GreaterEqual to3% desaturation and/or arousal) resulting in an Apnea\Hypopnea Index (AHI  GreaterEqual to3% desaturation and/or arousal) of 6.0 events per hour.  There were 1 hypopneas (GreaterEqual to4% desaturation) resulting in an Apnea\Hypopnea Index (AHI GreaterEqual to4% desaturation) of 0.3 events per hour.  There were 2 Respiratory  Effort Related Arousals resulting in a RERA index of 0.5 events per hour. The Respiratory Disturbance Index is 6.5 events per hour.  The snore index was 99.0 events per hour.  Mean oxygen saturation was 92.5%.  The lowest oxygen saturation during sleep was 88.0%.  Time spent LessEqual to88% oxygen saturation was  minutes.  Limb Activity There were 69 total limb movements recorded, of this total, 45 were classified as PLMs.  PLM index was 11.3 per  hour and PLM associated with Arousals index was 0.5 per hour.  Cardiac Summary The average pulse rate was 67.7 bpm.  The minimum pulse rate was 37.0 bpm while the maximum pulse rate was 91.0 bpm.  Cardiac rhythm was normal sinus rhythm with frequent PVCs  Diagnosis: Normal sleep study PVCs  Recommendations: 1. Normal study with no significant sleep disordered breathing. 2. Healthy sleep recommendations include: adequate nightly sleep (normal 7-9 hrs/night), avoidance of caffeine after noon and alcohol near bedtime, and maintaining a sleep environment that is cool, dark and quiet. 3. Weight loss for overweight patients is recommended. 4. Snoring recommendations include: weight loss where appropriate, side sleeping, and avoidance of alcohol before bed. 5. Operation of motor vehicle or dangerous equipment must be avoided when feeling drowsy, excessively sleepy, or mentally fatigued. 6. An ENT consultation which may be useful for specific causes of and possible treatment of bothersome snoring . 7. Weight loss may be of benefit in reducing the severity of snoring   This study was personally reviewed and electronically signed by: Wilbert Bihari, MD Accredited Board Certified in Sleep Medicine Date/Time: 01/30/2024 7:41 PM

## 2024-01-31 ENCOUNTER — Encounter (HOSPITAL_COMMUNITY)

## 2024-01-31 ENCOUNTER — Telehealth: Payer: Self-pay | Admitting: *Deleted

## 2024-01-31 NOTE — Telephone Encounter (Signed)
-----   Message from Thomas Summers sent at 01/30/2024  7:43 PM EDT ----- Please let patient know that sleep study showed no significant sleep apnea.

## 2024-01-31 NOTE — Telephone Encounter (Signed)
 The patient has been notified of the result via his mychart portal.

## 2024-02-02 ENCOUNTER — Encounter (HOSPITAL_COMMUNITY)

## 2024-02-05 ENCOUNTER — Ambulatory Visit: Payer: Self-pay | Admitting: Internal Medicine

## 2024-02-05 ENCOUNTER — Encounter (HOSPITAL_COMMUNITY)

## 2024-02-07 ENCOUNTER — Encounter (HOSPITAL_COMMUNITY)

## 2024-02-09 ENCOUNTER — Encounter (HOSPITAL_COMMUNITY)

## 2024-02-12 ENCOUNTER — Encounter (HOSPITAL_COMMUNITY)

## 2024-02-12 MED ORDER — REPATHA SURECLICK 140 MG/ML ~~LOC~~ SOAJ: 140.0000 mg | SUBCUTANEOUS | 2 refills | Status: DC

## 2024-02-13 ENCOUNTER — Telehealth: Payer: Self-pay | Admitting: Pharmacy Technician

## 2024-02-13 ENCOUNTER — Other Ambulatory Visit (HOSPITAL_COMMUNITY): Payer: Self-pay

## 2024-02-13 NOTE — Telephone Encounter (Signed)
 Pharmacy Patient Advocate Encounter   Received notification from Physician's Office that prior authorization for Repatha is required/requested.   Insurance verification completed.   The patient is insured through [facility] .   Per test claim: Refill too soon. PA is not needed at this time. Medication was filled 02/12/24. Next eligible fill date is 03/04/24.

## 2024-02-14 ENCOUNTER — Encounter (HOSPITAL_COMMUNITY)

## 2024-02-16 ENCOUNTER — Encounter (HOSPITAL_COMMUNITY)

## 2024-02-19 ENCOUNTER — Encounter (HOSPITAL_COMMUNITY)

## 2024-02-21 ENCOUNTER — Encounter (HOSPITAL_COMMUNITY)

## 2024-02-22 ENCOUNTER — Ambulatory Visit: Attending: Internal Medicine | Admitting: Internal Medicine

## 2024-02-22 ENCOUNTER — Encounter: Payer: Self-pay | Admitting: Internal Medicine

## 2024-02-22 VITALS — BP 134/80 | HR 79 | Ht 74.0 in | Wt 202.6 lb

## 2024-02-22 DIAGNOSIS — G629 Polyneuropathy, unspecified: Secondary | ICD-10-CM | POA: Diagnosis not present

## 2024-02-22 DIAGNOSIS — Z7902 Long term (current) use of antithrombotics/antiplatelets: Secondary | ICD-10-CM

## 2024-02-22 DIAGNOSIS — R42 Dizziness and giddiness: Secondary | ICD-10-CM

## 2024-02-22 DIAGNOSIS — I493 Ventricular premature depolarization: Secondary | ICD-10-CM

## 2024-02-22 MED ORDER — MEXILETINE HCL 150 MG PO CAPS: 150.0000 mg | ORAL_CAPSULE | Freq: Three times a day (TID) | ORAL | 3 refills | Status: DC

## 2024-02-22 NOTE — Progress Notes (Signed)
 Cardiology Office Note  Date: 02/22/2024   ID: Thomas Summers, Thomas Summers 12/04/1955, MRN [id number]  PCP:  Alston Silvio BROCKS, FNP  Cardiologist:  Osiris Odriscoll P Tamiko Leopard, MD Electrophysiologist:  None   History of Present Illness: Thomas Summers is a 68 y.o. male known to have HTN, DM 2, CAD s/p mid LAD PCI in 10/2023, new onset cardiomyopathy with LVEF 40 to 45%.  HLD is here for follow-up visit.  Initially referred to cardiology clinic for evaluation of elevated proBNP.  But he did not have any symptoms of DOE.  However he did have symptoms of dizziness/palpitations for which event monitor was performed that showed  22.9% PVC burden, 69 runs of NSVT (fastest lasting 10 beats and the longest lasting 18 beats).  Symptomatic with NSR, PAC, PVC, ventricular bigeminy and ventricular trigeminy.  He also had 4.9% PAC burden and 49 runs of nonsustained SVT.  He was started on metoprolol  tartrate 25 mg BID and eventually amiodarone  200 mg twice daily due to frequent PVCs on metoprolol .  Echocardiogram showed LVEF 40 to 45%, no valvular heart disease and CVP was 8 mmHg.  CT cardiac showed coronary artery calcium  score of 3167 (97th percentile for age and sex matched control), extensive total plaque volume and severe flow-limiting stenosis in the proximal to mid LAD and OM1.  He underwent PCI to mid LAD in July 2025.  Since then, he had multiple ER visits for dizziness.  Amiodarone  dose was increased due to frequent PVCs by Dr Alvan.  As he continued to report similar symptoms, it was thought to be side effect of amiodarone  and discontinued.  Currently not on metoprolol  and amiodarone .  He is here today for follow-up visit, accompanied by wife.  He reports that he had dizziness since PCI in July 2025.  EKG in August 2025 showed NSR.  No PVCs.  Event monitor in August 2025 showed 2% PVC burden (previous was 22.9% prior to PCI).  This was deemed to be noncardiac and other workup was pursued like CT head, MRI  brain, carotid Doppler, ultrasound arterial Doppler upper extremities excetra.  All workup has been unremarkable so far.  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  EKG performed in October 2025 (at the time of ER visit) showed NSR, frequent PVCs.  7 PVCs including couplets on EKG.  He underwent sleep apnea study that showed no evidence of OSA but had frequent PVCs.  He reports that he had even more worsening since September 2025.  His wife also noticed him jerking at night which is new.  No prior history of seizures.  Past Medical History:  Diagnosis Date   Allergy    Depression    Diabetes mellitus without complication (HCC)    Heart murmur    Hyperlipidemia    Hypertension     Past Surgical History:  Procedure Laterality Date   COLONOSCOPY N/A 08/13/2015   Procedure: COLONOSCOPY;  Surgeon: Claudis RAYMOND Rivet, MD;  [facility]: [facility];  Service: Endoscopy;  Laterality: N/A;  1200   COLONOSCOPY  08/13/2015   [facility] - Dr.Rehman   COLONOSCOPY N/A 01/14/2021   Procedure: COLONOSCOPY;  Surgeon: Rivet Claudis RAYMOND, MD;  Location: [facility];  Service: Endoscopy;  Laterality: N/A;  7:30   CORONARY STENT INTERVENTION N/A 10/11/2023   Procedure: CORONARY STENT INTERVENTION;  Surgeon: Verlin Lonni BIRCH, MD;  Location: [facility];  Service: Cardiovascular;  Laterality: N/A;   LEFT HEART CATH AND CORONARY ANGIOGRAPHY N/A 10/11/2023   Procedure: LEFT HEART CATH AND CORONARY ANGIOGRAPHY;  Surgeon: Verlin Lonni BIRCH, MD;  Location: [facility];  Service: Cardiovascular;  Laterality: N/A;   MENISCUS REPAIR Left 1997   OTHER SURGICAL HISTORY     left ear surgery   OTHER SURGICAL HISTORY     fracture arm   POLYPECTOMY  01/14/2021   Procedure: POLYPECTOMY;  Surgeon: Golda Claudis PENNER, MD;  Location: [facility];  Service: Endoscopy;;   TONSILLECTOMY      Current Outpatient Medications  Medication Sig  Dispense Refill   aspirin  EC 81 MG tablet Take 1 tablet (81 mg total) by mouth daily. Swallow whole. (Patient taking differently: Take 81 mg by mouth at bedtime. Swallow whole.) 30 tablet 5   Azelastine HCl 137 MCG/SPRAY SOLN Place 2 puffs into the nose 2 (two) times daily as needed (congestion).     blood glucose meter kit and supplies KIT Dispense based on patient and insurance preference. ( One touch ) diabetes type 2. E11.9. Test blood sugar once a day 1 each 5   clopidogrel  (PLAVIX ) 75 MG tablet Take 1 tablet (75 mg total) by mouth daily with breakfast. 90 tablet 3   Evolocumab (REPATHA SURECLICK) 140 MG/ML SOAJ Inject 140 mg into the skin every 14 (fourteen) days. 2 mL 2   fluticasone  (FLONASE ) 50 MCG/ACT nasal spray Place 1 spray into both nostrils daily as needed for allergies or rhinitis.     glucose blood test strip Use 1x per day as directed. 100 each 3   hydrOXYzine  (ATARAX ) 25 MG tablet Take one every 12 hours for anxiety or  sleep 20 tablet 1   losartan  (COZAAR ) 25 MG tablet Take 1 tablet (25 mg total) by mouth 2 (two) times daily. 60 tablet 3   metFORMIN (GLUCOPHAGE) 500 MG tablet Take 500 mg by mouth every evening.     nitroGLYCERIN  (NITROSTAT ) 0.4 MG SL tablet Place 1 tablet (0.4 mg total) under the tongue every 5 (five) minutes as needed for chest pain. 25 tablet 3   OneTouch Delica Lancets 33G MISC USE ONE  TO CHECK GLUCOSE ONCE DAILY 100 each 1   silodosin  (RAPAFLO ) 8 MG CAPS capsule TAKE 1 CAPSULE BY MOUTH ONCE DAILY WITH BREAKFAST 30 capsule 0   atorvastatin  (LIPITOR) 40 MG tablet Take 1 tablet (40 mg total) by mouth daily. (Patient not taking: Reported on 02/22/2024) 90 tablet 3   No current facility-administered medications for this visit.   Allergies:  Patient has no known allergies.   Social History: The patient  reports that he has never smoked. He has never used smokeless tobacco. He reports that he does not drink alcohol and does not use drugs.   Family History: The  patient's family history includes Crohn's disease in his daughter; Healthy in his brother, brother, daughter, mother, sister, and sister; Heart disease in his father; Hypertension in his father; Migraines in his daughter.   ROS:  Please see the history of present illness. Otherwise, complete review of systems is positive for none  All other systems are reviewed and negative.   Physical Exam: VS:  BP 134/80 (BP Location: Left Arm, Cuff Size: Normal)   Pulse 79   Ht 6' 2 (1.88 m)   Wt 202 lb 9.6 oz (91.9 kg)   SpO2 97%   BMI 26.01 kg/m , BMI Body mass index is 26.01 kg/m.  Wt Readings from Last 3  Encounters:  02/22/24 202 lb 9.6 oz (91.9 kg)  01/27/24 196 lb (88.9 kg)  01/25/24 196 lb 3.4 oz (89 kg)    General: Patient appears comfortable at rest. HEENT: Conjunctiva and lids normal, oropharynx clear with moist mucosa. Neck: Supple, no elevated JVP or carotid bruits, no thyromegaly. Lungs: Clear to auscultation, nonlabored breathing at rest. Cardiac: Regular rate and rhythm, no S3 or significant systolic murmur, no pericardial rub. Abdomen: Soft, nontender, no hepatomegaly, bowel sounds present, no guarding or rebound. Extremities: No pitting edema, distal pulses 2+. Skin: Warm and dry. Musculoskeletal: No kyphosis. Neuropsychiatric: Alert and oriented x3, affect grossly appropriate.  Recent Labwork: 10/16/2023: TSH 1.212 12/11/2023: B Natriuretic Peptide 34.0 01/25/2024: ALT 30; AST 22; BUN 27; Creatinine, Ser 0.67; Hemoglobin 15.3; Magnesium 2.3; Platelets 181; Potassium 4.3; Sodium 138     Component Value Date/Time   CHOL 109 10/12/2023 0800   CHOL 149 11/10/2020 0806   TRIG 66 10/12/2023 0800   HDL 49 10/12/2023 0800   HDL 63 11/10/2020 0806   CHOLHDL 2.2 10/12/2023 0800   VLDL 13 10/12/2023 0800   LDLCALC 47 10/12/2023 0800   LDLCALC 71 11/10/2020 0806    Assessment and Plan:  Dizziness Frequent PVCs - Started since PCI in July 2025 but actually worsened from  September 2025.  Wife also noticed him jerking at night which is new after his PCI.  Event monitor in August 2024 revealed 2% PVC burden (down from 22.9% prior to PCI) but EKG from October 2025 revealed NSR, frequent PVCs (7 PVCs including couplets).  PVCs have origin from anterior wall of LV.  Whereas prior to PCI, PVCs had origin from inferior wall of LV.  He also had new jerking at night since September 2025 due to frequent ??NSVT.  Sleep study negative for OSA but had frequent PVCs.  Obtain cardiac MRI and start mexiletine 150 mg 3 times daily. If he notices significant improvement in his symptoms after starting mexiletine, he will need urgent LHC to rule out LAD ischemia.  If LAD stent is patent, he will need EP referral.  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  Echo not repeated after PCI. - Continue DAPT, aspirin  81 mg once daily, Plavix  75 mg once daily. Recommendation per IC was to complete 1 year DAPT.  If he needs to undergo any time sensitive surgeries, will need to complete at least 6 months of uninterrupted DAPT. - Discontinued atorvastatin  and started Repatha .  Continue Repatha . - Will hold off on cardiac rehab until he is symptom-free.  Ischemic cardiomyopathy LVEF 40 to 45% - Echo not repeated after PCI.  Obtaining cardiac MRI, will follow-up on the LVEF. - On mexiletine, continue losartan  25 mg twice daily.  Will optimize GDMT after MRI is resulted.  HLD, at goal - LDL 47 in 2025.  Discontinued atorvastatin  due to frequent dizziness.  On Repatha .  HTN, controlled - Continue losartan  25 mg twice daily.  Will need to optimize GDMT after cardiac MRI results.  DM 2, controlled - On metformin, follow-up with PCP.  Peripheral neuropathy - Obtain ABI.  Reported having toe discoloration (on the second toe, not big toe).   30 minutes spent with patient  discussing his symptoms of dizziness, HTN, medications.  I answered all his questions.  10 minutes spent in documentation.     Medication Adjustments/Labs and  Tests Ordered: Current medicines are reviewed at length with the patient today.  Concerns regarding medicines are outlined above.    Disposition:  Follow up  6 weeks  Signed Maurissa Ambrose Priya Cayden Rautio, MD, 02/22/2024 11:39 AM    [facility] at [facility] [address] [place], [place], [place] [address]

## 2024-02-22 NOTE — Patient Instructions (Addendum)
 Medication Instructions:  Your physician has recommended you make the following change in your medication:   -Start Mexilitene 150 mg three times daily  *If you need a refill on your cardiac medications before your next appointment, please call your pharmacy*  Lab Work: None If you have labs (blood work) drawn today and your tests are completely normal, you will receive your results only by: MyChart Message (if you have MyChart) OR A paper copy in the mail If you have any lab test that is abnormal or we need to change your treatment, we will call you to review the results.  Testing/Procedures: Cardiac MRI  Your physician has requested that you have an ankle brachial index (ABI). During this test an ultrasound and blood pressure cuff are used to evaluate the arteries that supply the arms and legs with blood. Allow thirty minutes for this exam. There are no restrictions or special instructions.  Please note: We ask at that you not bring children with you during ultrasound (echo/ vascular) testing. Due to room size and safety concerns, children are not allowed in the ultrasound rooms during exams. Our front office staff cannot provide observation of children in our lobby area while testing is being conducted. An adult accompanying a patient to their appointment will only be allowed in the ultrasound room at the discretion of the ultrasound technician under special circumstances. We apologize for any inconvenience.   Follow-Up: At [facility], you and your health needs are our priority.  As part of our continuing mission to provide you with exceptional heart care, our providers are all part of one team.  This team includes your primary Cardiologist (physician) and Advanced Practice Providers or APPs (Physician Assistants and Nurse Practitioners) who all work together to provide you with the care you need, when you need it.  Your next appointment:   6 week(s)  Provider:   You may  see Vishnu P Mallipeddi, MD or the following Advanced Practice Provider on your designated Care Team:   Almarie Crate, NP    We recommend signing up for the patient portal called MyChart.  Sign up information is provided on this After Visit Summary.  MyChart is used to connect with patients for Virtual Visits (Telemedicine).  Patients are able to view lab/test results, encounter notes, upcoming appointments, etc.  Non-urgent messages can be sent to your provider as well.   To learn more about what you can do with MyChart, go to [email or web address].   Other Instructions     You are scheduled for Cardiac MRI at the location below.  Please arrive for your appointment at ______________ . ?  [facility] [address] [place], [place] [address] Please take advantage of the free valet parking available at the [facility] and [employer] (Entrance C).  Proceed to the [facility] Radiology Department (First Floor) for check-in.   Magnetic resonance imaging (MRI) is a painless test that produces images of the inside of the body without using Xrays.  During an MRI, strong magnets and radio waves work together in a data processing manager to form detailed images.   MRI images may provide more details about a medical condition than X-rays, CT scans, and ultrasounds can provide.  You may be given earphones to listen for instructions.  You may eat a light breakfast and take medications as ordered with the exception of furosemide, hydrochlorothiazide, chlorthalidone or spironolactone (or any other fluid pill). If you are undergoing a stress MRI, please avoid stimulants for  12 hr prior to test. (I.e. Caffeine, nicotine, chocolate, or antihistamine medications)  If your provider has ordered anti-anxiety medications for this test, then you will need a driver.  An IV will be inserted into one of your veins. Contrast material will be injected into your IV. It will leave your body through your  urine within a day. You may be told to drink plenty of fluids to help flush the contrast material out of your system.  You will be asked to remove all metal, including: Watch, jewelry, and other metal objects including hearing aids, hair pieces and dentures. Also wearable glucose monitoring systems (ie. Freestyle Libre and Omnipods) (Braces and fillings normally are not a problem.)   TEST WILL TAKE APPROXIMATELY 1 HOUR  PLEASE NOTIFY SCHEDULING AT LEAST 24 HOURS IN ADVANCE IF YOU ARE UNABLE TO KEEP YOUR APPOINTMENT. [phone]  For more information and frequently asked questions, please visit our website : [email or web address]  Please call the Cardiac Imaging Nurse Navigators with any questions/concerns. [phone] Office

## 2024-02-23 ENCOUNTER — Encounter (HOSPITAL_COMMUNITY)

## 2024-02-23 LAB — HEMOGLOBIN AND HEMATOCRIT, BLOOD
Hematocrit: 43.5 % (ref 37.5–51.0)
Hemoglobin: 14.6 g/dL (ref 13.0–17.7)

## 2024-02-25 ENCOUNTER — Other Ambulatory Visit: Payer: Self-pay

## 2024-02-25 ENCOUNTER — Emergency Department (HOSPITAL_BASED_OUTPATIENT_CLINIC_OR_DEPARTMENT_OTHER)
Admission: EM | Admit: 2024-02-25 | Discharge: 2024-02-25 | Disposition: A | Discharge status: Home / Self Care | Attending: Emergency Medicine | Admitting: Emergency Medicine

## 2024-02-25 DIAGNOSIS — Z7901 Long term (current) use of anticoagulants: Secondary | ICD-10-CM | POA: Insufficient documentation

## 2024-02-25 DIAGNOSIS — R531 Weakness: Secondary | ICD-10-CM

## 2024-02-25 DIAGNOSIS — R030 Elevated blood-pressure reading, without diagnosis of hypertension: Secondary | ICD-10-CM

## 2024-02-25 DIAGNOSIS — Z7982 Long term (current) use of aspirin: Secondary | ICD-10-CM | POA: Insufficient documentation

## 2024-02-25 DIAGNOSIS — I10 Essential (primary) hypertension: Secondary | ICD-10-CM | POA: Diagnosis not present

## 2024-02-25 DIAGNOSIS — R5383 Other fatigue: Secondary | ICD-10-CM

## 2024-02-25 LAB — COMPREHENSIVE METABOLIC PANEL WITH GFR
ALT: 27 U/L (ref 0–44)
AST: 20 U/L (ref 15–41)
Albumin: 4.2 g/dL (ref 3.5–5.0)
Alkaline Phosphatase: 88 U/L (ref 38–126)
Anion gap: 13 (ref 5–15)
BUN: 23 mg/dL (ref 8–23)
CO2: 25 mmol/L (ref 22–32)
Calcium: 10 mg/dL (ref 8.9–10.3)
Chloride: 101 mmol/L (ref 98–111)
Creatinine, Ser: 0.7 mg/dL (ref 0.61–1.24)
GFR, Estimated: >60 mL/min (ref >60)
Glucose, Bld: 108 mg/dL — ABNORMAL HIGH (ref 70–99)
Potassium: 3.8 mmol/L (ref 3.5–5.1)
Sodium: 139 mmol/L (ref 135–145)
Total Bilirubin: 0.5 mg/dL (ref 0.0–1.2)
Total Protein: 7.6 g/dL (ref 6.5–8.1)

## 2024-02-25 LAB — CBC
HCT: 44.9 % (ref 39.0–52.0)
Hemoglobin: 15.4 g/dL (ref 13.0–17.0)
MCH: 32.2 pg (ref 26.0–34.0)
MCHC: 34.3 g/dL (ref 30.0–36.0)
MCV: 93.9 fL (ref 80.0–100.0)
Platelets: 217 K/uL (ref 150–400)
RBC: 4.78 MIL/uL (ref 4.22–5.81)
RDW: 15.5 % (ref 11.5–15.5)
WBC: 9.5 K/uL (ref 4.0–10.5)
nRBC: 0 % (ref 0.0–0.2)

## 2024-02-25 LAB — URINALYSIS, ROUTINE W REFLEX MICROSCOPIC
Bacteria, UA: NONE SEEN
Bilirubin Urine: NEGATIVE
Glucose, UA: NEGATIVE mg/dL
Hgb urine dipstick: NEGATIVE
Ketones, ur: NEGATIVE mg/dL
Leukocytes,Ua: NEGATIVE
Nitrite: NEGATIVE
Protein, ur: NEGATIVE mg/dL
Specific Gravity, Urine: 1.009 (ref 1.005–1.030)
pH: 6 (ref 5.0–8.0)

## 2024-02-25 LAB — TSH: TSH: 1.23 u[IU]/mL (ref 0.350–4.500)

## 2024-02-25 LAB — T4, FREE: Free T4: 0.86 ng/dL (ref 0.61–1.12)

## 2024-02-25 LAB — TROPONIN T, HIGH SENSITIVITY: Troponin T High Sensitivity: 19 ng/L (ref 0–19)

## 2024-02-25 NOTE — Discharge Instructions (Signed)
 It was our pleasure to provide your ER care today - we hope that you feel better.  Drink plenty of fluids/stay well hydrated. Eat balanced, heart healthy diet, get adequate nutrition.   Follow up closely with primary care doctor in  the next 1-2 weeks.   Return to ER if worse, new symptoms, fevers, new/severe pain, fainting, chest pain, trouble breathing, or other concern.

## 2024-02-25 NOTE — ED Provider Notes (Signed)
 Urinalysis negative TSH normal.  The CBC white count 9.5 hemoglobin 15.4.  Complete metabolic panel normal renal function normal LFTs normal.  Troponin normal at 19.  Patient should be stable for discharge home.   Chaslyn Eisen, MD 02/25/24 [phone]

## 2024-02-25 NOTE — ED Provider Notes (Signed)
 [facility] AT [facility] Provider Note   CSN: [id number] Arrival date & time: 02/25/24  1318     Patient presents with: Weakness   Thomas Summers is a 68 y.o. male.   Pt c/o generalized weakness, fatigued, loss of energy, which he feels has been present since cardiac stent placement in July of this year.  He has seen pcp, cardiology, ENT, and ED for same since - he indicates pcp and cardiology have adjusted any new meds started in July, and they do not feel is med related.   Prior workup has included cta and mri - negative for acute process, and multiple lab studies. Pt denies any new or recent change in speech or vision. No new numbness or any focal or unilateral numbness or weakness. No falls or problems w gait. No chest pain or sob. No cough or uri symptoms. No fever or chills. No wt loss, and indicates has gained a few lbs as not as active as normal. Denies acute problems w anxiety or depression.   The history is provided by the patient, medical records and a relative.  Weakness Associated symptoms: no abdominal pain, no chest pain, no cough, no diarrhea, no dysuria, no fever, no headaches, no shortness of breath and no vomiting        Prior to Admission medications   Medication Sig Start Date End Date Taking? Authorizing Provider  aspirin  EC 81 MG tablet Take 1 tablet (81 mg total) by mouth daily. Swallow whole. Patient taking differently: Take 81 mg by mouth at bedtime. Swallow whole. 10/04/23   Mallipeddi, Vishnu P, MD  atorvastatin  (LIPITOR) 40 MG tablet Take 1 tablet (40 mg total) by mouth daily. Patient not taking: Reported on 02/22/2024 12/25/23 03/24/24  Mallipeddi, Vishnu P, MD  Azelastine HCl 137 MCG/SPRAY SOLN Place 2 puffs into the nose 2 (two) times daily as needed (congestion).    [provider]  blood glucose meter kit and supplies KIT Dispense based on patient and insurance preference. ( One touch ) diabetes type 2. E11.9.  Test blood sugar once a day 04/08/16   [name], MD  clopidogrel  (PLAVIX ) 75 MG tablet Take 1 tablet (75 mg total) by mouth daily with breakfast. 12/29/23   Mallipeddi, Vishnu P, MD  Evolocumab  (REPATHA  SURECLICK) 140 MG/ML SOAJ Inject 140 mg into the skin every 14 (fourteen) days. 02/12/24   Mallipeddi, Vishnu P, MD  fluticasone  (FLONASE ) 50 MCG/ACT nasal spray Place 1 spray into both nostrils daily as needed for allergies or rhinitis. 02/18/23   [provider]  glucose blood test strip Use 1x per day as directed. 11/27/20   [name], DO  hydrOXYzine  (ATARAX ) 25 MG tablet Take one every 12 hours for anxiety or  sleep 10/21/23   Suzette Pac, MD  losartan  (COZAAR ) 25 MG tablet Take 1 tablet (25 mg total) by mouth 2 (two) times daily. 11/07/23   Mallipeddi, Vishnu P, MD  metFORMIN (GLUCOPHAGE) 500 MG tablet Take 500 mg by mouth every evening. 05/05/22   [provider]  mexiletine (MEXITIL) 150 MG capsule Take 1 capsule (150 mg total) by mouth 3 (three) times daily. 02/22/24   Mallipeddi, Vishnu P, MD  nitroGLYCERIN  (NITROSTAT ) 0.4 MG SL tablet Place 1 tablet (0.4 mg total) under the tongue every 5 (five) minutes as needed for chest pain. 10/12/23 10/11/24  Henry Manuelita NOVAK, NP  OneTouch Delica Lancets 33G MISC USE ONE  TO CHECK GLUCOSE ONCE DAILY 11/27/20  Waddell, Malena M, DO  silodosin  (RAPAFLO ) 8 MG CAPS capsule TAKE 1 CAPSULE BY MOUTH ONCE DAILY WITH BREAKFAST 09/08/21   McKenzie, Belvie CROME, MD    Allergies: Patient has no known allergies.    Review of Systems  Constitutional:  Negative for chills and fever.  HENT:  Negative for sore throat and trouble swallowing.   Eyes:  Negative for visual disturbance.  Respiratory:  Negative for cough and shortness of breath.   Cardiovascular:  Negative for chest pain and leg swelling.  Gastrointestinal:  Negative for abdominal pain, blood in stool, diarrhea and vomiting.  Genitourinary:  Negative for dysuria and flank pain.   Musculoskeletal:  Negative for back pain and neck pain.  Skin:  Negative for rash.  Neurological:  Positive for weakness. Negative for syncope, speech difficulty and headaches.  Psychiatric/Behavioral:  Negative for confusion.     Updated Vital Signs BP (!) 150/85   Pulse 91   Temp 98 F (36.7 C) (Oral)   Resp 16   SpO2 98%   Physical Exam Vitals and nursing note reviewed.  Constitutional:      Appearance: Normal appearance. He is well-developed.  HENT:     Head: Atraumatic.     Nose: Nose normal.     Mouth/Throat:     Mouth: Mucous membranes are moist.     Pharynx: Oropharynx is clear.  Eyes:     General: No scleral icterus.    Conjunctiva/sclera: Conjunctivae normal.     Pupils: Pupils are equal, round, and reactive to light.  Neck:     Trachea: No tracheal deviation.     Comments: Trachea midline, thyroid  not grossly enlarged or tender. No bruits.  Cardiovascular:     Rate and Rhythm: Normal rate and regular rhythm.     Pulses: Normal pulses.     Heart sounds: Normal heart sounds. No murmur heard.    No friction rub. No gallop.  Pulmonary:     Effort: Pulmonary effort is normal. No accessory muscle usage or respiratory distress.     Breath sounds: Normal breath sounds.  Abdominal:     General: Bowel sounds are normal. There is no distension.     Palpations: Abdomen is soft. There is no mass.     Tenderness: There is no abdominal tenderness. There is no guarding.  Genitourinary:    Comments: No cva tenderness. Musculoskeletal:        General: No swelling or tenderness.     Cervical back: Normal range of motion and neck supple. No rigidity.     Right lower leg: No edema.     Left lower leg: No edema.  Skin:    General: Skin is warm and dry.     Findings: No rash.  Neurological:     Mental Status: He is alert.     Comments: Alert, speech clear. No dysarthria or aphasia. Motor/sens fxn grossly intact bil. No pronator drift. Stre 5/5 bil. Sens grossly intact.  Finger to nose and heel to shin wnl bilaterally. Ambulates w steady gait, no ataxia. Romberg unremarkable.   Psychiatric:        Mood and Affect: Mood normal.     (all labs ordered are listed, but only abnormal results are displayed) Results for orders placed or performed during the hospital encounter of 02/25/24  CBC   Collection Time: 02/25/24  2:22 PM  Result Value Ref Range   WBC 9.5 4.0 - 10.5 K/uL   RBC 4.78 4.22 - 5.81 MIL/uL  Hemoglobin 15.4 13.0 - 17.0 g/dL   HCT 55.0 60.9 - 47.9 %   MCV 93.9 80.0 - 100.0 fL   MCH 32.2 26.0 - 34.0 pg   MCHC 34.3 30.0 - 36.0 g/dL   RDW 84.4 88.4 - 84.4 %   Platelets 217 150 - 400 K/uL   nRBC 0.0 0.0 - 0.2 %  Comprehensive metabolic panel with GFR   Collection Time: 02/25/24  2:22 PM  Result Value Ref Range   Sodium 139 135 - 145 mmol/L   Potassium 3.8 3.5 - 5.1 mmol/L   Chloride 101 98 - 111 mmol/L   CO2 25 22 - 32 mmol/L   Glucose, Bld 108 (H) 70 - 99 mg/dL   BUN 23 8 - 23 mg/dL   Creatinine, Ser 9.29 0.61 - 1.24 mg/dL   Calcium  10.0 8.9 - 10.3 mg/dL   Total Protein 7.6 6.5 - 8.1 g/dL   Albumin 4.2 3.5 - 5.0 g/dL   AST 20 15 - 41 U/L   ALT 27 0 - 44 U/L   Alkaline Phosphatase 88 38 - 126 U/L   Total Bilirubin 0.5 0.0 - 1.2 mg/dL   GFR, Estimated >39 >39 mL/min   Anion gap 13 5 - 15  Troponin T, High Sensitivity   Collection Time: 02/25/24  2:22 PM  Result Value Ref Range   Troponin T High Sensitivity 19 0 - 19 ng/L   Sleep Study Documents Result Date: 02/09/2024 Ordered by an unspecified provider.  VAS US  UPPER EXTREMITY ARTERIAL DUPLEX Result Date: 02/01/2024  UPPER EXTREMITY DUPLEX STUDY Patient Name:  Thomas Summers  Date of Exam:   01/30/2024 Medical Rec #: [id number]             Accession #:    [id number] Date of Birth: [birth year]             Patient Gender: M Patient Age:   67 years Exam Location:  [facility] Procedure:      VAS US  UPPER EXTREMITY ARTERIAL DUPLEX Referring Phys: DIANNAH MALLIPEDDI  --------------------------------------------------------------------------------  Indications: Dizziness.  Risk Factors:  Hypertension, hyperlipidemia, Diabetes, no history of smoking,                coronary artery disease. Other Factors: PVC. Comparison Study: None. Performing Technologist: Bascom Burows RCS, RVS  Examination Guidelines: A complete evaluation includes B-mode imaging, spectral Doppler, color Doppler, and power Doppler as needed of all accessible portions of each vessel. Bilateral testing is considered an integral part of a complete examination. Limited examinations for reoccurring indications may be performed as noted.  Right Doppler Findings: +---------------+----------+---------+--------+--------+ Site           PSV (cm/s)Waveform StenosisComments +---------------+----------+---------+--------+--------+ Subclavian Prox127       triphasic                 +---------------+----------+---------+--------+--------+ Subclavian Mid 128       biphasic                  +---------------+----------+---------+--------+--------+ Subclavian Dist118       triphasic                 +---------------+----------+---------+--------+--------+ Axillary       91        triphasic                 +---------------+----------+---------+--------+--------+ Brachial Prox  76        triphasic                 +---------------+----------+---------+--------+--------+  Brachial Mid   102       triphasic                 +---------------+----------+---------+--------+--------+ Brachial Dist  85        triphasic                 +---------------+----------+---------+--------+--------+ Radial Prox    88        triphasic                 +---------------+----------+---------+--------+--------+ Radial Mid     97        triphasic                 +---------------+----------+---------+--------+--------+ Radial Dist    97        triphasic                  +---------------+----------+---------+--------+--------+ Ulnar Prox     109       triphasic                 +---------------+----------+---------+--------+--------+ Ulnar Mid      103       triphasic                 +---------------+----------+---------+--------+--------+ Ulnar Dist     96        triphasic                 +---------------+----------+---------+--------+--------+    Left Doppler Findings: +---------------+----------+---------+--------+--------+ Site           PSV (cm/s)Waveform StenosisComments +---------------+----------+---------+--------+--------+ Subclavian Prox166       triphasic                 +---------------+----------+---------+--------+--------+ Subclavian Mid 133       triphasic                 +---------------+----------+---------+--------+--------+ Subclavian Dist159       triphasic                 +---------------+----------+---------+--------+--------+ Axillary       88        triphasic                 +---------------+----------+---------+--------+--------+ Brachial Prox  100       triphasic                 +---------------+----------+---------+--------+--------+ Brachial Mid   92        triphasic                 +---------------+----------+---------+--------+--------+ Brachial Dist  84        triphasic                 +---------------+----------+---------+--------+--------+ Radial Prox    106       triphasic                 +---------------+----------+---------+--------+--------+ Radial Mid     123       triphasic                 +---------------+----------+---------+--------+--------+ Radial Dist    120       triphasic                 +---------------+----------+---------+--------+--------+ Ulnar Prox     121       triphasic                 +---------------+----------+---------+--------+--------+ Ulnar Mid      120  triphasic                  +---------------+----------+---------+--------+--------+ Ulnar Dist     128       triphasic                 +---------------+----------+---------+--------+--------+   Summary:  Right: No obstruction visualized in the right upper extremity Mild        atherosclerotic plaque noted. Not hemodynamicaaly        significant. Left: No obstruction visualized in the left upper extremity Mild       atherosclerotic plaque noted. Not hemodynamicaaly significant. *See table(s) above for measurements and observations. Electronically signed by Deatrice Cage MD on 02/01/2024 at 1:03:00 PM.    Final    VAS US  CAROTID Result Date: 02/01/2024 Carotid Arterial Duplex Study Patient Name:  Thomas Summers  Date of Exam:   01/30/2024 Medical Rec #: [id number]             Accession #:    [id number] Date of Birth: 1955/07/09             Patient Gender: M Patient Age:   55 years Exam Location:  [facility] Procedure:      VAS US  CAROTID Referring Phys: DIANNAH MAYWOOD --------------------------------------------------------------------------------  Indications:       Dizziness. Risk Factors:      Hypertension, hyperlipidemia, Diabetes, no history of                    smoking, coronary artery disease. Other Factors:     CHF. Comparison Study:  None. Performing Technologist: Bascom Burows RCS, RVS  Examination Guidelines: A complete evaluation includes B-mode imaging, spectral Doppler, color Doppler, and power Doppler as needed of all accessible portions of each vessel. Bilateral testing is considered an integral part of a complete examination. Limited examinations for reoccurring indications may be performed as noted.  Right Carotid Findings: +----------+--------+--------+--------+------------------+--------+           PSV cm/sEDV cm/sStenosisPlaque DescriptionComments +----------+--------+--------+--------+------------------+--------+ CCA Prox  93      18                                          +----------+--------+--------+--------+------------------+--------+ CCA Distal94      16                                         +----------+--------+--------+--------+------------------+--------+ ICA Prox  72      17      1-39%   heterogenous               +----------+--------+--------+--------+------------------+--------+ ICA Mid   86      25                                         +----------+--------+--------+--------+------------------+--------+ ICA Distal91      24                                         +----------+--------+--------+--------+------------------+--------+ ECA       137                                                +----------+--------+--------+--------+------------------+--------+ +----------+--------+-------+----------------+-------------------+  PSV cm/sEDV cmsDescribe        Arm Pressure (mmHG) +----------+--------+-------+----------------+-------------------+ [id number]            Multiphasic, [id number]                 +----------+--------+-------+----------------+-------------------+ +---------+--------+--+--------+---------+ VertebralPSV cm/s53EDV cm/sAntegrade +---------+--------+--+--------+---------+  Left Carotid Findings: +----------+--------+--------+--------+------------------+--------+           PSV cm/sEDV cm/sStenosisPlaque DescriptionComments +----------+--------+--------+--------+------------------+--------+ CCA Prox  135     20                                         +----------+--------+--------+--------+------------------+--------+ CCA Distal79      16                                         +----------+--------+--------+--------+------------------+--------+ ICA Prox  105     24      1-39%   heterogenous               +----------+--------+--------+--------+------------------+--------+ ICA Mid   94      24                                          +----------+--------+--------+--------+------------------+--------+ ICA Distal80      11                                         +----------+--------+--------+--------+------------------+--------+ ECA       157                                                +----------+--------+--------+--------+------------------+--------+ +----------+--------+--------+----------------+-------------------+           PSV cm/sEDV cm/sDescribe        Arm Pressure (mmHG) +----------+--------+--------+----------------+-------------------+ [id number]             Multiphasic, TWO861                 +----------+--------+--------+----------------+-------------------+ +---------+--------+--+--------+---------+ VertebralPSV cm/s55EDV cm/sAntegrade +---------+--------+--+--------+---------+   Summary: Right Carotid: Velocities in the right ICA are consistent with a 1-39% stenosis.                Non-hemodynamically significant plaque <50% noted in the CCA. The                ECA appears <50% stenosed. Left Carotid: Velocities in the left ICA are consistent with a 1-39% stenosis.               Non-hemodynamically significant plaque <50% noted in the CCA. The               ECA appears <50% stenosed. Vertebrals:  Bilateral vertebral arteries demonstrate antegrade flow. Subclavians: Normal flow hemodynamics were seen in bilateral subclavian              arteries. *See table(s) above for measurements and observations.  Electronically signed by Deatrice Cage MD on 02/01/2024 at 12:55:07 PM.    Final     EKG: EKG Interpretation Date/Time:  Sunday February 25 2024 13:28:05 EST Ventricular Rate:  87 PR Interval:  147 QRS Duration:  137 QT Interval:  389 QTC Calculation: 468 R Axis:   -14  Text Interpretation: Sinus rhythm Premature ventricular complexes Right bundle branch block Non-specific ST-t changes Baseline wander Confirmed by Bernard Drivers ([id number]) on 02/25/2024 1:33:10 PM  Radiology: No results  found.   Procedures   Medications Ordered in the ED - No data to display                                  Medical Decision Making Problems Addressed: Elevated blood pressure reading: acute illness or injury Essential hypertension: chronic illness or injury with exacerbation, progression, or side effects of treatment that poses a threat to life or bodily functions Fatigue, unspecified type: chronic illness or injury Generalized weakness: acute illness or injury    Details: Acute/chronic  Amount and/or Complexity of Data Reviewed Independent Historian:     Details: Family, hx External Data Reviewed: labs, radiology and notes. Labs: ordered. Decision-making details documented in ED Course. Radiology: independent interpretation performed. Decision-making details documented in ED Course. ECG/medicine tests: ordered and independent interpretation performed. Decision-making details documented in ED Course.  Risk Decision regarding hospitalization.   Iv ns. Continuous pulse ox and cardiac monitoring. Labs ordered/sent.  Differential diagnosis includes anemia, dehydration, med side effect, etc. Dispo decision including potential need for admission considered - will get labs and review recent/prior imaging and reassess.   Reviewed nursing notes and prior charts for additional history. External reports reviewed. Additional history from:  Cardiac monitor: sinus rhythm, rate 88.  Labs reviewed/interpreted by me - wbc and hgb normal. Trop normal. Additional labs pending.   Prior MRI reviewed/interpreted by me - no cva.  1518 labs pending, signed out to Dr Zackwoski to check pending labs, recheck pt and dispo appropriately (anticipate if labs ok/normal, ok for d/c with close pcp f/u).       Final diagnoses:  Generalized weakness  Fatigue, unspecified type  Elevated blood pressure reading  Essential hypertension    ED Discharge Orders     None          Bernard Drivers,  MD 02/25/24 [phone]

## 2024-02-25 NOTE — ED Triage Notes (Addendum)
 Pt caox4 c/o increased weakness and dizziness over the past week, further reporting he had cardiac stent placed in July has had decreased energy just not feeling well since then. Also reporting he has felt shaky with tremor in hands today.

## 2024-02-26 ENCOUNTER — Encounter (HOSPITAL_COMMUNITY)

## 2024-02-27 ENCOUNTER — Ambulatory Visit: Admitting: Internal Medicine

## 2024-02-27 ENCOUNTER — Encounter (HOSPITAL_COMMUNITY): Payer: Self-pay

## 2024-02-28 ENCOUNTER — Ambulatory Visit (HOSPITAL_COMMUNITY)
Admission: RE | Admit: 2024-02-28 | Discharge: 2024-02-28 | Disposition: A | Discharge status: Home / Self Care | Source: Ambulatory Visit | Attending: Internal Medicine | Admitting: Internal Medicine

## 2024-02-28 ENCOUNTER — Encounter (HOSPITAL_COMMUNITY)

## 2024-02-28 DIAGNOSIS — G629 Polyneuropathy, unspecified: Secondary | ICD-10-CM | POA: Insufficient documentation

## 2024-03-01 ENCOUNTER — Other Ambulatory Visit: Payer: Self-pay | Admitting: Internal Medicine

## 2024-03-01 ENCOUNTER — Ambulatory Visit (HOSPITAL_COMMUNITY)
Admission: RE | Admit: 2024-03-01 | Discharge: 2024-03-01 | Disposition: A | Discharge status: Home / Self Care | Source: Ambulatory Visit | Attending: Internal Medicine | Admitting: Internal Medicine

## 2024-03-01 DIAGNOSIS — I493 Ventricular premature depolarization: Secondary | ICD-10-CM | POA: Diagnosis present

## 2024-03-01 MED ORDER — GADOBUTROL 1 MMOL/ML IV SOLN
12.0000 mL | Freq: Once | INTRAVENOUS | Status: AC | PRN
  Administered 2024-03-01: 12 mL via INTRAVENOUS

## 2024-03-04 ENCOUNTER — Encounter (HOSPITAL_COMMUNITY)

## 2024-03-06 ENCOUNTER — Encounter (HOSPITAL_COMMUNITY)

## 2024-03-08 ENCOUNTER — Encounter (HOSPITAL_COMMUNITY)

## 2024-03-11 ENCOUNTER — Other Ambulatory Visit (HOSPITAL_COMMUNITY): Payer: Self-pay | Admitting: Family Medicine

## 2024-03-11 ENCOUNTER — Ambulatory Visit: Payer: Self-pay | Admitting: Internal Medicine

## 2024-03-11 ENCOUNTER — Encounter (HOSPITAL_COMMUNITY)

## 2024-03-11 ENCOUNTER — Ambulatory Visit (HOSPITAL_COMMUNITY)
Admission: RE | Admit: 2024-03-11 | Discharge: 2024-03-11 | Disposition: A | Source: Ambulatory Visit | Attending: Family Medicine | Admitting: Family Medicine

## 2024-03-11 DIAGNOSIS — L089 Local infection of the skin and subcutaneous tissue, unspecified: Secondary | ICD-10-CM

## 2024-03-13 ENCOUNTER — Encounter (HOSPITAL_COMMUNITY)

## 2024-03-15 ENCOUNTER — Encounter (HOSPITAL_COMMUNITY)

## 2024-03-22 ENCOUNTER — Ambulatory Visit: Admitting: Neurology

## 2024-03-22 ENCOUNTER — Encounter: Payer: Self-pay | Admitting: Neurology

## 2024-03-22 DIAGNOSIS — F419 Anxiety disorder, unspecified: Secondary | ICD-10-CM

## 2024-03-22 DIAGNOSIS — R4189 Other symptoms and signs involving cognitive functions and awareness: Secondary | ICD-10-CM | POA: Diagnosis not present

## 2024-03-22 DIAGNOSIS — R42 Dizziness and giddiness: Secondary | ICD-10-CM | POA: Diagnosis not present

## 2024-03-22 MED ORDER — ESCITALOPRAM OXALATE 10 MG PO TABS: 10.0000 mg | ORAL_TABLET | Freq: Every day | ORAL | 0 refills | Status: AC

## 2024-03-22 NOTE — Patient Instructions (Signed)
 Continue current medications Trial of Lexapro 10 mg daily, can increase to 20 or 30 mg as indicated If trial of Lexapro not controlling the symptoms, then consider discussion with cardiologist regarding switching Plavix  to a different antiplatelet Continue follow-up PCP Return as needed

## 2024-03-22 NOTE — Progress Notes (Signed)
 "   [facility]  PATIENT: KEITON COSMA DOB: Jul 26, 1955  REQUESTING CLINICIAN: Bucio, Silvio BROCKS, FNP HISTORY FROM: Patient and spouse  REASON FOR VISIT: Dizziness    HISTORICAL  CHIEF COMPLAINT:  Chief Complaint  Patient presents with   New Patient (Initial Visit)    Pt in 13 with wife Pt here for dizziness Pt states fatigue all the time  Pt states stent placed 7/20205 Pt states symptoms started after stent placement     HISTORY OF PRESENT ILLNESS:  Discussed the use of AI scribe software for clinical note transcription with the patient, who gave verbal consent to proceed.  ROSHON DUELL is a 68 year old male with coronary artery disease who presents with persistent dizziness and fatigue following stent placement.  He has been experiencing persistent dizziness and fatigue since undergoing stent placement on October 11, 2023. The dizziness is described as a lack of clarity rather than a spinning sensation, accompanied by a 'pressure, numb kind of feeling' similar to sinus congestion. These symptoms fluctuate in intensity, sometimes requiring him to close his eyes and rest. The fatigue is significant, with physical activities leaving him exhausted for the remainder of the day, similar to the aftermath of having the flu. He has been unable to participate in cardiac rehabilitation due to these symptoms.  Since the stent placement, his medication regimen has changed. He was started on Plavix  and losartan , replacing lisinopril , and his statin was increased from 20 mg to 80 mg before being switched to Repatha . He has also been taken off metoprolol  and amiodarone . Despite these changes, his symptoms have persisted. He reports that his blood pressure, which was previously in the 120s, is now in the 130s to 140s range.  He has undergone extensive testing, including a CT scan, MRI, and carotid ultrasound, all of which returned normal results. He also reports experiencing  whole-body jerks during sleep, which his wife describes as occurring like a 'dripping faucet.' He has a history of restless leg syndrome, but the jerking is a new development.  He has a history of anxiety and panic attacks, with a significant episode in 2007 that required psychiatric intervention. He acknowledges that some of his current symptoms may be related to anxiety, especially given his previous experiences with panic attacks. He occasionally takes Atarax  for anxiety and sleep, but not regularly.  His past medical history includes hypertension and type 2 diabetes, which is well-controlled with diet, leading to the recent discontinuation of metformin. His family history is notable for longevity, with parents living into their late 68s and 97s. He maintains a healthy diet and has lost weight since March 2025, dropping from nearly 220 pounds to under 200 pounds.    OTHER MEDICAL CONDITIONS: CAD status post stent placement, Hypertension, hyperlipidemia, Anxiety, Obesity    REVIEW OF SYSTEMS: Full 14 system review of systems performed and negative with exception of: As noted in the HPI   ALLERGIES: Allergies[1]  HOME MEDICATIONS: Outpatient Medications Prior to Visit  Medication Sig Dispense Refill   aspirin  EC 81 MG tablet Take 1 tablet (81 mg total) by mouth daily. Swallow whole. (Patient taking differently: Take 81 mg by mouth at bedtime. Swallow whole.) 30 tablet 5   Azelastine HCl 137 MCG/SPRAY SOLN Place 2 puffs into the nose 2 (two) times daily as needed (congestion).     blood glucose meter kit and supplies KIT Dispense based on patient and insurance preference. ( One touch ) diabetes type 2. E11.9. Test  blood sugar once a day 1 each 5   clopidogrel  (PLAVIX ) 75 MG tablet Take 1 tablet (75 mg total) by mouth daily with breakfast. 90 tablet 3   Evolocumab  (REPATHA  SURECLICK) 140 MG/ML SOAJ Inject 140 mg into the skin every 14 (fourteen) days. 2 mL 2   fluticasone  (FLONASE ) 50 MCG/ACT  nasal spray Place 1 spray into both nostrils daily as needed for allergies or rhinitis.     glucose blood test strip Use 1x per day as directed. 100 each 3   hydrOXYzine  (ATARAX ) 25 MG tablet Take one every 12 hours for anxiety or  sleep 20 tablet 1   losartan  (COZAAR ) 25 MG tablet Take 1 tablet (25 mg total) by mouth 2 (two) times daily. 60 tablet 3   mexiletine (MEXITIL) 150 MG capsule Take 1 capsule (150 mg total) by mouth 3 (three) times daily. 270 capsule 3   nitroGLYCERIN  (NITROSTAT ) 0.4 MG SL tablet Place 1 tablet (0.4 mg total) under the tongue every 5 (five) minutes as needed for chest pain. 25 tablet 3   OneTouch Delica Lancets 33G MISC USE ONE  TO CHECK GLUCOSE ONCE DAILY 100 each 1   silodosin  (RAPAFLO ) 8 MG CAPS capsule TAKE 1 CAPSULE BY MOUTH ONCE DAILY WITH BREAKFAST 30 capsule 0   atorvastatin  (LIPITOR) 40 MG tablet Take 1 tablet (40 mg total) by mouth daily. (Patient not taking: Reported on 02/22/2024) 90 tablet 3   metFORMIN (GLUCOPHAGE) 500 MG tablet Take 500 mg by mouth every evening.     No facility-administered medications prior to visit.    PAST MEDICAL HISTORY: Past Medical History:  Diagnosis Date   Allergy    Depression    Diabetes mellitus without complication (HCC)    Heart murmur    Hyperlipidemia    Hypertension     PAST SURGICAL HISTORY: Past Surgical History:  Procedure Laterality Date   COLONOSCOPY N/A 08/13/2015   Procedure: COLONOSCOPY;  Surgeon: Claudis RAYMOND Rivet, MD;  Location: [facility];  Service: Endoscopy;  Laterality: N/A;  1200   COLONOSCOPY  08/13/2015   [facility] - Dr.Rehman   COLONOSCOPY N/A 01/14/2021   Procedure: COLONOSCOPY;  Surgeon: Rivet Claudis RAYMOND, MD;  Location: [facility];  Service: Endoscopy;  Laterality: N/A;  7:30   CORONARY STENT INTERVENTION N/A 10/11/2023   Procedure: CORONARY STENT INTERVENTION;  Surgeon: Verlin Lonni BIRCH, MD;  Location: [facility];  Service: Cardiovascular;  Laterality: N/A;   LEFT HEART  CATH AND CORONARY ANGIOGRAPHY N/A 10/11/2023   Procedure: LEFT HEART CATH AND CORONARY ANGIOGRAPHY;  Surgeon: Verlin Lonni BIRCH, MD;  Location: [facility];  Service: Cardiovascular;  Laterality: N/A;   MENISCUS REPAIR Left 1997   OTHER SURGICAL HISTORY     left ear surgery   OTHER SURGICAL HISTORY     fracture arm   POLYPECTOMY  01/14/2021   Procedure: POLYPECTOMY;  Surgeon: Rivet Claudis RAYMOND, MD;  Location: [facility];  Service: Endoscopy;;   TONSILLECTOMY      FAMILY HISTORY: Family History  Problem Relation Age of Onset   Hypertension Father    Heart disease Father    Healthy Mother    Healthy Sister    Healthy Brother    Healthy Sister    Healthy Brother    Crohn's disease Daughter    Migraines Daughter    Healthy Daughter     SOCIAL HISTORY: Social History   Socioeconomic History   Marital status: Married    Spouse  name: Not on file   Number of children: Not on file   Years of education: Not on file   Highest education level: Not on file  Occupational History   Not on file  Tobacco Use   Smoking status: Never   Smokeless tobacco: Never  Vaping Use   Vaping status: Never Used  Substance and Sexual Activity   Alcohol use: No   Drug use: No   Sexual activity: Not on file  Other Topics Concern   Not on file  Social History Narrative   Not on file   Social Drivers of Health   Tobacco Use: Low Risk (03/22/2024)   Patient History    Smoking Tobacco Use: Never    Smokeless Tobacco Use: Never    Passive Exposure: Not on file  Financial Resource Strain: Not on file  Food Insecurity: No Food Insecurity (10/11/2023)   Epic    Worried About Programme Researcher, Broadcasting/film/video in the Last Year: Never true    Ran Out of Food in the Last Year: Never true  Transportation Needs: No Transportation Needs (10/11/2023)   Epic    Lack of Transportation (Medical): No    Lack of Transportation (Non-Medical): No  Physical Activity: Not on file  Stress: Not on file  Social  Connections: Socially Integrated (10/11/2023)   Social Connection and Isolation Panel    Frequency of Communication with Friends and Family: Three times a week    Frequency of Social Gatherings with Friends and Family: Twice a week    Attends Religious Services: 1 to 4 times per year    Active Member of [employer] or Organizations: No    Attends Banker Meetings: 1 to 4 times per year    Marital Status: Married  Catering Manager Violence: Not At Risk (10/11/2023)   Epic    Fear of Current or Ex-Partner: No    Emotionally Abused: No    Physically Abused: No    Sexually Abused: No  Depression (PHQ2-9): Low Risk (11/14/2023)   Depression (PHQ2-9)    PHQ-2 Score: 4  Alcohol Screen: Not on file  Housing: Low Risk (10/11/2023)   Epic    Unable to Pay for Housing in the Last Year: No    Number of Times Moved in the Last Year: 0    Homeless in the Last Year: No  Utilities: Not At Risk (10/11/2023)   Epic    Threatened with loss of utilities: No  Health Literacy: Not on file    PHYSICAL EXAM  GENERAL EXAM/CONSTITUTIONAL: Vitals: There were no vitals filed for this visit. There is no height or weight on file to calculate BMI. Wt Readings from Last 3 Encounters:  02/22/24 202 lb 9.6 oz (91.9 kg)  01/27/24 196 lb (88.9 kg)  01/25/24 196 lb 3.4 oz (89 kg)   Patient is in no distress; well developed, nourished and groomed; neck is supple  MUSCULOSKELETAL: Gait, strength, tone, movements noted in Neurologic exam below  NEUROLOGIC: MENTAL STATUS:      No data to display         awake, alert, oriented to person, place and time recent and remote memory intact normal attention and concentration language fluent, comprehension intact, naming intact fund of knowledge appropriate  CRANIAL NERVE:  2nd, 3rd, 4th, 6th - Visual fields full to confrontation, extraocular muscles intact, no nystagmus 5th - facial sensation symmetric 7th - facial strength symmetric 8th - hearing  intact 9th - palate elevates symmetrically, uvula midline  11th - shoulder shrug symmetric 12th - tongue protrusion midline  MOTOR:  normal bulk and tone, full strength in the BUE, BLE  SENSORY:  normal and symmetric to light touch  COORDINATION:  finger-nose-finger, fine finger movements normal  GAIT/STATION:  normal   DIAGNOSTIC DATA (LABS, IMAGING, TESTING) - I reviewed patient records, labs, notes, testing and imaging myself where available.  Lab Results  Component Value Date   WBC 9.5 02/25/2024   HGB 15.4 02/25/2024   HCT 44.9 02/25/2024   MCV 93.9 02/25/2024   PLT 217 02/25/2024      Component Value Date/Time   NA 139 02/25/2024 1422   NA 139 10/04/2023 1354   K 3.8 02/25/2024 1422   CL 101 02/25/2024 1422   CO2 25 02/25/2024 1422   GLUCOSE 108 (H) 02/25/2024 1422   BUN 23 02/25/2024 1422   BUN 19 10/04/2023 1354   CREATININE 0.70 02/25/2024 1422   CALCIUM  10.0 02/25/2024 1422   PROT 7.6 02/25/2024 1422   PROT 7.1 11/10/2020 0806   ALBUMIN 4.2 02/25/2024 1422   ALBUMIN 4.9 (H) 11/10/2020 0806   AST 20 02/25/2024 1422   ALT 27 02/25/2024 1422   ALKPHOS 88 02/25/2024 1422   BILITOT 0.5 02/25/2024 1422   BILITOT 0.4 11/10/2020 0806   GFRNONAA >60 02/25/2024 1422   GFRAA 104 12/18/2019 0812   Lab Results  Component Value Date   CHOL 109 10/12/2023   HDL 49 10/12/2023   LDLCALC 47 10/12/2023   TRIG 66 10/12/2023   CHOLHDL 2.2 10/12/2023   Lab Results  Component Value Date   HGBA1C 5.6 10/12/2023   No results found for: VITAMINB12 Lab Results  Component Value Date   TSH 1.230 02/25/2024    MRI Brain 12/11/2023 1. No acute intracranial abnormality.    ASSESSMENT AND PLAN  68 y.o. year old male with    Chronic fatigue and dizziness following coronary stent placement Persistent fatigue and dizziness since coronary stent placement in July. Symptoms include a non-clear sensation, fatigue, and difficulty with physical activity. Extensive  cardiac workup including MRI, carotid studies, and cardiac evaluations have been normal. Differential includes medication side effects, possibly Plavix  intolerance but less likely, and anxiety. No evidence of vertigo or significant cardiac issues. Symptoms may be exacerbated by anxiety and stress. - Initiated trial of Lexapro  10 mg daily for anxiety, with potential increase to 20-30 mg if needed. - If Lexapro  not helpful with the symptoms, then, discuss with cardiologist about potential intolerance to Plavix  and consider alternative antiplatelet therapy if possible. - Encouraged regular physical activity and gradual return to cardiac rehabilitation.  Anxiety disorder Anxiety may be contributing to fatigue and dizziness. History of panic attacks and previous psychiatric treatment. Current symptoms may be exacerbated by recent cardiac events and medication changes. - Initiated Lexapro  10 mg daily for anxiety management. - Continue Atarax  as needed for acute anxiety episodes. - Encouraged follow-up with primary care for ongoing anxiety management.    1. Dizziness   2. Brain fog   3. Anxiety      Patient Instructions  Continue current medications Trial of Lexapro  10 mg daily, can increase to 20 or 30 mg as indicated If trial of Lexapro  not controlling the symptoms, then consider discussion with cardiologist regarding switching Plavix  to a different antiplatelet Continue follow-up PCP Return as needed  No orders of the defined types were placed in this encounter.   Meds ordered this encounter  Medications   escitalopram  (LEXAPRO ) 10 MG tablet  Sig: Take 1 tablet (10 mg total) by mouth daily.    Dispense:  30 tablet    Refill:  0    Return if symptoms worsen or fail to improve.  I personally spent a total of 60 minutes in the care of the patient today including preparing to see the patient, getting/reviewing separately obtained history, performing a medically appropriate  exam/evaluation, counseling and educating, placing orders, documenting clinical information in the EHR, and spending additional time discussing the possibility of these symptoms to be related to anxiety following his cardiac stent placement.   Pastor Falling, MD 03/22/2024, 1:24 PM  [facility] [address] [place], [place] [address] [phone]     [1] No Known Allergies  "

## 2024-04-11 ENCOUNTER — Encounter: Payer: Self-pay | Admitting: Internal Medicine

## 2024-04-11 ENCOUNTER — Ambulatory Visit: Attending: Internal Medicine | Admitting: Internal Medicine

## 2024-04-11 ENCOUNTER — Telehealth: Payer: Self-pay

## 2024-04-11 VITALS — BP 140/76 | HR 86 | Ht 74.0 in | Wt 202.8 lb

## 2024-04-11 DIAGNOSIS — I493 Ventricular premature depolarization: Secondary | ICD-10-CM

## 2024-04-11 MED ORDER — METOPROLOL SUCCINATE ER 25 MG PO TB24: 12.5000 mg | ORAL_TABLET | Freq: Every day | ORAL | 3 refills | Status: AC

## 2024-04-11 NOTE — Telephone Encounter (Signed)
 After patient had left appointment: Per Dr. Mallipeddi made the following changes:  F/u 3 months Start metoprolol  succinate 12.5 mg once daily Due to frequent PVCs on the EKG As we are removing Mexilitine  Called patient to make him aware. Patient verbalized understanding

## 2024-04-11 NOTE — Patient Instructions (Addendum)
 Medication Instructions:  Your physician has recommended you make the following change in your medication:  Stop taking Mexiletine  Hold Plavix  for 1 week and call or send an update on MyChart regarding your symptoms  Continue taking all other medications as prescribed   Labwork: None  Testing/Procedures: None  Follow-Up: Your physician recommends that you schedule a follow-up appointment in: 6 months  Any Other Special Instructions Will Be Listed Below (If Applicable). Thank you for choosing [facility]!     If you need a refill on your cardiac medications before your next appointment, please call your pharmacy.

## 2024-04-11 NOTE — Progress Notes (Signed)
 "    Cardiology Office Note  Date: 04/11/2024   ID: Thomas Summers, DOB September 16, 1955, MRN [id number]  PCP:  Thomas Silvio BROCKS, FNP  Cardiologist:  Thomas Omdahl P Chloe Bluett, MD Electrophysiologist:  None   History of Present Illness: Thomas Summers is a 69 y.o. male known to have HTN, DM 2, CAD s/p mid LAD PCI in 10/2023, new onset cardiomyopathy with LVEF 40 to 45%.  HLD is here for follow-up visit.  Initially referred to cardiology clinic for evaluation of elevated proBNP.  But he did not have any symptoms of DOE.  However he did have symptoms of dizziness/palpitations for which event monitor was performed that showed  22.9% PVC burden, 69 runs of NSVT (fastest lasting 10 beats and the longest lasting 18 beats).  Symptomatic with NSR, PAC, PVC, ventricular bigeminy and ventricular trigeminy.  He also had 4.9% PAC burden and 49 runs of nonsustained SVT.  He was started on metoprolol  tartrate 25 mg BID and eventually amiodarone  200 mg twice daily due to frequent PVCs on metoprolol .  Echocardiogram showed LVEF 40 to 45%, no valvular heart disease and CVP was 8 mmHg.  CT cardiac showed coronary artery calcium  score of 3167 (97th percentile for age and sex matched control), extensive total plaque volume and severe flow-limiting stenosis in the proximal to mid LAD and OM1.  He underwent PCI to mid LAD in July 2025.  Since then, he had multiple ER visits for dizziness.  Amiodarone  dose was increased due to frequent PVCs by Dr Alvan.  As he continued to report similar symptoms, it was thought to be side effect of amiodarone  and discontinued.  Currently not on metoprolol  and amiodarone .  He is here today for follow-up visit, accompanied by wife.  Nov 2025: He reports that he had dizziness since PCI in July 2025.  EKG in August 2025 showed NSR.  No PVCs.  Event monitor in August 2025 showed 2% PVC burden (previous was 22.9% prior to PCI).  This was deemed to be noncardiac and other workup was pursued like CT  head, MRI brain, carotid Doppler, ultrasound arterial Doppler upper extremities excetra.  All workup has been unremarkable so far.  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  EKG performed in October 2025 (at the time of ER visit) showed NSR, frequent PVCs.  7 PVCs including couplets on EKG.  He underwent sleep apnea study that showed no evidence of OSA but had frequent PVCs.  He reports that he had even more worsening since September 2025.  His wife also noticed him jerking at night which is new.  No prior history of seizures.  He had neurology visit recently, started Lexapro .  Noticed some improvement.  But he did not notice any improvement after starting mexiletine.  EKG today showed frequent PVCs, NSR.  No chest pain, DOE.  Continues to have dizziness but improved.  No syncope.  Past Medical History:  Diagnosis Date   Allergy    Depression    Diabetes mellitus without complication (HCC)    Heart murmur    Hyperlipidemia    Hypertension     Past Surgical History:  Procedure Laterality Date   COLONOSCOPY N/A 08/13/2015   Procedure: COLONOSCOPY;  Surgeon: Thomas RAYMOND Rivet, MD;  Location: [facility];  Service: Endoscopy;  Laterality: N/A;  1200   COLONOSCOPY  08/13/2015   [facility] - Dr.Rehman   COLONOSCOPY N/A 01/14/2021  Procedure: COLONOSCOPY;  Surgeon: Thomas Thomas PENNER, MD;  Location: [facility];  Service: Endoscopy;  Laterality: N/A;  7:30   CORONARY STENT INTERVENTION N/A 10/11/2023   Procedure: CORONARY STENT INTERVENTION;  Surgeon: Thomas Lonni BIRCH, MD;  Location: [facility];  Service: Cardiovascular;  Laterality: N/A;   LEFT HEART CATH AND CORONARY ANGIOGRAPHY N/A 10/11/2023   Procedure: LEFT HEART CATH AND CORONARY ANGIOGRAPHY;  Surgeon: Thomas Lonni BIRCH, MD;  Location: [facility];  Service: Cardiovascular;  Laterality: N/A;   MENISCUS REPAIR Left 1997   OTHER SURGICAL HISTORY      left ear surgery   OTHER SURGICAL HISTORY     fracture arm   POLYPECTOMY  01/14/2021   Procedure: POLYPECTOMY;  Surgeon: Thomas Thomas PENNER, MD;  Location: [facility];  Service: Endoscopy;;   TONSILLECTOMY      Current Outpatient Medications  Medication Sig Dispense Refill   aspirin  EC 81 MG tablet Take 1 tablet (81 mg total) by mouth daily. Swallow whole. 30 tablet 5   Azelastine HCl 137 MCG/SPRAY SOLN Place 2 puffs into the nose 2 (two) times daily as needed (congestion).     clopidogrel  (PLAVIX ) 75 MG tablet Take 1 tablet (75 mg total) by mouth daily with breakfast. 90 tablet 3   escitalopram  (LEXAPRO ) 10 MG tablet Take 1 tablet (10 mg total) by mouth daily. 30 tablet 0   Evolocumab  (REPATHA  SURECLICK) 140 MG/ML SOAJ Inject 140 mg into the skin every 14 (fourteen) days. 2 mL 2   fluticasone  (FLONASE ) 50 MCG/ACT nasal spray Place 1 spray into both nostrils daily as needed for allergies or rhinitis.     hydrOXYzine  (ATARAX ) 25 MG tablet Take one every 12 hours for anxiety or  sleep 20 tablet 1   losartan  (COZAAR ) 25 MG tablet Take 1 tablet (25 mg total) by mouth 2 (two) times daily. 60 tablet 3   mexiletine (MEXITIL) 150 MG capsule Take 1 capsule (150 mg total) by mouth 3 (three) times daily. 270 capsule 3   nitroGLYCERIN  (NITROSTAT ) 0.4 MG SL tablet Place 1 tablet (0.4 mg total) under the tongue every 5 (five) minutes as needed for chest pain. 25 tablet 3   silodosin  (RAPAFLO ) 8 MG CAPS capsule TAKE 1 CAPSULE BY MOUTH ONCE DAILY WITH BREAKFAST 30 capsule 0   blood glucose meter kit and supplies KIT Dispense based on patient and insurance preference. ( One touch ) diabetes type 2. E11.9. Test blood sugar once a day 1 each 5   glucose blood test strip Use 1x per day as directed. 100 each 3   OneTouch Delica Lancets 33G MISC USE ONE  TO CHECK GLUCOSE ONCE DAILY 100 each 1   No current facility-administered medications for this visit.   Allergies:  Patient has no known allergies.   Social  History: The patient  reports that he has never smoked. He has never used smokeless tobacco. He reports that he does not drink alcohol and does not use drugs.   Family History: The patient's family history includes Crohn's disease in his daughter; Healthy in his brother, brother, daughter, mother, sister, and sister; Heart disease in his father; Hypertension in his father; Migraines in his daughter.   ROS:  Please see the history of present illness. Otherwise, complete review of systems is positive for none  All other systems are reviewed and negative.   Physical Exam: VS:  BP (!) 140/76 (BP Location: Left Arm, Patient Position: Sitting, Cuff Size: Normal)  Pulse 86   Ht 6' 2 (1.88 m)   Wt 202 lb 12.8 oz (92 kg)   SpO2 98%   BMI 26.04 kg/m , BMI Body mass index is 26.04 kg/m.  Wt Readings from Last 3 Encounters:  04/11/24 202 lb 12.8 oz (92 kg)  02/22/24 202 lb 9.6 oz (91.9 kg)  01/27/24 196 lb (88.9 kg)    General: Patient appears comfortable at rest. HEENT: Conjunctiva and lids normal, oropharynx clear with moist mucosa. Neck: Supple, no elevated JVP or carotid bruits, no thyromegaly. Lungs: Clear to auscultation, nonlabored breathing at rest. Cardiac: Regular rate and rhythm, no S3 or significant systolic murmur, no pericardial rub. Abdomen: Soft, nontender, no hepatomegaly, bowel sounds present, no guarding or rebound. Extremities: No pitting edema, distal pulses 2+. Skin: Warm and dry. Musculoskeletal: No kyphosis. Neuropsychiatric: Alert and oriented x3, affect grossly appropriate.  Recent Labwork: 12/11/2023: B Natriuretic Peptide 34.0 01/25/2024: Magnesium 2.3 02/25/2024: ALT 27; AST 20; BUN 23; Creatinine, Ser 0.70; Hemoglobin 15.4; Platelets 217; Potassium 3.8; Sodium 139; TSH 1.230     Component Value Date/Time   CHOL 109 10/12/2023 0800   CHOL 149 11/10/2020 0806   TRIG 66 10/12/2023 0800   HDL 49 10/12/2023 0800   HDL 63 11/10/2020 0806   CHOLHDL 2.2 10/12/2023  0800   VLDL 13 10/12/2023 0800   LDLCALC 47 10/12/2023 0800   LDLCALC 71 11/10/2020 0806    Assessment and Plan:  Dizziness Frequent PVCs - Started since PCI in July 2025 but actually worsened from September 2025.  Wife also noticed him jerking at night which is new after his PCI.  Event monitor in August 2024 revealed 2% PVC burden (down from 22.9% prior to PCI) but EKG from October 2025 revealed NSR, frequent PVCs (7 PVCs including couplets).  PVCs have origin from anterior wall of LV.  Whereas prior to PCI, PVCs had origin from inferior wall of LV.  He also had new jerking at night since September 2025 due to frequent ??NSVT.  Sleep study negative for OSA but had frequent PVCs.  Cardiac MRI showed no evidence of myocardial scar.  Mexiletine did not improve his symptoms of dizziness and moreover EKG today showed NSR, frequent PVCs. Discontinue mexiletine.  Start metoprolol  succinate 12.5 mg once daily.  His dizziness slightly improved with Lexapro .  He is more active now.  Will continue to monitor.  Low threshold to offer LHC. - Hold Plavix  for 1 week and reassess his dizziness symptoms.  Per neurology note.  He already completed 12-month therapy of uninterrupted DAPT for SIHD.  Right subclavian artery stenosis - There is imaging evidence of 60% stenosis of the right subclavian artery.  But ultrasound carotid Doppler in October 2025 showed normal flow hemodynamics in the bilateral subclavian arteries and bilateral vertebral arteries demonstrated antegrade flow.  Will monitor.  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  Cardiac MRI after PCI showed LVEF 49%. - Continue DAPT, aspirin  81 mg once daily, Plavix  75 mg once daily. Recommendation per IC was to complete 1 year DAPT.  He completed 6 months of uninterrupted DAPT.  He can interrupt Plavix  therapy for his upcoming eye  surgery. - Did not tolerate atorvastatin .  Continue Repatha . - Will hold off on cardiac rehab until he is symptom-free.  Preop cardiac stratification for eye surgery - No angina or DOE.  Low risk.  Hold Plavix  for 5 to 7 days prior to surgery and resume after surgery when safe from ED standpoint.  Okay to continue aspirin  81 mg once daily throughout the perioperative period however if the bleeding risk is high, then okay to hold aspirin  prior to the procedure.  Ischemic cardiomyopathy LVEF 49% - Cardiac MRI showed LVEF 49% - Discontinue mexiletine (symptoms did not improve after starting mexiletine).  Metoprolol  had to be discontinued in the past due to dizziness.  Will restart metoprolol  succinate 12.5 mg once daily.  Continue losartan  25 mg twice daily.  HLD, at goal - LDL 47 in 2025.  Discontinued atorvastatin  due to frequent dizziness.  On Repatha .  HTN, controlled - Continue losartan  25 mg twice daily.  Previously on metoprolol  that was discontinued due to frequent dizziness.  Now the dizziness is improving, will restart metoprolol  succinate 12.5 mg once daily due to frequent PVCs.  DM 2, controlled - On metformin, follow-up with PCP.  Off metformin now per his PCP due to normalized HbA1c levels). (  Peripheral neuropathy - Reported having toe discoloration (on the second toe, not big toe).  ABI normal.   30 minutes spent with patient discussing his symptoms of dizziness, CAD, PVCs.I answered all his questions.  10 minutes spent in documentation.     Medication Adjustments/Labs and Tests Ordered: Current medicines are reviewed at length with the patient today.  Concerns regarding medicines are outlined above.    Disposition:  Follow up 3 months  Signed Olson Lucarelli Priya Gurkirat Basher, MD, 04/11/2024 9:51 AM    [facility] [address] [place], [place], [place] [address]  "

## 2024-04-18 ENCOUNTER — Other Ambulatory Visit: Payer: Self-pay | Admitting: Internal Medicine

## 2024-04-28 ENCOUNTER — Other Ambulatory Visit: Payer: Self-pay | Admitting: Internal Medicine

## 2024-07-04 ENCOUNTER — Ambulatory Visit: Admitting: Internal Medicine
